# Patient Record
Sex: Female | Born: 1935 | Race: White | Hispanic: No | Smoking: Former smoker
Health system: Southern US, Community
[De-identification: ages and names within clinical notes are randomized; demographics above are authoritative.]

## PROBLEM LIST (undated history)

## (undated) DIAGNOSIS — G319 Degenerative disease of nervous system, unspecified: Secondary | ICD-10-CM

## (undated) DIAGNOSIS — I10 Essential (primary) hypertension: Secondary | ICD-10-CM

## (undated) DIAGNOSIS — J42 Unspecified chronic bronchitis: Secondary | ICD-10-CM

## (undated) DIAGNOSIS — I4891 Unspecified atrial fibrillation: Secondary | ICD-10-CM

## (undated) DIAGNOSIS — I679 Cerebrovascular disease, unspecified: Secondary | ICD-10-CM

## (undated) DIAGNOSIS — C8120 Mixed cellularity classical Hodgkin lymphoma, unspecified site: Principal | ICD-10-CM

## (undated) DIAGNOSIS — K900 Celiac disease: Secondary | ICD-10-CM

## (undated) DIAGNOSIS — D62 Acute posthemorrhagic anemia: Secondary | ICD-10-CM

## (undated) DIAGNOSIS — F419 Anxiety disorder, unspecified: Secondary | ICD-10-CM

## (undated) DIAGNOSIS — I719 Aortic aneurysm of unspecified site, without rupture: Secondary | ICD-10-CM

## (undated) DIAGNOSIS — J479 Bronchiectasis, uncomplicated: Secondary | ICD-10-CM

## (undated) DIAGNOSIS — F411 Generalized anxiety disorder: Secondary | ICD-10-CM

## (undated) DIAGNOSIS — D649 Anemia, unspecified: Secondary | ICD-10-CM

## (undated) DIAGNOSIS — J189 Pneumonia, unspecified organism: Secondary | ICD-10-CM

## (undated) DIAGNOSIS — J449 Chronic obstructive pulmonary disease, unspecified: Secondary | ICD-10-CM

## (undated) HISTORY — PX: CATARACT EXTRACTION: SUR2

## (undated) HISTORY — DX: Chronic obstructive pulmonary disease, unspecified: J44.9

## (undated) HISTORY — DX: Mixed cellularity Hodgkin lymphoma, unspecified site: C81.20

## (undated) HISTORY — DX: Degenerative disease of nervous system, unspecified: G31.9

## (undated) HISTORY — PX: EXCISION MORTON'S NEUROMA: SHX5013

## (undated) HISTORY — DX: Essential (primary) hypertension: I10

## (undated) HISTORY — PX: TOTAL KNEE ARTHROPLASTY: SHX125

## (undated) HISTORY — DX: Generalized anxiety disorder: F41.1

## (undated) HISTORY — DX: Bronchiectasis, uncomplicated: J47.9

## (undated) HISTORY — PX: NASAL SINUS SURGERY: SHX719

## (undated) HISTORY — DX: Acute posthemorrhagic anemia: D62

## (undated) HISTORY — DX: Cerebrovascular disease, unspecified: I67.9

---

## 1998-07-16 ENCOUNTER — Other Ambulatory Visit: Admission: RE | Admit: 1998-07-16 | Discharge: 1998-07-16 | Payer: Self-pay | Admitting: Obstetrics and Gynecology

## 1999-08-12 ENCOUNTER — Encounter: Payer: Self-pay | Admitting: *Deleted

## 1999-08-12 ENCOUNTER — Encounter: Admission: RE | Admit: 1999-08-12 | Discharge: 1999-08-12 | Payer: Self-pay | Admitting: *Deleted

## 1999-12-03 ENCOUNTER — Other Ambulatory Visit: Admission: RE | Admit: 1999-12-03 | Discharge: 1999-12-03 | Payer: Self-pay | Admitting: *Deleted

## 1999-12-05 ENCOUNTER — Encounter (INDEPENDENT_AMBULATORY_CARE_PROVIDER_SITE_OTHER): Payer: Self-pay | Admitting: Specialist

## 1999-12-05 ENCOUNTER — Ambulatory Visit (HOSPITAL_BASED_OUTPATIENT_CLINIC_OR_DEPARTMENT_OTHER): Admission: RE | Admit: 1999-12-05 | Discharge: 1999-12-06 | Payer: Self-pay | Admitting: *Deleted

## 1999-12-16 ENCOUNTER — Encounter: Payer: Self-pay | Admitting: *Deleted

## 1999-12-16 ENCOUNTER — Encounter: Admission: RE | Admit: 1999-12-16 | Discharge: 1999-12-16 | Payer: Self-pay | Admitting: *Deleted

## 2001-06-02 ENCOUNTER — Encounter: Payer: Self-pay | Admitting: *Deleted

## 2001-06-02 ENCOUNTER — Encounter: Admission: RE | Admit: 2001-06-02 | Discharge: 2001-06-02 | Payer: Self-pay | Admitting: *Deleted

## 2001-06-07 ENCOUNTER — Encounter: Admission: RE | Admit: 2001-06-07 | Discharge: 2001-06-07 | Payer: Self-pay | Admitting: *Deleted

## 2001-06-07 ENCOUNTER — Encounter: Payer: Self-pay | Admitting: *Deleted

## 2001-08-13 ENCOUNTER — Encounter: Payer: Self-pay | Admitting: *Deleted

## 2001-08-13 ENCOUNTER — Encounter: Admission: RE | Admit: 2001-08-13 | Discharge: 2001-08-13 | Payer: Self-pay | Admitting: *Deleted

## 2001-09-17 ENCOUNTER — Encounter: Payer: Self-pay | Admitting: *Deleted

## 2001-09-17 ENCOUNTER — Encounter: Admission: RE | Admit: 2001-09-17 | Discharge: 2001-09-17 | Payer: Self-pay | Admitting: *Deleted

## 2002-01-04 ENCOUNTER — Ambulatory Visit (HOSPITAL_COMMUNITY): Admission: RE | Admit: 2002-01-04 | Discharge: 2002-01-04 | Payer: Self-pay | Admitting: Gastroenterology

## 2002-02-14 ENCOUNTER — Other Ambulatory Visit: Admission: RE | Admit: 2002-02-14 | Discharge: 2002-02-14 | Payer: Self-pay | Admitting: *Deleted

## 2002-06-13 ENCOUNTER — Encounter: Admission: RE | Admit: 2002-06-13 | Discharge: 2002-06-13 | Payer: Self-pay

## 2003-05-15 ENCOUNTER — Encounter: Admission: RE | Admit: 2003-05-15 | Discharge: 2003-05-15 | Payer: Self-pay

## 2003-05-17 ENCOUNTER — Ambulatory Visit: Admission: RE | Admit: 2003-05-17 | Discharge: 2003-05-17 | Payer: Self-pay

## 2003-06-27 ENCOUNTER — Encounter: Admission: RE | Admit: 2003-06-27 | Discharge: 2003-06-27 | Payer: Self-pay

## 2003-12-28 ENCOUNTER — Encounter: Admission: RE | Admit: 2003-12-28 | Discharge: 2003-12-28 | Payer: Self-pay | Admitting: Family Medicine

## 2004-01-23 ENCOUNTER — Encounter: Admission: RE | Admit: 2004-01-23 | Discharge: 2004-01-23 | Payer: Self-pay | Admitting: Family Medicine

## 2004-01-25 ENCOUNTER — Encounter: Admission: RE | Admit: 2004-01-25 | Discharge: 2004-01-25 | Payer: Self-pay | Admitting: Family Medicine

## 2004-02-06 ENCOUNTER — Encounter: Admission: RE | Admit: 2004-02-06 | Discharge: 2004-02-06 | Payer: Self-pay | Admitting: Family Medicine

## 2004-06-11 ENCOUNTER — Encounter: Admission: RE | Admit: 2004-06-11 | Discharge: 2004-06-11 | Payer: Self-pay | Admitting: Family Medicine

## 2004-07-03 ENCOUNTER — Ambulatory Visit: Payer: Self-pay | Admitting: Internal Medicine

## 2004-09-09 ENCOUNTER — Encounter: Admission: RE | Admit: 2004-09-09 | Discharge: 2004-09-09 | Payer: Self-pay | Admitting: Family Medicine

## 2005-05-16 ENCOUNTER — Ambulatory Visit: Payer: Self-pay | Admitting: Internal Medicine

## 2005-05-28 ENCOUNTER — Ambulatory Visit: Payer: Self-pay | Admitting: Internal Medicine

## 2005-06-04 ENCOUNTER — Ambulatory Visit: Payer: Self-pay | Admitting: *Deleted

## 2005-08-01 ENCOUNTER — Ambulatory Visit: Payer: Self-pay | Admitting: Internal Medicine

## 2005-08-13 ENCOUNTER — Ambulatory Visit: Admission: RE | Admit: 2005-08-13 | Discharge: 2005-08-13 | Payer: Self-pay | Admitting: Internal Medicine

## 2005-08-13 ENCOUNTER — Encounter (INDEPENDENT_AMBULATORY_CARE_PROVIDER_SITE_OTHER): Payer: Self-pay | Admitting: Specialist

## 2005-08-13 ENCOUNTER — Ambulatory Visit: Payer: Self-pay | Admitting: Internal Medicine

## 2005-09-15 ENCOUNTER — Ambulatory Visit: Payer: Self-pay | Admitting: Internal Medicine

## 2005-10-14 ENCOUNTER — Ambulatory Visit: Payer: Self-pay | Admitting: Internal Medicine

## 2005-10-16 ENCOUNTER — Encounter: Admission: RE | Admit: 2005-10-16 | Discharge: 2005-10-16 | Payer: Self-pay | Admitting: Family Medicine

## 2005-11-26 ENCOUNTER — Other Ambulatory Visit: Admission: RE | Admit: 2005-11-26 | Discharge: 2005-11-26 | Payer: Self-pay | Admitting: Family Medicine

## 2006-02-20 ENCOUNTER — Ambulatory Visit: Payer: Self-pay | Admitting: Internal Medicine

## 2006-06-10 ENCOUNTER — Ambulatory Visit: Payer: Self-pay | Admitting: Internal Medicine

## 2006-06-17 ENCOUNTER — Ambulatory Visit: Payer: Self-pay | Admitting: Internal Medicine

## 2006-07-22 ENCOUNTER — Ambulatory Visit: Payer: Self-pay | Admitting: Internal Medicine

## 2006-09-29 ENCOUNTER — Ambulatory Visit: Payer: Self-pay | Admitting: Pulmonary Disease

## 2006-10-15 ENCOUNTER — Ambulatory Visit: Payer: Self-pay | Admitting: Pulmonary Disease

## 2006-10-19 ENCOUNTER — Encounter: Admission: RE | Admit: 2006-10-19 | Discharge: 2006-10-19 | Payer: Self-pay | Admitting: Family Medicine

## 2006-10-22 ENCOUNTER — Ambulatory Visit: Payer: Self-pay | Admitting: Internal Medicine

## 2007-01-02 ENCOUNTER — Encounter: Admission: RE | Admit: 2007-01-02 | Discharge: 2007-01-02 | Payer: Self-pay | Admitting: Orthopedic Surgery

## 2007-01-29 ENCOUNTER — Inpatient Hospital Stay (HOSPITAL_COMMUNITY): Admission: RE | Admit: 2007-01-29 | Discharge: 2007-02-02 | Payer: Self-pay | Admitting: Orthopedic Surgery

## 2007-07-09 ENCOUNTER — Encounter: Payer: Self-pay | Admitting: Internal Medicine

## 2007-07-09 DIAGNOSIS — J449 Chronic obstructive pulmonary disease, unspecified: Secondary | ICD-10-CM

## 2007-07-09 DIAGNOSIS — R05 Cough: Secondary | ICD-10-CM

## 2007-07-09 DIAGNOSIS — J45909 Unspecified asthma, uncomplicated: Secondary | ICD-10-CM | POA: Insufficient documentation

## 2007-07-09 DIAGNOSIS — I10 Essential (primary) hypertension: Secondary | ICD-10-CM

## 2007-08-05 HISTORY — PX: TOTAL HIP ARTHROPLASTY: SHX124

## 2007-10-25 ENCOUNTER — Encounter: Admission: RE | Admit: 2007-10-25 | Discharge: 2007-10-25 | Payer: Self-pay | Admitting: Family Medicine

## 2007-12-01 ENCOUNTER — Encounter: Admission: RE | Admit: 2007-12-01 | Discharge: 2007-12-01 | Payer: Self-pay | Admitting: Family Medicine

## 2008-03-27 ENCOUNTER — Encounter: Admission: RE | Admit: 2008-03-27 | Discharge: 2008-03-27 | Payer: Self-pay | Admitting: Orthopedic Surgery

## 2008-03-29 ENCOUNTER — Ambulatory Visit (HOSPITAL_BASED_OUTPATIENT_CLINIC_OR_DEPARTMENT_OTHER): Admission: RE | Admit: 2008-03-29 | Discharge: 2008-03-30 | Payer: Self-pay | Admitting: Orthopedic Surgery

## 2008-03-29 ENCOUNTER — Encounter (INDEPENDENT_AMBULATORY_CARE_PROVIDER_SITE_OTHER): Payer: Self-pay | Admitting: Orthopedic Surgery

## 2008-11-13 ENCOUNTER — Encounter: Admission: RE | Admit: 2008-11-13 | Discharge: 2008-11-13 | Payer: Self-pay | Admitting: Family Medicine

## 2008-12-26 ENCOUNTER — Ambulatory Visit (HOSPITAL_COMMUNITY): Admission: RE | Admit: 2008-12-26 | Discharge: 2008-12-26 | Payer: Self-pay | Admitting: Gastroenterology

## 2008-12-28 ENCOUNTER — Ambulatory Visit (HOSPITAL_COMMUNITY): Admission: RE | Admit: 2008-12-28 | Discharge: 2008-12-28 | Payer: Self-pay | Admitting: Gastroenterology

## 2009-01-03 ENCOUNTER — Inpatient Hospital Stay (HOSPITAL_COMMUNITY): Admission: EM | Admit: 2009-01-03 | Discharge: 2009-01-10 | Payer: Self-pay | Admitting: Internal Medicine

## 2009-11-19 ENCOUNTER — Encounter: Admission: RE | Admit: 2009-11-19 | Discharge: 2009-11-19 | Payer: Self-pay | Admitting: Family Medicine

## 2010-08-04 DIAGNOSIS — J479 Bronchiectasis, uncomplicated: Secondary | ICD-10-CM

## 2010-08-04 HISTORY — DX: Bronchiectasis, uncomplicated: J47.9

## 2010-09-26 ENCOUNTER — Ambulatory Visit (INDEPENDENT_AMBULATORY_CARE_PROVIDER_SITE_OTHER)
Admission: RE | Admit: 2010-09-26 | Discharge: 2010-09-26 | Disposition: A | Discharge status: Home / Self Care | Payer: Medicare Other | Source: Ambulatory Visit | Attending: Internal Medicine | Admitting: Internal Medicine

## 2010-09-26 ENCOUNTER — Other Ambulatory Visit: Payer: Self-pay | Admitting: Internal Medicine

## 2010-09-26 ENCOUNTER — Encounter: Payer: Self-pay | Admitting: Internal Medicine

## 2010-09-26 ENCOUNTER — Institutional Professional Consult (permissible substitution) (INDEPENDENT_AMBULATORY_CARE_PROVIDER_SITE_OTHER): Payer: Medicare Other | Admitting: Internal Medicine

## 2010-09-26 DIAGNOSIS — J4489 Other specified chronic obstructive pulmonary disease: Secondary | ICD-10-CM

## 2010-09-26 DIAGNOSIS — J449 Chronic obstructive pulmonary disease, unspecified: Secondary | ICD-10-CM

## 2010-10-01 NOTE — Assessment & Plan Note (Signed)
Summary: Pulmononary/ new pt eval for copd    Visit Type:  Initial Consult Copy to:  Self Primary Provider/Referring Provider:  Dr. [name]  CC:  DOE.  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 minimal am cough, worse in winter, mucoid, better since quit smoking.   Presently Pt denies any significant sore throat, dysphagia, itching, sneezing,  nasal congestion or excess secretions,  fever, chills, sweats, unintended wt loss, pleuritic or exertional cp, hempoptysis, change in activity tolerance  orthopnea pnd or leg swelling Pt also denies any obvious fluctuation in symptoms with weather or environmental change or other alleviating or aggravating factors x xome better on symbicort      Current Medications (verified): 1)  Premarin 0.625 Mg/gm Crea (Estrogens, Conjugated) .... As Directed As Needed 2)  B Complex  Tabs (B Complex Vitamins) .Marland Kitchen.. 1 Once Daily 3)  Vitamin D3 1000 Unit Tabs (Cholecalciferol) .Marland Kitchen.. 1 Once Daily 4)  Atenolol 50 Mg Tabs (Atenolol) .Marland Kitchen.. 1 Once Daily 5)  Symbicort 160-4.5 Mcg/act Aero (Budesonide-Formoterol Fumarate) .... 2 Puffs Two Times A Day 6)  Acetaminophen Pm 500-25 Mg Tabs (Diphenhydramine-Apap (Sleep)) .... 2 At Bedtime As Needed 7)  Centrum Silver  Tabs (Multiple Vitamins-Minerals) .Marland Kitchen.. 1 Once Daily 8)  Arthritis Pain Reliever 650 Mg Cr-Tabs (Acetaminophen) .... 2 Once Daily 9)  Cosamin Ds 500-400 Mg Caps (Glucosamine-Chondroitin) .Marland Kitchen.. 1 Two Times A Day 10)  Vitamin C 500 Mg Tabs (Ascorbic Acid) .Marland Kitchen.. 1 Once Daily 11)  Metamucil 30.9 % Powd (Psyllium) .... 2 Tsp Once Daily 12)  Hydrochlorothiazide 25 Mg Tabs (Hydrochlorothiazide) .Marland Kitchen.. 1 Once Daily 13)  Alprazolam 0.25 Mg Tabs (Alprazolam) .Marland Kitchen.. 1 Every 6 Hrs As Needed 14)  Calcium-Vitamin D  600-200 Mg-Unit Tabs (Calcium-Vitamin D) .Marland Kitchen.. 1 Two Times A Day 15)  Biotin 5000 5 Mg Caps (Biotin) .Marland Kitchen.. 1 Once Daily 16)  Pepto-Bismol 262 Mg Chew (Bismuth Subsalicylate) .... As Directed As Needed  Allergies (verified): 1)  ! 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  The patient denies shortness of breath at rest, productive cough, non-productive cough, coughing up blood, chest pain, irregular heartbeats, acid heartburn, indigestion, loss of appetite, weight change, abdominal pain, difficulty swallowing, sore throat, tooth/dental problems, headaches, nasal congestion/difficulty breathing through nose, sneezing, itching, ear ache, anxiety, depression, hand/feet swelling, joint stiffness or pain, rash, change in color of mucus, and fever.    Vital Signs:  Patient profile:   77 year old female Height:      67 inches Weight:      188.38 pounds BMI:     29.61 O2 Sat:  98 % on Room air Temp:     98.2 degrees F oral Pulse rate:   71 / minute BP sitting:   136 / 80  (left arm)  Vitals Entered By: Vernie Murders (September 26, 2010 2:40 PM)  O2 Flow:  Room air  Physical Exam  Additional Exam:  wt 188 September 26, 2010  thin pleasant amb wf nad HEENT mild turbinate edema.  Oropharynx no thrush or excess pnd or cobblestoning.  No JVD or  cervical adenopathy. Mild accessory muscle hypertrophy. Trachea midline, nl thryroid. Chest was hyperinflated by percussion with diminished breath sounds and moderate increased exp time without wheeze. Hoover sign positive at mid inspiration. Regular rate and rhythm without murmur gallop or rub or increase P2 or edema.  Abd: no hsm, nl excursion. Ext warm without cyanosis or clubbing.     CXR  Procedure date:  09/26/2010  Findings:        Comparison: 03/27/2008   Findings: Lungs are grossly clear.   No pleural effusion or pneumothorax.   Cardiomediastinal silhouette is within normal limits, noting atherosclerotic calcifications of the aortic arch.   Degenerate changes of the visualized thoracolumbar spine.   IMPRESSION: No evidence of acute cardiopulmonary disease.  Impression & Recommendations:  Problem # 1:  COPD (ICD-496) GOLD II copd previously and probably still not more severe  I reviewed the Flethcher curve with her that basically says if you quit smoking when your best day FEV1 is still well preserved it is highly unlikely you will progress to severe disease and informed the patient there was no medication on the market that has proven to change the curve or the likelihood of progression    I took this opportunity to emphasize  the consequences of smoking in airway disorders based on all the data we have from the multiple national lung health studies indicating that smoking cessation, not choice of inhalers or physicians, is the most important aspect of care.    I spent extra time with the patient today explaining optimal mdi  technique.  This improved from  50-75%   Problem # 2:  HYPERTENSION (ICD-401.9)  The following medications were removed from the medication list:    Benicar Hct 40-12.5 Mg Tabs (Olmesartan medoxomil-hctz) ..... Once daily Her updated medication list for this problem includes:    Atenolol 50 Mg Tabs (Atenolol) .Marland Kitchen... 1 once daily     Hydrochlorothiazide 25 Mg Tabs (Hydrochlorothiazide) .Marland Kitchen... 1 once daily  May need to consider Bystolic, the most beta -1  selective Beta blocker available in sample form, with bisoprolol the most selective generic choice  on the market if asthmatic component becomes any more problematic to manage but for now probably ok to stay on atenolol  Medications Added to Medication List This Visit: 1)  Premarin 0.625 Mg/gm Crea (Estrogens, conjugated) .... As directed as needed 2)  B Complex Tabs (B complex vitamins) .Marland Kitchen.. 1 once daily 3)  Vitamin D3 1000 Unit Tabs (Cholecalciferol) .Marland Kitchen.. 1 once daily 4)  Atenolol 50 Mg Tabs (Atenolol) .Marland Kitchen.. 1 once daily 5)  Symbicort 160-4.5 Mcg/act Aero (Budesonide-formoterol fumarate) .... 2 puffs two times a day 6)  Acetaminophen Pm 500-25 Mg Tabs (Diphenhydramine-apap (sleep)) .... 2 at bedtime as needed 7)  Centrum Silver Tabs (Multiple vitamins-minerals) .Marland Kitchen.. 1 once daily 8)  Arthritis Pain Reliever 650 Mg Cr-tabs (Acetaminophen) .... 2 once daily 9)  Cosamin Ds 500-400 Mg Caps (Glucosamine-chondroitin) .Marland Kitchen.. 1 two times a day 10)  Vitamin C 500 Mg Tabs (  Ascorbic acid) .Marland Kitchen.. 1 once daily 11)  Metamucil 30.9 % Powd (Psyllium) .... 2 tsp once daily 12)  Hydrochlorothiazide 25 Mg Tabs (Hydrochlorothiazide) .Marland Kitchen.. 1 once daily 13)  Alprazolam 0.25 Mg Tabs (Alprazolam) .Marland Kitchen.. 1 every 6 hrs as needed 14)  Calcium-vitamin D 600-200 Mg-unit Tabs (Calcium-vitamin d) .Marland Kitchen.. 1 two times a day 15)  Biotin 5000 5 Mg Caps (Biotin) .Marland Kitchen.. 1 once daily 16)  Pepto-bismol 262 Mg Chew (Bismuth subsalicylate) .... As directed as needed  Other Orders: T-2 View CXR (71020TC) New Patient Level V (11914)  Patient Instructions: 1)  Work on inhaler technique:  relax and blow all the way out then take a nice smooth deep breath back in, triggering the inhaler at same time you start breathing in 2)  We may need changing atenolol to a more specific beta blocker either bystolic or bysoprolol. 3)   Please schedule a follow-up appointment in 6 weeks, sooner if needed with pfts   CXR  Procedure date:  09/26/2010  Findings:        Comparison: 03/27/2008   Findings: Lungs are grossly clear.   No pleural effusion or pneumothorax.   Cardiomediastinal silhouette is within normal limits, noting atherosclerotic calcifications of the aortic arch.   Degenerate changes of the visualized thoracolumbar spine.   IMPRESSION: No evidence of acute cardiopulmonary disease.

## 2010-10-21 ENCOUNTER — Other Ambulatory Visit: Payer: Self-pay | Admitting: Family Medicine

## 2010-10-21 DIAGNOSIS — Z1231 Encounter for screening mammogram for malignant neoplasm of breast: Secondary | ICD-10-CM

## 2010-11-06 ENCOUNTER — Encounter: Payer: Self-pay | Admitting: Internal Medicine

## 2010-11-11 ENCOUNTER — Ambulatory Visit (INDEPENDENT_AMBULATORY_CARE_PROVIDER_SITE_OTHER): Payer: Medicare Other | Admitting: Internal Medicine

## 2010-11-11 ENCOUNTER — Encounter: Payer: Self-pay | Admitting: Internal Medicine

## 2010-11-11 DIAGNOSIS — R05 Cough: Secondary | ICD-10-CM

## 2010-11-11 DIAGNOSIS — J449 Chronic obstructive pulmonary disease, unspecified: Secondary | ICD-10-CM

## 2010-11-11 DIAGNOSIS — J4489 Other specified chronic obstructive pulmonary disease: Secondary | ICD-10-CM

## 2010-11-11 DIAGNOSIS — J45909 Unspecified asthma, uncomplicated: Secondary | ICD-10-CM

## 2010-11-11 DIAGNOSIS — I10 Essential (primary) hypertension: Secondary | ICD-10-CM

## 2010-11-11 DIAGNOSIS — R059 Cough, unspecified: Secondary | ICD-10-CM

## 2010-11-11 LAB — DIFFERENTIAL
Basophils Absolute: 0 10*3/uL (ref 0.0–0.1)
Basophils Relative: 0 % (ref 0–1)
Lymphocytes Relative: 7 % — ABNORMAL LOW (ref 12–46)
Monocytes Absolute: 0.4 10*3/uL (ref 0.1–1.0)
Neutro Abs: 11.4 10*3/uL — ABNORMAL HIGH (ref 1.7–7.7)
Neutrophils Relative %: 89 % — ABNORMAL HIGH (ref 43–77)

## 2010-11-11 LAB — CBC
HCT: 31.7 % — ABNORMAL LOW (ref 36.0–46.0)
HCT: 37 % (ref 36.0–46.0)
HCT: 38.3 % (ref 36.0–46.0)
HCT: 40.5 % (ref 36.0–46.0)
Hemoglobin: 12.2 g/dL (ref 12.0–15.0)
Hemoglobin: 12.6 g/dL (ref 12.0–15.0)
Hemoglobin: 13.6 g/dL (ref 12.0–15.0)
MCV: 99.1 fL (ref 78.0–100.0)
MCV: 99.2 fL (ref 78.0–100.0)
MCV: 99.6 fL (ref 78.0–100.0)
Platelets: 185 10*3/uL (ref 150–400)
Platelets: 226 10*3/uL (ref 150–400)
RBC: 3.55 MIL/uL — ABNORMAL LOW (ref 3.87–5.11)
RBC: 3.7 MIL/uL — ABNORMAL LOW (ref 3.87–5.11)
RBC: 4.07 MIL/uL (ref 3.87–5.11)
RDW: 12.7 % (ref 11.5–15.5)
RDW: 12.7 % (ref 11.5–15.5)
WBC: 10.2 10*3/uL (ref 4.0–10.5)
WBC: 10.2 10*3/uL (ref 4.0–10.5)
WBC: 12.8 10*3/uL — ABNORMAL HIGH (ref 4.0–10.5)
WBC: 4.8 10*3/uL (ref 4.0–10.5)
WBC: 5.8 10*3/uL (ref 4.0–10.5)

## 2010-11-11 LAB — OVA AND PARASITE EXAMINATION

## 2010-11-11 LAB — COMPREHENSIVE METABOLIC PANEL
ALT: 17 U/L (ref 0–35)
Alkaline Phosphatase: 44 U/L (ref 39–117)
Alkaline Phosphatase: 47 U/L (ref 39–117)
BUN: 25 mg/dL — ABNORMAL HIGH (ref 6–23)
BUN: 25 mg/dL — ABNORMAL HIGH (ref 6–23)
CO2: 25 mEq/L (ref 19–32)
CO2: 27 mEq/L (ref 19–32)
Chloride: 103 mEq/L (ref 96–112)
Chloride: 106 mEq/L (ref 96–112)
Creatinine, Ser: 1.38 mg/dL — ABNORMAL HIGH (ref 0.4–1.2)
GFR calc non Af Amer: 37 mL/min — ABNORMAL LOW (ref >60)
Glucose, Bld: 114 mg/dL — ABNORMAL HIGH (ref 70–99)
Glucose, Bld: 92 mg/dL (ref 70–99)
Potassium: 4 mEq/L (ref 3.5–5.1)
Potassium: 4.3 mEq/L (ref 3.5–5.1)
Sodium: 140 mEq/L (ref 135–145)
Total Bilirubin: 0.5 mg/dL (ref 0.3–1.2)
Total Bilirubin: 0.5 mg/dL (ref 0.3–1.2)
Total Protein: 5.8 g/dL — ABNORMAL LOW (ref 6.0–8.3)

## 2010-11-11 LAB — MAGNESIUM
Magnesium: 1.3 mg/dL — ABNORMAL LOW (ref 1.5–2.5)
Magnesium: 1.4 mg/dL — ABNORMAL LOW (ref 1.5–2.5)

## 2010-11-11 LAB — FECAL LACTOFERRIN, QUANT

## 2010-11-11 LAB — BASIC METABOLIC PANEL
BUN: 3 mg/dL — ABNORMAL LOW (ref 6–23)
BUN: 4 mg/dL — ABNORMAL LOW (ref 6–23)
BUN: 7 mg/dL (ref 6–23)
Calcium: 8 mg/dL — ABNORMAL LOW (ref 8.4–10.5)
Chloride: 104 mEq/L (ref 96–112)
Creatinine, Ser: 0.81 mg/dL (ref 0.4–1.2)
Creatinine, Ser: 0.82 mg/dL (ref 0.4–1.2)
Creatinine, Ser: 0.92 mg/dL (ref 0.4–1.2)
Creatinine, Ser: 0.92 mg/dL (ref 0.4–1.2)
GFR calc Af Amer: >60 mL/min (ref >60)
GFR calc Af Amer: >60 mL/min (ref >60)
GFR calc Af Amer: >60 mL/min (ref >60)
GFR calc non Af Amer: 60 mL/min — ABNORMAL LOW (ref >60)
GFR calc non Af Amer: >60 mL/min (ref >60)
GFR calc non Af Amer: >60 mL/min (ref >60)
Glucose, Bld: 96 mg/dL (ref 70–99)
Potassium: 2.8 mEq/L — ABNORMAL LOW (ref 3.5–5.1)
Potassium: 3.7 mEq/L (ref 3.5–5.1)
Sodium: 139 mEq/L (ref 135–145)

## 2010-11-11 LAB — HEMOCCULT GUIAC POC 1CARD (OFFICE): Fecal Occult Bld: NEGATIVE

## 2010-11-11 LAB — PULMONARY FUNCTION TEST

## 2010-11-11 LAB — TSH: TSH: 1.064 u[IU]/mL (ref 0.350–4.500)

## 2010-11-11 LAB — CLOSTRIDIUM DIFFICILE EIA: C difficile Toxins A+B, EIA: NEGATIVE

## 2010-11-11 LAB — STOOL CULTURE

## 2010-11-11 NOTE — Assessment & Plan Note (Signed)
GOLD II but significant deterioration while smoking x 5 years  I reviewed the Flethcher curve with patient that basically indicates  if you quit smoking when your best day FEV1 is still well preserved it is highly unlikely you will progress to severe disease and informed the patient there was no medication on the market that has proven to change the curve or the likelihood of progression.  Therefore stopping smoking and maintaining abstinence is the most important aspect of care, not choice of inhalers or for that matter, doctors.    rec maintain complete abstinence, consider spiriva next

## 2010-11-11 NOTE — Progress Notes (Signed)
  Subjective:    Patient ID: [name] [name], female    DOB: 1931/05/21, 77 y.o.   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 minimal am cough, worse in winter, mucoid, better since quit smoking.   11/11/2010 ov cc no change doe,  Still smoking no noct or early am exac or sign cough/ excess mucus.  Pt denies any significant sore throat, dysphagia, itching, sneezing,  nasal congestion or excess/ purulent secretions,  fever, chills, sweats, unintended wt loss, pleuritic or exertional cp, hempoptysis, orthopnea pnd or leg swelling.    Also denies any obvious fluctuation of symptoms with weather or environmental changes or other aggravating or alleviating factors.     Past Medical History:  COPD (ICD-496)  - PFT's 09/15/2005 FEV1 1.69 (76%) with ration67 and better x 14% p B2, DLC 95%  - PFT's  11/11/2010  FEV1 1.32 (63%) with ratio 62 and no better p B2,  DLC0 66% - HFA 90% 11/11/2010  COUGH, CHRONIC (ICD-786.2)  ASTHMA (ICD-493.90)  HYPERTENSION (ICD-401.9)    Past Surgical History:  Post Sinus surgery by DrRedman in 2002  2 remote knee replacements  Morton Neuroma repair on her left foot  Rt hip replaced 2009   Family History:  Heart dz- Father (passed with MI at age 79)  Prostate CA- PGF  PF brother former smoker   Social History:  Widowed  3 children  Works as a Architect  Current smoker- only smokes occ  ETOH- wine and gin daily     .                  Review of Systems     Objective:   Physical Exam       wt 188 September 26, 2010  > 185 11/11/2010  thin pleasant amb wf nad  HEENT mild turbinate edema. Oropharynx no thrush or excess pnd or cobblestoning. No JVD or cervical adenopathy. Mild accessory muscle hypertrophy. Trachea midline, nl thryroid. Chest was  hyperinflated by percussion with diminished breath sounds and moderate increased exp time without wheeze. Hoover sign positive at mid inspiration. Regular rate and rhythm without murmur gallop or rub or increase P2 or edema. Abd: no hsm, nl excursion. Ext warm without cyanosis or clubbing.    Assessment & Plan:  cxr reviewed from 10/25/10 and copd only

## 2010-11-11 NOTE — Assessment & Plan Note (Signed)
Agree with change to   Bystolic, the most beta -1  selective Beta blocker available in sample form, with bisoprolol the most selective generic choice  on the market.

## 2010-11-11 NOTE — Progress Notes (Signed)
PFT done today. 

## 2010-11-11 NOTE — Patient Instructions (Signed)
Work on maintaining perfect inhaler technique:  relax and gently blow all the way out then take a nice smooth deep breath back in, triggering the inhaler at same time you start breathing in.  Hold for up to 5 seconds if you can.  Rinse and gargle with water when done   If your mouth or throat starts to bother you,   I suggest you time the inhaler to your dental care and after using the inhaler(s) brush teeth and tongue with a baking soda containing toothpaste and when you rinse this out, gargle with it first to see if this helps your mouth and throat.      Go ahead and change your blood pressure medication to bisoprolol before we consider adding another medication (Spiriva)  The most important aspect of your care is eliminating all cigarette exposure

## 2010-11-11 NOTE — Progress Notes (Deleted)
  Subjective:    Patient ID: [name] [name], female    DOB: 09/28/1930, 77 y.o.   MRN: [id number]  HPI    Review of Systems     Objective:   Physical Exam        Assessment & Plan:

## 2010-11-25 ENCOUNTER — Ambulatory Visit: Payer: Medicare Other

## 2010-11-26 ENCOUNTER — Encounter: Payer: Self-pay | Admitting: Internal Medicine

## 2010-12-09 ENCOUNTER — Ambulatory Visit
Admission: RE | Admit: 2010-12-09 | Discharge: 2010-12-09 | Disposition: A | Discharge status: Home / Self Care | Payer: Medicare Other | Source: Ambulatory Visit | Attending: Family Medicine | Admitting: Family Medicine

## 2010-12-09 DIAGNOSIS — Z1231 Encounter for screening mammogram for malignant neoplasm of breast: Secondary | ICD-10-CM

## 2010-12-17 NOTE — Op Note (Signed)
NAME:  [name] [name], [name] [name]                  ACCOUNT NO.:  [id number]   MEDICAL RECORD NO.:  [id number]          PATIENT TYPE:  AMB   LOCATION:  DSC                          FACILITY:  [facility]   PHYSICIAN:  Leonides Grills, M.D.     DATE OF BIRTH:  June 12, 1931   DATE OF PROCEDURE:  03/29/2008  DATE OF DISCHARGE:                               OPERATIVE REPORT   PREOPERATIVE DIAGNOSES:  1. Right hallux valgus.  2. Right second hammertoe.  3. Right third web space Morton neuroma.   POSTOPERATIVE DIAGNOSES:  1. Right hallux valgus.  2. Right second hammertoe.  3. Right third web space Morton neuroma.   OPERATION:  1. Right modified McBride bunionectomy.  2. Right great toe digital nerve neurolysis.  3. Excision right third web space Morton neuroma.  4. Right second toe MTP joint dorsal capsulotomy with cord release.  5. Right second toe proximal phalanx head resection.  6. Right second toe FDL to proximal phalanx tendon transfer.  7. Right second toe EDB to EPL tendon transfer.   ANESTHESIA:  General with block.   SURGEON:  Leonides Grills, MD   ASSISTANT:  Richardean Canal, PA-C   ESTIMATED BLOOD LOSS:  Minimal.   TOURNIQUET TIME:  Approximately 1 hour and 10 minutes.   COMPLICATIONS:  None.   DISPOSITION:  Stable to the PR.   INDICATIONS:  This is a 77 year old female who has had longstanding  right forefoot pain that was interfering with her life which the  condition was secondary to the above pathology.  She was consented for  the above procedure.  All risks of infection, neurovascular injury,  persistent pain, worsening pain, prolonged recovery, stiffness,  arthritis, recurrence of the neuroma, and possibility of re-excision as  well as she was told the possible development of hallux varus and  recurrence of hallux valgus were all explained.  Questions were  encouraged and answered.   OPERATION:  The patient was brought to the operating room and placed in  supine position.  After  adequate general endotracheal tube anesthesia  was administered as well as Ancef 1 gram IV piggyback, the right lower  extremity was then prepped and draped in sterile manner.  Over a  proximally based thigh tourniquet, the limb was gravity exsanguinated  and tourniquet was elevated to 290 mmHg.  A longitudinal incision  midline over the medial aspect of the right great toe MTP joint was then  made.  Dissection was carried down through the skin.  Hemostasis was  obtained.  The dorsomedial digital nerve was then carefully identified  and a formal digital nerve neurolysis was then performed.  Once this was  mobilized and retracted out of harm's way, an L-shaped capsulotomy was  then made.  Simple bunionectomy was then performed with a sagittal saw.  Rocky Link Johnson's ridge was then rounded off with a rongeur.  Joint area was  copiously irrigated with normal saline.  Lateral capsule was then  released with curved Beaver blade.  This had excellent release of tight  capsule.  We then repaired the capsule with 2-0 Vicryl  stitch by  advancing both superiorly and proximally and this had an outstanding  repair.  We then obtained x-rays that showed that the sesamoids were  well located.  Range of motion of the great toe MTP joint was excellent.  We elected not to perform the Akin osteotomy due to the fact that by  doing the modified McBride bunionectomy, this allowed [place] to create  enough room for the second toe reconstruction.  We then made a  longitudinal incision over the dorsal aspect of the right third web  space.  Dissection was carried down the through skin.  Hemostasis was  obtained.  Metatarsal spreader was then placed.  Transverse metatarsal  ligament was then incised.  We then delivered the neuroma which was  quite large through the wound by plantar digital pressure.  We cut each  digital branch to the third and fourth toes respectively and carefully  dissected out the neuroma to about 2 cm  proximal to the metatarsal head.  This was then cut and the nerve was sent to pathology.  The area was  copiously irrigated with normal saline.  We then made a longitudinal  incision over the dorsal aspect of the right second toe.  Dissection was  carried down through skin.  Hemostasis was obtained.  The EDB and EDL  tendons were identified.  The EDL tendon was tenotomized proximal medial  and the brevis distal lateral and retracted out of harm's way for later  transfer.  An MTP joint dorsal capsulotomy with collateral release was  then performed with a 15 blade scalpel.  This had an excellent release  of the tight capsule.  We then skeletonized the distal aspect of the  proximal phalanx.  Then with a rongeur the head was then removed.  This  cut was made perpendicular to long axis of proximal phalanx.  A  longitudinal incision was then made into the plantar plate of the PIP  joint.  The FDL tendon was then identified and tenotomized as distal as  possible.  We then made a drill hole using 2.5 followed by 3.5 mm drill  into the base of the proximal phalanx and the FDL tendon was transferred  to the proximal phalanx through this drill hole.  Using 3-0 PDS stitch,  this had an outstanding repair.  We then placed a 0.045 K-wire antegrade  through middle distal phalanx, reduced the PIP joint, and then fired  this retrograde with the toe held in reduced position and tension on the  FDL tendon through the drill hole.  This had an Conservation officer, historic buildings.  We  then completed the transfer of the EDB to EDL tendon dorsally and  reconstructed with 3-0 PDS stitch.  This again had an outstanding repair  and this was sewn to the stump dorsally of the FDL tendon recreating  extension expansion unit.  The area was copiously irrigated with normal  saline.  Tourniquet was deflated.  Hemostasis was obtained.  K-wire was  bent, cut, and capped.  Skin relieving incision was made on either side  of the K-wire.   There was no pulsatile bleeding.  Subcu was closed with  3-0 Vicryl.  Skin was closed with 4-0 nylon.  Over all wounds sterile  dressing was applied and a [employer] dressing was applied.  Hard sole  shoe was applied.  The patient was stable to PR.      Leonides Grills, M.D.  Electronically Signed     PB/MEDQ  D:  03/29/2008  T:  03/30/2008  Job:  [id number]

## 2010-12-17 NOTE — Discharge Summary (Signed)
NAME:  [name] [name], [name] [name]                  ACCOUNT NO.:  [id number]   MEDICAL RECORD NO.:  [id number]          PATIENT TYPE:  INP   LOCATION:  1502                         FACILITY:  [facility]   PHYSICIAN:  Hollice Espy, M.D.DATE OF BIRTH:  May 06, 1931   DATE OF ADMISSION:  01/03/2009  DATE OF DISCHARGE:  01/10/2009                               DISCHARGE SUMMARY   PRIMARY CARE PHYSICIAN:  Gretta Arab. Valentina Lucks, M.D.   CONSULTATIONSAnselmo Rod, M.D., Gastroenterology.   ADDENDUM:  This addendum will cover events from January 09, 2009 to January 10, 2009.  On January 09, 2009, the patient was continuing to have some  episodes of severe nausea and vomiting.  Dr. Loreta Ave was concerned about  the possibility of delayed gastric outlet and she ordered a gastric  emptying study.  This did show some significant delay.  Dr. Loreta Ave started  the patient on Reglan 5 t.i.d.  The patient has responded well.  She was  feeling somewhat weak on the morning of January 10, 2009.  However after  having a meal, she was starting to feel better.  She has been ambulating  some.  The plan will be to monitor her and discharge her after lunch  today.   NEW MEDICATIONS:  Reglan 5 mg p.o. t.i.d.   DISCHARGE DIAGNOSIS:  Delayed gastric emptying.      Hollice Espy, M.D.  Electronically Signed     SKK/MEDQ  D:  01/10/2009  T:  01/10/2009  Job:  [id number]   cc:   Gretta Arab. Valentina Lucks, M.D.  Fax: [phone]   BMWUXL KGM WNUU, M.D.  Fax: [phone]

## 2010-12-17 NOTE — H&P (Signed)
NAME:  [name], [name]                  ACCOUNT NO.:  [id number]   MEDICAL RECORD NO.:  [id number]          PATIENT TYPE:  INP   LOCATION:  1502                         FACILITY:  [facility]   PHYSICIAN:  Michiel Cowboy, MDDATE OF BIRTH:  Aug 27, 1930   DATE OF ADMISSION:  01/03/2009  DATE OF DISCHARGE:                              HISTORY & PHYSICAL   PRIMARY CARE PHYSICIAN:  Gretta Arab. Valentina Lucks, M.D.   GI PHYSICIAN:  Anselmo Rod, M.D.   CHIEF COMPLAINT:  Diarrhea and nausea.   The patient is a 77 year old female with history of hypertension and  irritable bowel syndrome who started to develop diarrhea and nausea and  vomiting since May 8  which she attributes to eating some unwell cooked  meat.  She had this pretty much unrelenting ever since.  Today she had a  sigmoidoscopy done by Dr. Loreta Ave.  Results of this are not back yet but  she was noted to be fairly  dehydrated on orthostatics.  Her creatinine  today is 1.6 at which point the patient was admitted as a direct admit  per request of Dr. Loreta Ave to [facility].  Unfortunately, I do not have  any labs pertaining to her diarrhea available to me.  The patient states  that she has submitted stool cultures which she said she did but I do  not see results so I do not know if she was tested for C. diff.  Today  she was started on ciprofloxacin  500 mg  p.o. twice a day by Dr. Loreta Ave.  Otherwise the patient's review of systems - no fevers, no chills.  No  chest pain or shortness of breath.  She is still working and very active  but lately has been feeling very run down and very tired and dehydrated.   Otherwise review of systems unremarkable.   PAST MEDICAL HISTORY:  1. Significant for IBS, mostly constipation type.  2. Hypertension.  3. Seasonal allergies.  4. Menopause.  5. Arthritis.   SOCIAL HISTORY:  Patient used to smoke but quit a few years back.  Drinks about two alcoholic drinks per day.   ALLERGIES:  MORPHINE.   MEDICATIONS:  1. Flonase 1 spray each nostril daily.  2. Premarin vaginal cream,  use once in 5 weeks or so.  3. Multivitamin.  4. Tylenol PM, takes it every night.  5. Glucosamine with chondroitin.  6. Vitamin B.  7. Benicar/ hydrochlorothiazide  40/25 mg daily.  8. Zofran.  She may use it as needed for nausea with no good results.  9. Ciprofloxacin just started today, 500 mg p.o. twice a day; take for      10 days.  10.Lomotil twice as needed.  11.__________  over-the-counter p.r.n.   PHYSICAL EXAMINATION:  VITALS:  Pulse 105, respirations 18, blood  pressure 114/52, saturating 94% on room air.  The patient is  orthostatic.  The patient appears to be in no acute distress.  HEAD:  Nontraumatic.  Dry mucous membranes.  Decreased skin turgor.  LUNGS:  Clear to auscultation bilaterally.  HEART:  Regular rate  and rhythm.  Somewhat rapid but no murmurs  appreciated.  ABDOMEN:  Soft, nontender, nondistended.  LOWER EXTREMITIES: Without clubbing, cyanosis or edema.  NEUROLOGIC:  Grossly intact.  SKIN:  Grossly intact.   LABS FROM [facility] OFFICE TODAY:  Sodium 140, potassium 4.9, creatinine  1.6,  white blood cell count 6.9, hemoglobin 11.2.   EKG showed normal sinus rhythm, heart rate of 99; no ischemic changes.   ASSESSMENT/PLAN:  This is a 77 year old female with prolonged diarrhea  and nausea and currently without vomiting with dehydration.  1. Nausea/ diarrhea.  Etiology is not quite clear.  The patient has      dealt with it for almost a month now.  I appreciate GI consult.      Since I am unsure what studies have been done so far, will send for      C. diff.., stool culture, ova and parasite, stool white blood cell      count. Lomotil does not seem to be helpful.  Consider Questran.  IV      Zofran for now.  If not helpful,  try maybe low dose  Phenergan.  2. Dehydration.  Give IV fluids for orthostatics.  3. Acute renal failure, mild likely secondary to dehydration.   Will      give her fluids and follow creatinine.  4. History of hypertension.  Currently somewhat on the low side.  Will      hold Benicar and hydrochlorothiazide.  5. Prophylaxis with Protonix and SCDs.   CODE STATUS:  The patient wished to reduce her living will and wants to  be DNR/DNI.  This was explained to her and discussed with her in detail.  She stated she understood and wants to be DNR/DNI at this point.      Michiel Cowboy, MD  Electronically Signed     AVD/MEDQ  D:  01/03/2009  T:  01/03/2009  Job:  [id number]   cc:   Anselmo Rod, M.D.  Fax: [phone]   Gretta Arab Valentina Lucks, M.D.  Fax: [phone]

## 2010-12-17 NOTE — Discharge Summary (Signed)
NAME:  [name] [name], [name] [name]                  ACCOUNT NO.:  [id number]   MEDICAL RECORD NO.:  [id number]          PATIENT TYPE:  INP   LOCATION:  1502                         [facility]ACILITY:  [facility]   PHYSICIAN:  Hollice Espy, M.D.DATE OF BIRTH:  May 31, 1931   DATE OF ADMISSION:  01/03/2009  DATE OF DISCHARGE:  01/09/2009                               DISCHARGE SUMMARY   PRIMARY CARE PHYSICIAN:  Dr. [name].   CONSULTANTS:  Dr. Charna Elizabeth, Gastroenterology.   DISCHARGE DIAGNOSES:  1. Infectious colitis, now resolving.  2. Dehydration.  3. Nausea, vomiting secondary to #1.  4. History of hypertension.  5. Diarrhea secondary to #1.  6. History of arthritis.   DISCHARGE MEDICATIONS:  Discharge medications for this patient are as  follows:  Questran 2 grams one p.o. b.i.d. x2 weeks.  This is a new medicine.  The  patient will also continue on the rest of her previous medicines.  These  are as follows:  1. Flonase one spray each nostril daily.  2. Premarin vaginal topically once every 5 weeks.  3. Multivitamin p.o. daily.  4. Tylenol PM p.o. q.h.s.  5. Glucosamine and chondroitin multivitamin one pill b.i.d.  6. Vitamin B complex one pill daily.  7. Benicar/HCTZ 40/25 p.o. daily.  8. Zofran 4 mg one p.o. q.8 hours p.r.n.  9. Patient improved on Cipro.  This medication is completed.  10.Lomotil 1-2 tablets p.o. b.i.d. p.r.n. diarrhea.  11.Flora-Q one p.o. daily.   HOSPITAL COURSE:  The patient is a 77 year old white female with past  medical history of irritable bowel syndrome and hypertension who prior  to admission has had problems with nausea, vomiting and diarrhea for the  past 3-4 weeks.  She had had a sigmoidoscopy done by Dr. Loreta Ave and on the  day of admission, however, when she came in, she was noted to be quite  dehydrated.  Dr. Loreta Ave had contacted [facility].  I thought the  patient had been quite dehydrated.  She had been placed on Cipro that  same day by Dr. Loreta Ave,  and had come in.  The patient over the next few  days had cultures sent for C diff which were negative.  She was placed  on IV fluids.  Placed on a soft bland diet that eventually was upgraded  to a [employer]  Over the next few days, her symptoms started to improve  with symptomatic control.  Stool cultures and studies came back which  were negative.  Her bowel movements became more and more solid, and by  June 7, she was starting to do well, still feeling very weak.  Her diet  was advanced to solid food diet, and pending improvement, and as long as  she is able to tolerate solid food diet times 24 hours, she will be able  to be discharged home on January 09, 2009.   DISPOSITION:  Her overall disposition is improved.   DISCHARGE INSTRUCTIONS:  1. Activity will be slowly increased.  2. Discharge diet with a low-sodium diet.   FOLLOWUP:  She will follow up with  Dr. Loreta Ave in 2 weeks time and her PCP  Dr. [name] in 3-4 weeks time.      Hollice Espy, M.D.  Electronically Signed     SKK/MEDQ  D:  01/08/2009  T:  01/08/2009  Job:  [id number]   cc:   Anselmo Rod, M.D.  Fax: [phone]   Gretta Arab Valentina Lucks, M.D.  Fax: [phone]

## 2010-12-17 NOTE — Discharge Summary (Signed)
NAME:  Herrin, Rakhi                  ACCOUNT NO.:  [id number]   MEDICAL RECORD NO.:  [id number]          PATIENT TYPE:  INP   LOCATION:  5038                         FACILITY:  [facility]   PHYSICIAN:  John L. Rendall, M.D.  DATE OF BIRTH:  11-07-1930   DATE OF ADMISSION:  01/29/2007  DATE OF DISCHARGE:  02/02/2007                               DISCHARGE SUMMARY   ADMISSION DIAGNOSES:  1. End-stage osteoarthritis, right hip.  2. Hypertension.  3. History of coronary artery disease.  4. Asthmatic bronchitis.   DISCHARGE DIAGNOSES:  1. End-stage osteoarthritis, right hip, status post right total hip      arthroplasty.  2. Acute blood loss anemia secondary to surgery.  3. Hypertension.  4. Anxiety.  5. History of coronary artery disease.  6. Asthmatic bronchitis.   SURGICAL PROCEDURE:  On January 29, 2007, Ms. Wallen underwent a right  total hip arthroplasty by Dr. Jonny Ruiz L. Rendall assisted by Arnoldo Morale,  P.A.-C. She had a DePuy Pinnacle 100 series acetabular cup size 52 mm,  with an apex hole eliminator and then a Pinnacle Marathon acetabular  liner lipped 52-mm outer diameter/32-mm inner diameter. An AML large  stature high offset size 13.5 femoral stem with an Articul/eze femoral  head 32-mm +1 neck length 12.4 cone.   COMPLICATIONS:  None.   CONSULTS:  1. Physical therapy consult on January 30, 2007.  2. Occupational therapy consult January 31, 2007.  3. Case management consult February 01, 2007.   HISTORY OF PRESENT ILLNESS:  This 77 year old white female patient  presented to Dr. [name] with history of bilateral total knees by Dr.  [name] in the past. She has had a three-month history of sudden onset  progressive right hip pain. Pain is now a constant ache to stab  sensation in the right buttock, trochanter and groin. Does radiate into  the knee. Pain increases if she turns her leg or gets up and down and  then also decreases with Vicodin. She cannot sleep on that right side.  She is  having difficulty putting on her socks and shoes. She has failed  conservative treatment, and x-rays show end-stage arthritic changes of  the hip. Because of this, she is presenting for a right hip replacement.   HOSPITAL COURSE:  Ms. Lubinski tolerated her surgical procedure well  without immediate postoperative complications. She was transferred to  5000. Postoperative day #1, she was afebrile. Vitals stable. Hemoglobin  10, hematocrit 29.2. Leg was neurovascularly intact. She had some mild  hyponatremia and mild hyperkalemia, and that was treated. She was  switched to p.o. pain medications and started on therapy per protocol.   On postoperative day #2, she was feeling better. T-max 98.2. Hemoglobin  and hematocrit stable 10 and 29.3. Right hip wound was well approximated  with small amount of drainage. Leg was neurovascularly intact. She was  continued on therapy.   On postoperative day #3, she was complaining of some dizziness when out  of bed. Her hemoglobin and hematocrit were 8.4 and 24.8. She was  subsequently transfused with 1 unit of packed  red blood cells. She  tolerated that well. She did develop some anxiety about her discharge  plan, and her blood pressure was elevated.   On postoperative day #4, she continues to have elevated blood pressure  at 165/101. Her blood pressure medications were switched due to protocol  while in the hospital. She will be resumed back on her normal Benicar.  That will be monitored. She is having some difficulty with anxiety, and  that is treated with relaxation techniques. She is to continue with  physical therapy and is doing well with that. It is felt she is ready  for transfer to the skilled facility when a bed is available and  hopefully will be transferred later today.   DISCHARGE INSTRUCTIONS:  DIET:  She can resume a regular diet.   MEDICATIONS:  1. Colace 100 mg p.o. b.i.d.  2. Senokot 1 tablet p.o. b.i.d. a.c. and can switch that to  p.r.n.      constipation.  3. Arixtra 2.5 mg subcutaneous at 8 a.m. with the last dose to be on      February 08, 2007.  4. Benicar HCT 40/12.5 mg 1 tablet p.o. q.a.m.  5. Nasonex nasal spray 1 squirt in each nostril q.a.m.  6. Multivitamin 1 tablet p.o. q.a.m.  7. Celebrex 200 mg p.o. b.i.d. - continue this for 1 week and then can      drop down to 1 a day.  8. Percocet 1 to 2 tablets p.o. q.4h. p.r.n. for pain.  9. Robaxin 500 mg 1 to 2 tablets p.o. q.6h. p.r.n. for spasms.  10.Tylenol 325 to 650 mg q.4h. p.r.n. temperature greater than 101.5.  11.Ambien 5 mg p.o. every night p.r.n. insomnia.  12.She can continue her calcium and magnesium and zinc complex 1 in      the morning and also her Super B 1 tablet p.o. q.a.m.   ACTIVITY:  She can be out of bed weight bearing as tolerated on the  right leg with use of a walker. She is to have physical therapy per  rehab protocol for the total hip. She is to follow posterior hip  precautions.   WOUND CARE:  Please clean the right hip incision with Betadine. The  Mepilex can stay on probably for about two days before requiring change.  If there is no drainage from the wound, it can be left open to air. It  can be cleaned with Betadine once a day. Please notify Dr. [name] of  temperature greater than 101.5, chills, pain unrelieved by pain  medications or foul-smelling drainage from the wound.   FOLLOWUP:  Ms. Payano needs to follow up with Dr. [name] in our office  on Tuesday, February 16, 2007. Please call [phone] for that appointment. If  her incision is well healed, her staples can be removed with Steri-  Strips with Benzoin applied on Friday, February 12, 2007. If there are any  questions about the wound appearance at that time, please leave the  staples in and set up a return visit with one of the physicians in the  office at that time because Dr. [name] will be out of town.  LABORATORY DATA:  X-ray taken of the right hip taken on June 27  showed  the prosthesis in good position. Urinalysis on June 24 showed 60,000  colonies per mL of multiple species. UA showed 0 to 2 white and red  cells, few bacteria, small leukocyte esterase. All other indices within  normal limits. Hemoglobin and  hematocrit have ranged from 12.9 and 30 on  June 24 to a low of 8.4 and 24.8 on the 30th to 8.8 and 25.7 later on  the 30th. White count has ranged from 7.2 on the 24th to 6.1 on the  30th. Sodium dropped to a low of 133 on the 24th. Potassium went to a  high of 5.4 on the 28th and then dropped back to within normal limits.  BUN and creatinine were 5 and 0.49 on the 29th. Glucose ranged from 101  on the 24th to 126 on the 28th.   Calcium dropped to a low of 8.1 on the 28th. All other laboratory  studies were within normal limits.      Legrand Pitts Duffy, P.A.      John L. Rendall, M.D.  Electronically Signed    KED/MEDQ  D:  02/02/2007  T:  02/02/2007  Job:  [id number]

## 2010-12-17 NOTE — Op Note (Signed)
NAME:  [name] [name], [name] [name]                  ACCOUNT NO.:  [id number]   MEDICAL RECORD NO.:  [id number]          PATIENT TYPE:  INP   LOCATION:  2899                         FACILITY:  [facility]   PHYSICIAN:  John L. Rendall, M.D.  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   SURGICAL PROCEDURES:  Right AML total hip replacement.   POSTOPERATIVE DIAGNOSIS:  Right AML total hip replacement.   SURGEON:  John L. Rendall, M.D.   ASSISTANT:  Legrand Pitts. Duffy, P.A.-C   ANESTHESIA:  General.   PATHOLOGY:  The patient had an MRI showing a small area of aseptic  necrosis with collapse plus arthritic changes in the right hip.  She has  excruciating pain resistant to conservative measures including cortisone  injection of the hip.   PROCEDURE:  Under general anesthesia the patient was placed in the left  lateral decubitus position and the hip was prepared with DuraPrep and  draped as a sterile field.  Kefzol was given prophylactically as well as  postoperatively for 3 doses.  After routine prep and drape, a posterior  approach was made using approximately a 6-7 inch incision.  Dissection  was carried down through the IT band; and the deep Charnley retractor  was inserted as there was a 2-1/2 inch subcutaneous layer.   The hip was then internally rotated and the short external rotators and  hip capsule were taken down from bone with electrocautery.  Careful  dissection of the external rotators off the hip capsule was then done  with a Cobb elevator; and the hip capsule was then opened in a T-shaped  manner.  The hip was dislocated.  The superior femoral neck is exposed  and debrided the IM initiator and canal finder are used.  Progressive  reaming was then done to a size 13 reamer.  The femoral neck is then  osteotomized about 1.5 cm  above the lesser trochanter.  According to  templating, a large size rasp would fit nicely; and indeed the 10.5 rasp  filled the triangle reasonably well.  The 12 gave a good fit, and seated  well and the 13.5, again seated nicely.  Calcar reaming was not required  as the cut was dead on.   At this point, the rasp was removed from the canal and the acetabulum  was exposed with two cobra retractors inferiorly.  The hip capsule was  peeled off the labrum and two wing retractors were inserted between the  capsule, and the labrum over the top of the acetabulum.  The labrum was  then excised; and the ligamentum teres, the acetabulum, was then  progressively reamed 45, 47, 49, 50, 51, and a 52; 100-series cup was  inserted using the Sputnik external guide to assist in appropriate  angle.  This was an excellent fit of the acetabulum in the natural  acetabulum.   A trial seating of a poly for a 32-head was  then inserted; and high  offset femur with a +1-1/2 neck length was then used.  The hip was  stable through full normal range of motion including 90 degrees flexion,  35 degrees abduction and full extension and internal and external  rotation.  Leg length was judged equal comparing knees in the flexed  position after trial, and before surgery.  Once this was all determined,  permanent components were obtained.  The apex hole eliminator was  inserted.  The cross-linked polyethylene was then used.  The marathon  liner outside diameter 52, inside 32, and then the femoral component  13.5, high offset, large, was then inserted and the 32 mm, +1 hip ball.  With all of the parts put together, the hip was stable through a full  range of motion; and the capsule was then closed with #1 Tycron  piriformis and short external rotators reattached similarly.  The hip  was then abducted slightly and the IT band closed with #1 Tycron, #1  Vicryl, 2-0 Vicryl, and skin clips.   Operative time an hour and 10  minutes.   The patient tolerated the procedure well.  Blood loss less than 200 mL.  The patient returned to recovery in good condition.  It should be noted  an excellent bleeding bone bed was encountered in the acetabulum; and  bone quality in the femur was likewise good.      John L. Rendall, M.D.  Electronically Signed     JLR/MEDQ  D:  01/29/2007  T:  01/29/2007  Job:  [id number]

## 2010-12-20 NOTE — Procedures (Signed)
[place]  Patient:    [facility] [place] C Visit Number: [id number] MRN: [id number]          Service Type: END Location: ENDO Attending Physician:  Charna Elizabeth Dictated by:   Anselmo Rod, M.D. Proc. Date: 01/04/02 Admit Date:  01/04/2002 Discharge Date: 01/04/2002   CC:         Heather Roberts, M.D.   Procedure Report  DATE OF BIRTH:  [birth year].  PROCEDURE:  Colonoscopy.  ENDOSCOPIST:  Anselmo Rod, M.D.  INSTRUMENT USED:  Olympus video colonoscope.  INDICATION FOR PROCEDURE:  A 77 year old white female with a history of ulcerative colitis, recent change in bowel habits with mucoid stools.  Rule out UC flare.  PREPROCEDURE PREPARATION:  Informed consent was procured from the patient. The patient was fasted for eight hours prior to the procedure and prepped with a bottle of magnesium citrate and a gallon of NuLytely the night prior to the procedure.  PREPROCEDURE PHYSICAL:  VITAL SIGNS:  The patient had stable vital signs.  NECK:  Supple.  CHEST:  Clear to auscultation.  S1, S2 regular.  ABDOMEN:  Soft with normal bowel sounds.  DESCRIPTION OF PROCEDURE:  The patient was placed in the left lateral decubitus position and sedated with 80 mg of Demerol and 8 mg of Versed intravenously.  Once the patient was adequately sedate and maintained on low-flow oxygen and continuous cardiac monitoring, the Olympus video colonoscope was advanced from the rectum to the cecum with slight difficulty. The patient had evidence of pandiverticular disease with more prominent changes in the left colon.  The procedure was complete up to the cecum.  The mucosa otherwise appeared healthy and without lesions.  IMPRESSION:  Pandiverticulosis, left more than right.  RECOMMENDATIONS: 1. Continue present medications. 2. Outpatient follow-up in the next seven to 10 days. 3. Repeat colorectal cancer screening in the next five years unless the    patient were to develop any abnormal symptoms in the interim. Dictated by:   Anselmo Rod, M.D. Attending Physician:  Charna Elizabeth DD:  01/05/02 TD:  01/07/02 Job: [id number]

## 2010-12-20 NOTE — Op Note (Signed)
[place]  Patient:    [name] [name], [name] [name]                         MRN: [id number] Proc. Date: 12/05/99 Adm. Date:  [id number] Disc. Date: [id number] Attending:  Carlena Sax CC:         Veverly Fells. Arletha Grippe, M.D.             Heather Roberts, M.D.                           Operative Report  PREOPERATIVE DIAGNOSES: 1. Bilateral chronic ethmoid sinusitis. 2. Bilateral chronic maxillary sinusitis. 3. Bilateral chronic frontal sinusitis. 4. Nasal polyposis.  POSTOPERATIVE DIAGNOSES: 1. Bilateral chronic ethmoid sinusitis. 2. Bilateral chronic maxillary sinusitis. 3. Bilateral chronic frontal sinusitis. 4. Nasal polyposis.  PROCEDURES PERFORMED: 1. Bilateral endoscopic total ______ . 2. Bilateral endoscopic maxillary antrostomies. 3. Bilateral endoscopic frontal auriasis explorations.  INSTRUMENT USED:  Insta-Trak system for sterotatic appearances and navigation.  SURGEON:  Veverly Fells. Arletha Grippe, M.D.  ANESTHESIA:  General endotracheal.  INDICATIONS FOR SURGERY:  This is a 77 year old white female who has a long history of recurrent sinusitis.  She mainly complains of nasal congestion, colored anterior nasal drainage and periorbital pressure of thick posterior nasal drainage.  She has been on numerous courses of antibiotics by mouth.  I did treat her aggressively with a prolonged course of antibiotics and medical therapy.  Post-treatment CT scan of her sinuses did reveal findings consistent with bilateral ethmoid, frontal and maxillary sinusitis, with possible nasal polyposis.  Based on her history, physical examination and failure of aggressive medical therapy, I have recommended proceeding with the above-noted surgical procedures.  I have discussed extensively with her the risks and benefits of the surgery, including: risks of general anesthesia, infection, bleeding, overall CNS injury and the normal recovery period after this type of surgery. I  have entertained any questions and answered them appropriately.  An informed consent has been obtained.  The patient will be sent to undergo the above-noted procedure.  OPERATIVE FINDINGS:  Polypoid changes involving the ethmoid area and frontal auriasis cavity.  DETAILS OF THE PROCEDURE PERFORMED:  The patient was brought into the operating room and placed in the supine position.  General endotracheal anesthesia was administered via the anesthesiologist without complications. The patient was administered 1 g Ancef IV x 1, and 10 mg Decadron IV x 1.  The head of the table was elevated 30 degrees.  A throat pack was placed in her posterior pharynx using a small baby lap sponge. Bilateral sphenopalatine blocks were administered via the greater palatine foramen, each with 1.5 cc of a 1% lidocaine solution with 1:100,000 epinephrine.  The patients face was draped in standard fashion.  Using rigid endoscopic guidance, the anterior portion of both middle turbinates and uncinate processes were injected with a 1% lidocaine solution with 1:100,000 epinephrine.  Both middle meatus were packed with some cotton I had soaked in a 4% cocaine solution.  These were left in place for approximately 5-10 min and then removed.  Next, the Insta-Trak headpiece was placed on the patients head.  This was calibrated to the straight suction aspirator.  This was used throughout the entire case to identify the landmarks, such as lamina papyracea, and skull base, which were not violated at any time.  Next, the attention was turned to the left nasal chamber.  Using 0 degree  rigid endoscopic guidance, the uncinate process was backed elevated using a ______  elevator.  An uncinectomy was performed using a combination of the microdebrider and through-cutting forceps.  The antral sinus ostium of the maxillary sinus was identified.  It was enlarged using a microdebrider without difficulty.  A 30- and then a 70-degree  rigid endoscope was then used to identify the contents of the maxillary sinus; there was some thickened mucosa but no other lesions or masses were noted, and no further biopsies were taken.  Next, using the microdebrider, 0-degree telescope and the Insta-Trak system for guidance, an anterior-posterior foraminectomy was performed from the anterior-posterior fashion, not to violate the skull base from the laminae papyracea.  Gross polypoid disease was removed from this area using the microdebrider.  Frontal recess area was identified and was confirmed in the Insta-Trak system using a 30-degree telescope.  Polypoid changes were removed with the microdebrider.  Curved suction catheter was then used to suck out a significant amount of mucopurulent material from the frontal recess area under direct visualization without difficulty.  Next, the attention was turned to the right nasal chamber.  Identical procedure was carried out on this side, compared to left side with identical results.  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  There was minimal active drainage at the end of the procedure.  Kennedy packs soaked in a Bactroban ointment solution were placed in both middle meatus. Throat pack was removed.  Nasogastric tube was placed; this was used to decompress the stomach contents (they were removed without incident).  FLUIDS GIVEN DURING PROCEDURE:  Approximately 1200 cc crystalloid.  ESTIMATED BLOOD LOSS:  Less than 30 cc.  URINE OUTPUT:  Not measured.  DRAINS:  None.  FINAL CLOSURE:  The above-noted Kennedy packs were placed.  Specimen was sent from sinus contents for a culture and sensitivity and pathology.  The patient tolerated the procedure well without complications.   The patient was extubated  in the operating room and transferred to recovery room in stable condition. Sponge, needle and instrument counts were correct at end of the procedure.  TOTAL DURATION:  Approximately 2 hours.  DISPOSITION:  The patient will be admitted and noted for overnight recovery. Once she has recovered well she will be sent home [phone]).  She will sent home on Keflex 500 mg p.o. t.i.d. for two weeks; and Vicodin #30 with two refills, one to two tabs p.o. q.4h. p.r.n. pain.  She is to have light activity, no heavy lifting or nose blowing for two weeks following surgery. Both she and her family were given oral and written instructions.  They were instructed to call if any problems with bleeding, fever or vomiting, pain, reaction to medications or any other questions.  She will follow up in the office for pack removal and bilateral endoscopic debridement of sinus cavities on Thursday, Dec 12, 1999 at 3:50 p.m. DD:  12/05/99 TD:  12/09/99 Job: [id number]

## 2010-12-20 NOTE — Assessment & Plan Note (Signed)
[facility]                               PULMONARY OFFICE NOTE   NAME:[name], [name]                         MRN:          [id number]  DATE:06/17/2006                            DOB:          08/09/1930    HISTORY OF PRESENT ILLNESS:  The patient is a 77 year old white female  patient of Dr. Thurston Hole who has a history of mild COPD with an asthmatic  component who presents to the office for a 1-week followup.  Last visit, the  patient was having a COPD exacerbation and was treated with a 5-day course  of Levaquin and a predinsone taper.  She returns today reporting that she is  significantly improved with decreased cough, congestion and wheezing.  The  patient has brought all of her medications in today with her for review and  are correct with our patient medication list.  The patient denies any  hemoptysis, chest pain, orthopnea, PND or leg swelling.   PAST MEDICAL HISTORY:  Reviewed.   CURRENT MEDICATIONS:  Reviewed.   PHYSICAL EXAMINATION:  GENERAL:  The patient is a pleasant female in no  acute distress.  VITAL SIGNS:  She is afebrile with stable vital signs. O2 saturation is 96%  on room air.  HEENT:  Unremarkable.  NECK:  Supple without adenopathy.  LUNGS:  Lung sounds are clear bilaterally.  CARDIAC:  Regular rate and rhythm.  ABDOMEN:  Soft and benign.  EXTREMITIES:  Warm without edema.   IMPRESSION/PLAN:  1. Recent exacerbation of chronic obstructive pulmonary disease, now      resolved with antibiotics and steroids. The patient will continue on      current regimen. Follow back up with Dr. Sherene [name] as scheduled or sooner if      needed.  2. Complex medication regimen. The patient's medications reviewed. Patient      education is provided. A computerized medication calendar was completed      for this patient and reviewed in detail. The patient is aware to bring      this back to each and every visit.      Rubye Oaks, NP  Electronically Signed      [name] [name]. [name] Sires, MD, [facility]  Electronically Signed   TP/MedQ  DD: 06/18/2006  DT: 06/18/2006  Job #: [id number]

## 2010-12-20 NOTE — Op Note (Signed)
NAME:  [name] [name], [name] [name]                  ACCOUNT NO.:  [id number]   MEDICAL RECORD NO.:  [id number]          PATIENT TYPE:  AMB   LOCATION:  CARD                         FACILITY:  [facility]   PHYSICIAN:  Casimiro Needle B. [name], M.D. [facility] OF BIRTH:  1931-02-18   DATE OF PROCEDURE:  08/13/2005  DATE OF DISCHARGE:                                 OPERATIVE REPORT   REFERRING PHYSICIAN:  This patient is self-referred.   HISTORY/INDICATIONS:  Please see attached pulmonary office notes.  This is a  77 year old white female with active smoking, chronic cough, and suggestion  of right middle lobe atelectatic changes on chest x-ray.  She agreed to the  procedure after a full discussion of risks, benefits and alternatives.   The procedure was performed in the bronchoscopy suite with continuous  monitoring by surface ECG and oximetry.  She received a total of 5 mg of IV  Versed with adequate sedation and cough suppression and was treated also  with a 1% lidocaine by updraft nebulizer before the procedure.   The right naris was additionally prepared with 2% lidocaine.  __________.  Using a standard video bronchoscope, the right naris was easily cannulated  with good visualization to the oropharynx and larynx.  The cords moved  normally, and there were no apparent upper airway lesions.  By infusing an  additional 1% lidocaine as needed, the entire tracheobronchial tree was  explored bilaterally with the following findings:  1)  Trachea, carina, and  all of the major airways opened widely to the subsegmental level.  2)  There  was mild excessive expiratory collapse of the airways, but no focal  endobronchial lesions were seen on inspiration.   PROCEDURE:  Using a wedge position in the right middle lobe, I did a  standard lavage with moderate return of slightly cloudy lavage fluid with no  obvious purulence.   IMPRESSION:  Atelectasis to the right middle lobe, consistent with a right  middle lobe syndrome,  rule out atypical Mycobacterial infection.   RECOMMENDATIONS:  Follow up in the office in two weeks.  Continue to work  hard on stopping smoking to improve mucociliary function.   Patient tolerated the procedure well and will be observed until she is alert  and saturations are adequate on room air.           ______________________________  Charlaine Dalton. [name], M.D. [facility]     MBW/MEDQ  D:  08/13/2005  T:  08/13/2005  Job:  [id number]

## 2010-12-20 NOTE — Assessment & Plan Note (Signed)
[facility]                               PULMONARY OFFICE NOTE   NAME:[name] [name], [name] [name]                         MRN:          [id number]  DATE:02/20/2006                            DOB:          22-Sep-1930    HISTORY:  A 77 year old white female still actively smoking with minimum  COPD with an asthmatic component documented on previous PFT's on September 24, 2005, who on her own found by trial and error that she did much worse  off of Advair than on it.  She has minimum dyspnea when she goes up flights  of steps, but no significant cough, chest pain, fevers, chills, sweats,  orthopnea or leg swelling.   MEDICATIONS:  Her medications were reviewed with her in detail today and  listed on the face sheet on February 20, 2006.  Corrected the list.   PHYSICAL EXAMINATION:  GENERAL:  She is a pleasant elderly white female in  no acute distress.  VITAL SIGNS:  Normal.  HEENT:  Unremarkable.  Oropharynx is clear.  LUNGS:  Lung fields revealed diminished breath sounds with no wheezing,  generalized, localized or otherwise.  HEART:  Regular rate and rhythm without murmur, gallop, or rub.  ABDOMEN:  Soft, benign.  EXTREMITIES:  Without without calf tenderness, cyanosis, clubbing or edema.   M.D.I. technique was reviewed.  Previously, where she was 50% baseline, she  is now 75% at baseline (she needs to remember to exhale all the way to  residual volume before she breathes in.  However, overall, her technique is  much improved.   IMPRESSION:  1.  Mild chronic obstructive pulmonary disease with a symptomatic asthmatic      component to it.  This problem may resolve with complete smoking      abstinence, but for now she appears to be Advair dependent, and I have      encouraged her to continue to use the lowest dose, Advair 45/21 two      puffs b.i.d. and work hard on smoking cessation.  2.  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  Unless there is definite worsening of infiltrates, followup      can be yearly hereafter.                                   Charlaine Dalton. [name], MD, [facility]   MBW/MedQ  DD:  02/20/2006  DT:  02/20/2006  Job #:  [id number]

## 2010-12-20 NOTE — H&P (Signed)
NAME:  [name] [name], [name] [name]                  ACCOUNT NO.:  [id number]   MEDICAL RECORD NO.:  [id number]          PATIENT TYPE:  INP   LOCATION:  5038                         FACILITY:  [facility]   PHYSICIAN:  John L. Rendall, M.D.  DATE OF BIRTH:  Nov 22, 1930   DATE OF ADMISSION:  01/29/2007  DATE OF DISCHARGE:  02/02/2007                              HISTORY & PHYSICAL   CHIEF COMPLAINT:  Right hip pain for the last 3 months.   HISTORY OF PRESENT ILLNESS:  This 77 year old, white female patient  presented to Dr. [name] with a history of bilateral total knees, one in  1996 and one in 1998 by Dr. Dr. [name] and now a 49-month history of  sudden onset progressive right hip pain.  She has had no known injury or  prior surgery to her hip.  The pain at this point is an almost constant  ache to stabbing sensation in the right buttock, trochanter and groin  region with radiation down into the knee.  The pain increases with  turning her leg or getting up and down and then decreases with Vicodin.  She cannot sleep on the right side and she has difficulty putting on her  socks and shoes.  She has received a cortisone shot in the past without  relief and she does ambulate with a cane p.r.n. Due to failure of  conservative treatment, she is presenting for a right hip replacement.   ALLERGIES:  MORPHINE causes hives and nausea.   CURRENT MEDICATIONS:  1. Benicar/HCT 40/12.5 mg 1 tablet p.o. q.a.m.  2. Flonase one squirt to nares q.a.m.  3. Celebrex 200 mg 1 tablet p.o. q.a.m.  4. Spectravite SR 1 tablet p.o. q.a.m.  5. Calcium, magnesium and zinc combo 1 tablet p.o. q.a.m.  6. Vicodin 1-2 p.o. b.i.d. p.r.n. pain.  7. Extended release Tylenol 650 mg 1 tablet p.o. q.a.m.  8. Super B complex 1 tablet p.o. q.a.m.  9. Cosamin DS 1 tablet p.o. b.i.d.   PAST MEDICAL HISTORY:  1. Hypertension.  2. Coronary artery disease with history of cardiac cath.  3. Asthmatic bronchitis.   PAST SURGICAL HISTORY:  1.  Cataract right eye November 2007.  2. Sinus surgery in 2001.  3. Right total knee arthroplasty by Dr. Richardson Landry February 1998.  4. Left total knee arthroplasty by Dr. Richardson Landry September 1996.   She denies any complications from the above-mentioned procedures.   SOCIAL HISTORY:  She has a 50 pack-year history of cigarette smoking  which she quit in 2007.  She drinks alcohol about two drinks a day about  5 days a week.  She does not use any drugs.  She lives by herself in a  second floor apartment and she has to climb 16 steps to get up to the  apartment.  She has two children. She is a widow.  Her medical doctor is  Dr. [name] with [name] and her cardiologist is Dr. [name] with  [facility]. Her pulmonologist is Dr. [name].   FAMILY HISTORY:  Mother died at the age  of 61 with kidney disease.  Father died at the age of 66 with heart disease, heart attack and high  blood pressure. Brother is alive at age 63. Her grandparents had a  history of diabetes and prostate cancer. She does have a daughter with  rheumatoid arthritis. She is 50 years old and a son who is 51 years old  and alive and well.   REVIEW OF SYSTEMS:  She does have a cataract still in her left eye and  does wear glasses.  She does have shortness of breath with stairs.  She  has a problem with pneumonia and bronchitis in the past, the last in  2006.  She has not had bronchitis in about 15 years, same thing with  asthma.  She did have more asthmatic bronchitis for the last 15 years  but that has been fairly well-controlled.  She has had chest pain about  6 times a year and that has had a negative workup.  She does have high  blood pressure.  She has a history of a duodenal ulcer and has had  problems with colitis the last 6-7 years.  She has a history of whooping  cough.  She does get skin rashes on occasion but nothing recently.  All  other systems are negative and noncontributory.    PHYSICAL EXAM:  GENERAL:  Well-developed, well-nourished, white female  in no acute distress.  Talks easily with examiner.  Mood and affect are  appropriate.  Height 5 feet 7-1/2 inches, weight 185 pounds, BMI is  27.5.  VITAL SIGNS:  Temperature is 98.5 degrees Fahrenheit, pulse 84,  respirations 12, BP 134/82.  HEENT:  Normocephalic, atraumatic without frontal or maxillary sinus  tenderness to palpation.  Conjunctiva pink.  Sclerae anicteric.  PERRLA.  EOMs intact.  No visible external ear deformities.  Hearing grossly  intact.  Tympanic membranes pearly gray bilaterally with good light  reflex with the exception of a large amount of cerumen in the right ear  canal.  Hearing grossly intact.  Nose and nasal septum midline.  Nasal  mucosa pink and moist without exudates or polyps noted.  Buccal mucosa  pink and moist.  Dentition in good repair.  Pharynx without erythema or  exudates.  Tongue and uvula midline.  Tongue without fasciculations and  uvula rises equally with phonation.  NECK:  No visible masses or lesions noted.  Trachea midline.  No  palpable lymphadenopathy nor thyromegaly.  Carotids +2 bilaterally  without bruits.  Full range of motion, nontender to palpation along the  cervical spine.  CARDIOVASCULAR:  Heart rate and rhythm regular.  S1, S2 present without  rubs, clicks or murmurs noted.  RESPIRATORY:  Respirations even and unlabored.  Breath sounds clear to  auscultation bilaterally without rales or wheezes noted.  ABDOMEN:  Rounded abdominal contour.  Bowel sounds present x4 quadrants.  Soft, nontender to palpation without hepatosplenomegaly nor CVA  tenderness.  BREASTS/GU/RECTAL/PELVIC:  These exams deferred at this time.  MUSCULOSKELETAL:  No obvious deformities bilateral upper extremities  with full range of motion of these extremities without pain.  Radial  pulses +2 bilaterally.  Full range of motion of her knees, ankles and  toes bilaterally.  DP and PT pulses  +2.  The left hip has 0 to 120  degrees of range of motion with full internal-external rotation without  pain.  She has a well-healed midline knee incision with 0 to 110 degrees  range of motion but mild pain with  palpation but she is stable to varus  and valgus stress.  Right hip has full extension, flexion to 100 degrees  with no internal rotation and only about 20 degrees of external  rotation.  She also has a well-healed midline knee incision, 0 to 100  degrees range of motion with mild pain along the lateral joint line.  No  calf pain with palpation.  Negative Homans' sign bilaterally.  NEUROLOGIC:  Alert and oriented x3.  Cranial nerves II-XII are grossly  intact.  Strength 5/5 bilateral upper and lower extremities.  Rapid  alternating movements intact.  Deep tendon reflexes 2+ bilateral upper  and lower extremities.  Sensation intact to light touch.   RADIOLOGIC FINDINGS:  X-rays taken of the right hip in May 2008 show a 2-  mm joint space on the right hip when comparison on the left there is  about 3 to 3-1/2 mm of joint space.   IMPRESSION:  1. Osteoarthritis right hip.  2. Hypertension.  3. Coronary artery disease.  4. Asthmatic bronchitis.   PLAN:  Ms. Olveda will be admitted to [facility] on January 29, 2007 where she will undergo a right total hip arthroplasty by Dr. Jonny Ruiz  L.  Rendall.  She will undergo all the routine preoperative laboratory  tests and studies prior to this procedure.      Legrand Pitts Duffy, P.A.      John L. Rendall, M.D.  Electronically Signed    KED/MEDQ  D:  02/03/2007  T:  02/03/2007  Job:  [id number]

## 2010-12-20 NOTE — Assessment & Plan Note (Signed)
[name] [name]                             PULMONARY OFFICE NOTE   NAME:[name] [name]                         MRN:          [id number]  DATE:09/30/2006                            DOB:          1930-08-08    HISTORY OF PRESENT ILLNESS:  The patient is a 77 year old white female  patient of Dr. Thurston Hole with a known history of mild COPD with an  asthmatic component who presents for an acute office visit.  The patient  complains of a 1-week history of nasal congestion, productive cough with  thick yellow sputum, sore throat and shortness of breath.  The patient  reports that she has taken a 7-day course of Avelox, which she finished  the last dose yesterday, and a few days of prednisone.  The patient had  this prescription left over from her December visit in which she was  given antibiotics in case her symptoms worsen.  Her symptoms improved  and did not require antibiotics at that time.  The patient denies any  hemoptysis, orthopnea, PND or leg swelling.   PAST MEDICAL HISTORY:  Reviewed.   CURRENT MEDICATIONS:  Reviewed.   PHYSICAL EXAMINATION:  GENERAL:  The patient is a pleasant female in no  acute distress.  VITAL SIGNS:  She is afebrile with stable vital signs.  O2 saturation is  94% on room air.  HEENT:  Nasal mucosa with some mild erythema.  Nontender sinuses.  Posterior pharynx is clear.  NECK:  Supple without adenopathy.  No JVD.  LUNGS:  Lung sounds revealed course breath sounds bilaterally without  any wheezing.  CARDIAC:  Regular rate and rhythm.  ABDOMEN:  Soft and nontender.  EXTREMITIES:  Warm without any edema.   IMPRESSION AND PLAN:  Slow to resolve asthmatic bronchitic exacerbation.  Chest x-ray is pending at the time of dictation.  We will extend Avelox  out for an additional 3 days.  Add in Mucinex DM twice daily and  Tussionex as needed for cough.  The patient is to return back with Dr.  [name] in 2 weeks, or sooner if  needed.      Rubye Oaks, NP  Electronically Signed      Charlaine Dalton. [name], MD, [facility]  Electronically Signed   TP/MedQ  DD: 09/30/2006  DT: 09/30/2006  Job #: [id number]

## 2010-12-20 NOTE — Assessment & Plan Note (Signed)
[facility]                               PULMONARY OFFICE NOTE   NAME:[name] [name], [name] [name]                         MRN:          [id number]  DATE:06/10/2006                            DOB:          06-07-1931    HISTORY:  This is a very nice 77 year old white female who is still trying  to quit smoking with acute cough and congestion for the last week with  yellow sputum and watery rhinitis.  No sore throat or definite fever or  chest pain or dyspnea.   For full list of medication, please see face sheet dated June 10, 2006.  Note that the patient is maintained on Advair 45/21 two puffs b.i.d. which  she says has been helping her baseline dyspnea and cough.   On physical examination, she is a slightly anxious but pleasant white female  in no acute distress.  She had stable vital signs except for a blood  pressure of 140/98, pulse rate is only 68.  HEENT is unremarkable.  Pharynx  is clear.  There is no evidence of excessive post nasal drainage or  cobblestoning.  Neck is supple without cervical adenopathy or tenderness.  Trachea is midline.  Lung fields reveal a few rhonchi bilaterally however  air movement is adequate with no localized or generalized wheezing.  There  is regular rate and rhythm without murmur, rub, or gallop present.  Abdomen  soft and benign.  Extremities are warm without calf tenderness, cyanosis,  clubbing, edema.   IMPRESSION:  1. Acute tracheobronchitis with no evidence of pneumonia.  I recommend      Levaquin 750 for five days since she does have a history of      bronchiectasis and Mucinex two b.i.d. (this is already prescribed but      the patient did realize she could take it).  I was concerned about the      patient's inability to process complicated instructions and distinguish      between maintenance and p.r.n. medicines.  After a medication review      today, I am going to recommend that she recheck with [place] in a week  for      her blood pressure and also for full medication reconciliation and      generalization of medication calendar.  2. Hypertension:  I have advised her regarding salt restriction and we      will recheck her blood pressure in a week when she returns for      medication reconciliation.    ______________________________  Charlaine Dalton [name], MD, [facility]    MBW/MedQ  DD: 06/10/2006  DT: 06/11/2006  Job #: [id number]   cc:   Gretta Arab. Valentina Lucks, M.D.

## 2010-12-20 NOTE — Assessment & Plan Note (Signed)
[facility]                             PULMONARY OFFICE NOTE   NAME:[name] [name], [name] [name]                         MRN:          [id number]  DATE:07/22/2006                            DOB:          09-Jul-1931    HISTORY OF PRESENT ILLNESS:  Patient is a 77 year old white female  patient of Dr. Thurston Hole who has a known history of mild COPD with an  asthmatic component, presents for an acute office visit.  Patient  complained of a 1 week history of nasal congestion, sore throat, and  mild cough.  Patient denies any hemoptysis, orthopnea, PND, or leg  swelling.   PAST MEDICAL HISTORY:  Reviewed.   CURRENT MEDICATIONS:  Review physical exam.   Patient is a pleasant, elderly female in no acute distress.  She is  afebrile today.  VITAL SIGNS:  HEENT:  Reveals nasal mucosa slightly pale with some mild turbinate  edema.  Nontender sinus.  Posterior pharynx is clear.  NECK:  Supple without adenopathy or bruit.  LUNGS:  Clear without any wheezing or crackles.  CARDIAC:  Regular rate and rhythm.  ABDOMEN:  Soft and nontender.  EXTREMITIES:  Warm without any edema.   PLAN:  Acute upper respiratory infection with mild rhinitis flare.  Suspect that this is viral in nature.  Patient is advised to increase  Flonase up to 2 tablets twice daily.  Continue on Mucinex twice daily.  May add saline nasal spray as needed.  Patient is leaving to go out of  town for the holidays.  Have given her a prescription for an antibiotic  with Avelox x7 days, and a prednisone taper in case symptoms worsen, she  is prone to chronic obstructive pulmonary disease flares in the past.  I  have advised her to please hold on to these prescriptions and not fill  unless symptoms worsen with purulent sputum.  Patient is also  recommended to follow back up in the office as scheduled or sooner if  needed.      Rubye Oaks, NP  Electronically Signed      Charlaine Dalton. [name], MD, [facility]  Electronically Signed   TP/MedQ  DD: 07/24/2006  DT: 07/25/2006  Job #: [id number]

## 2010-12-20 NOTE — Assessment & Plan Note (Signed)
[facility]                             PULMONARY OFFICE NOTE   NAME:[name], [name]                         MRN:          [id number]  DATE:10/22/2006                            DOB:          Dec 13, 1930    HISTORY:  A 77 year old white female with history of smoking with  asthmatic bronchitis that has been well controlled on a combination of  Advair 45/21 two puffs b.i.d. chronically, but acutely deteriorated in  the setting of rhinitis with chills and lots of chest congestion,  consisting of thick, yellow mucous.  This all happened starting in  March, and she has now been treated with a course of Avelox, Mucinex,  p.r.n. tramadol (which she says she did not need), and a short course of  prednisone, comes back today all smiles having finished the prednisone  four days ago, and having no decline in her functional status or  recurrence of any of her symptoms since that time.  Specifically, she  denies any nocturnal wheeze, orthopnea, PND, significant, ongoing cough,  fevers, chills, sweats, leg swelling, or chest pain.   For full info on medications, please see patient sheet column dated  October 22, 2006.   PHYSICAL EXAMINATION:  She is a pleasant, relatively thin, ambulatory  white female in no acute distress.  Afebrile with normal vital signs.  HEENT:  Remarkable for minimum turbinate edema, oropharynx clear.  NECK:  Supple without cervical adenopathy or tenderness.  Trachea is  midline, no thyromegaly.  Lung fields reveal clear breath sounds bilaterally.  HEART:  Regular rhythm without murmurs, gallops, or rubs.  ABDOMEN:  Soft, benign.  EXTREMITIES: Warm without calf tenderness, cyanosis, clubbing, or edema.   Hemo saturation 94% on room air.   PFTs were reviewed from September 15, 2005, and show minimal airflow  obstruction after bronchodilators with normal diffusion capacity.  Chest  x-ray from September 29, 2006 is normal with a minimum  increase in right  middle lobe density that is less prominent than in previous studies.   IMPRESSION:  This patient predominantly has asthmatic bronchitis related  to smoking, in fact, may be able to taper off of Advair and stop it if  she maintains off cigarettes for 6 months.  I have applauded her ability  to stop smoking, but warned her that many patients resume smoking once  they feel better, which is the setting she now finds herself in.   I did review with her each and every one of her maintenance versus  p.r.n. medicines.  I switched Mucinex to Mucinex DM 1 every 6 hours  p.r.n., and suggested that when she has increased nasal symptoms she  should take a second dose of fluticasone at bedtime.  Otherwise, she can  maintain on 1 dose daily in the morning.   Pulmonary followup can be p.r.n.     Charlaine Dalton. [name], MD, [facility]  Electronically Signed    MBW/MedQ  DD: 10/22/2006  DT: 10/22/2006  Job #: [id number]   cc:   Gretta Arab. Valentina Lucks, M.D.

## 2010-12-20 NOTE — Assessment & Plan Note (Signed)
[facility]                             PULMONARY OFFICE NOTE   NAME:[name] [name], [name] [name]                         MRN:          [id number]  DATE:10/15/2006                            DOB:          21-Jan-1931    HISTORY OF PRESENT ILLNESS:  Patient is a 77 year old white female  patient of Dr. Thurston Hole who has a known history of mild COPD with and  asthmatic component that returns today related to persistent cough.  Patient was seen in the office 2 weeks ago for a persistent cough and  congestion. She had been treated with Avalox. Antibiotics were extended  for a total of 10 day course. Patient reports her symptoms did improve,  however, her cough never totally resolved. She returns today complaining  that her cough is worse over the last 2 days and she continues to have  significant coughing paroxysms. Patient denies any hemoptysis,  orthopnea, PND, or leg swelling. A chest x-ray on September 29, 2006 was  essentially unremarkable.   PAST MEDICAL HISTORY:  Reviewed.   CURRENT MEDICATIONS:  Reviewed.   PHYSICAL EXAMINATION:  Patient is a pleasant elderly female in no acute  distress. She is afebrile, stable vital signs. O2 saturation is 97% on  room air.  HEENT: Unremarkable.  NECK: Supple without cervical adenopathy. No JVD.  LUNG SOUNDS: Reveal coarse breath sounds with a few scattered rhonchi.  CARDIAC: Regular rate.  ABDOMEN: Soft and nontender.  EXTREMITIES: Warm without any edema.   IMPRESSION AND PLAN:  Slow to resolve asthmatic bronchitic exacerbation.  Patient is to continue on Mucinex twice daily. Add in Delsym 2 teaspoons  twice daily and for breakthrough coughing may use tramadol 50 mg every 4  hours. Patient will also complete a prednisone taper over the next week.  And follow back up with Dr. [name] in 1 week or sooner if needed. Patient  is advised on cough suppression with non mint lozenges as well.      Rubye Oaks, NP  Electronically Signed      Charlaine Dalton. [name], MD, [facility]  Electronically Signed   TP/MedQ  DD: 10/15/2006  DT: 10/16/2006  Job #: [id number]

## 2011-02-26 ENCOUNTER — Ambulatory Visit (INDEPENDENT_AMBULATORY_CARE_PROVIDER_SITE_OTHER): Payer: Medicare Other | Admitting: Adult Health

## 2011-02-26 ENCOUNTER — Encounter: Payer: Self-pay | Admitting: Adult Health

## 2011-02-26 VITALS — BP 100/80 | HR 86 | Temp 98.0°F | Ht 67.5 in | Wt 188.8 lb

## 2011-02-26 DIAGNOSIS — J449 Chronic obstructive pulmonary disease, unspecified: Secondary | ICD-10-CM

## 2011-02-26 MED ORDER — CEFDINIR 300 MG PO CAPS: 300.0000 mg | ORAL_CAPSULE | Freq: Two times a day (BID) | ORAL | Status: AC

## 2011-02-26 NOTE — Patient Instructions (Signed)
Omnicef 300mg Twice daily  For 7 days .  Mucinex DM Twice daily  As needed  Cough/congestion  Fluids and rest  Please contact office for sooner follow up if symptoms do not improve or worsen or seek emergency care   

## 2011-02-26 NOTE — Assessment & Plan Note (Signed)
Exacerbation  Congratulated on smoking cesstation  xopenex neb in office   Plan:  Omnicef 300mg  Twice daily  For 7 days  Mucinex DM Twice daily  As needed  Cough/congestion Fluids and rest  Please contact office for sooner follow up if symptoms do not improve or worsen or seek emergency care

## 2011-02-26 NOTE — Progress Notes (Signed)
Subjective:    Patient ID: [name] [name], female    DOB: 11-09-1930, 77 y.o.   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 minimal am cough, worse in winter, mucoid, better since quit smoking.   11/11/2010 ov cc no change doe,  Still smoking no noct or early am exac or sign cough/ excess mucus.   02/26/2011 Acute OV Pt complains of c/o productive cough with green mucus x 1 week. No otc used.  Cough is throughout the day.  Some wheezing.   Remains on Symbicort 2 puffs Twice daily  .  No smoking for 2 months.  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                  Review of Systems Constitutional:   No  weight loss, night sweats,  Fevers, chills, fatigue, or  lassitude.  HEENT:   No headaches,  Difficulty swallowing,  Tooth/dental problems, or  Sore throat,                No sneezing, itching, ear ache, nasal congestion, post nasal drip,   CV:  No chest pain,  Orthopnea, PND, swelling in lower extremities, anasarca, dizziness, palpitations, syncope.   GI  No heartburn, indigestion,  abdominal pain, nausea, vomiting, diarrhea, change in bowel habits, loss of appetite, bloody stools.   Resp: No shortness of breath with exertion or at rest.  No excess mucus, no productive cough,  No non-productive cough,  No coughing up of blood.  No change in color of mucus.  No wheezing.  No chest wall deformity  Skin: no rash or lesions.  GU: no dysuria, change in color of urine, no urgency or frequency.  No flank pain, no hematuria   MS:  No joint pain or swelling.  No decreased range of motion.  No back pain.  Psych:  No change in mood or affect. No depression or anxiety.  No memory loss.          Objective:   Physical Exam       wt 188 September 26, 2010  > 185 11/11/2010 >188 02/26/2011  thin pleasant amb wf nad  HEENT mild turbinate edema. Oropharynx no thrush or excess pnd or cobblestoning. No JVD or cervical adenopathy. Mild accessory muscle hypertrophy. Trachea midline, nl thryroid. Chest : coarse BS w/ no wheezing  Regular rate and rhythm without murmur gallop or rub  or increase P2 or edema. Abd: no hsm, nl excursion. Ext warm without cyanosis or clubbing.    Assessment & Plan:  cxr reviewed from 10/25/10 and copd only

## 2011-03-27 ENCOUNTER — Other Ambulatory Visit: Payer: Self-pay | Admitting: Family Medicine

## 2011-03-27 ENCOUNTER — Ambulatory Visit
Admission: RE | Admit: 2011-03-27 | Discharge: 2011-03-27 | Disposition: A | Discharge status: Home / Self Care | Payer: Medicare Other | Source: Ambulatory Visit | Attending: Family Medicine | Admitting: Family Medicine

## 2011-03-27 DIAGNOSIS — R51 Headache: Secondary | ICD-10-CM

## 2011-05-20 LAB — CBC
HCT: 30.8 — ABNORMAL LOW
Platelets: 306
RDW: 14.3 — ABNORMAL HIGH

## 2011-05-21 LAB — CBC
HCT: 24.8 — ABNORMAL LOW
HCT: 29.2 — ABNORMAL LOW
HCT: 38
MCV: 96.4
MCV: 96.7
Platelets: 225
Platelets: 231
Platelets: 238
Platelets: 318
RDW: 12.6
RDW: 12.8
RDW: 13.2
WBC: 6.1
WBC: 6.3

## 2011-05-21 LAB — BASIC METABOLIC PANEL
BUN: 10
BUN: 15
BUN: 5 — ABNORMAL LOW
CO2: 26
Calcium: 9.7
Chloride: 96
Creatinine, Ser: 0.49
Creatinine, Ser: 0.6
GFR calc non Af Amer: >60
GFR calc non Af Amer: >60
Glucose, Bld: 101 — ABNORMAL HIGH
Glucose, Bld: 112 — ABNORMAL HIGH
Glucose, Bld: 126 — ABNORMAL HIGH
Potassium: 5.4 — ABNORMAL HIGH

## 2011-05-21 LAB — URINALYSIS, ROUTINE W REFLEX MICROSCOPIC
Bilirubin Urine: NEGATIVE
Hgb urine dipstick: NEGATIVE
Specific Gravity, Urine: 1.012 (ref 1.005–1.035)
Urobilinogen, UA: 0.2
pH: 7

## 2011-05-21 LAB — DIFFERENTIAL
Basophils Absolute: 0
Eosinophils Relative: 5
Lymphocytes Relative: 32
Neutro Abs: 4

## 2011-05-21 LAB — URINE MICROSCOPIC-ADD ON

## 2011-05-21 LAB — CROSSMATCH: ABO/RH(D): B NEG

## 2011-05-21 LAB — URINE CULTURE

## 2011-05-21 LAB — PROTIME-INR: Prothrombin Time: 13

## 2011-05-21 LAB — HEMOGLOBIN AND HEMATOCRIT, BLOOD: HCT: 25.7 — ABNORMAL LOW

## 2011-09-02 ENCOUNTER — Other Ambulatory Visit: Payer: Self-pay | Admitting: Family Medicine

## 2011-09-02 ENCOUNTER — Ambulatory Visit
Admission: RE | Admit: 2011-09-02 | Discharge: 2011-09-02 | Disposition: A | Discharge status: Home / Self Care | Payer: Medicare Other | Source: Ambulatory Visit | Attending: Family Medicine | Admitting: Family Medicine

## 2011-09-02 DIAGNOSIS — T1490XA Injury, unspecified, initial encounter: Secondary | ICD-10-CM

## 2011-11-04 ENCOUNTER — Other Ambulatory Visit: Payer: Self-pay | Admitting: Family Medicine

## 2011-11-04 DIAGNOSIS — Z1231 Encounter for screening mammogram for malignant neoplasm of breast: Secondary | ICD-10-CM

## 2011-12-15 ENCOUNTER — Ambulatory Visit
Admission: RE | Admit: 2011-12-15 | Discharge: 2011-12-15 | Disposition: A | Discharge status: Home / Self Care | Payer: Medicare Other | Source: Ambulatory Visit | Attending: Family Medicine | Admitting: Family Medicine

## 2011-12-15 DIAGNOSIS — Z1231 Encounter for screening mammogram for malignant neoplasm of breast: Secondary | ICD-10-CM

## 2012-06-25 ENCOUNTER — Encounter: Payer: Self-pay | Admitting: Internal Medicine

## 2012-06-25 ENCOUNTER — Ambulatory Visit (INDEPENDENT_AMBULATORY_CARE_PROVIDER_SITE_OTHER)
Admission: RE | Admit: 2012-06-25 | Discharge: 2012-06-25 | Disposition: A | Discharge status: Home / Self Care | Payer: Medicare Other | Source: Ambulatory Visit | Attending: Internal Medicine | Admitting: Internal Medicine

## 2012-06-25 ENCOUNTER — Ambulatory Visit (INDEPENDENT_AMBULATORY_CARE_PROVIDER_SITE_OTHER): Payer: Medicare Other | Admitting: Internal Medicine

## 2012-06-25 VITALS — BP 114/68 | HR 85 | Temp 98.5°F | Ht 67.0 in | Wt 189.0 lb

## 2012-06-25 DIAGNOSIS — J4489 Other specified chronic obstructive pulmonary disease: Secondary | ICD-10-CM

## 2012-06-25 DIAGNOSIS — J449 Chronic obstructive pulmonary disease, unspecified: Secondary | ICD-10-CM

## 2012-06-25 MED ORDER — ACLIDINIUM BROMIDE 400 MCG/ACT IN AEPB: 1.0000 | INHALATION_SPRAY | Freq: Two times a day (BID) | RESPIRATORY_TRACT | Status: DC

## 2012-06-25 NOTE — Progress Notes (Signed)
  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  No obvious daytime variabilty or assoc chronic cough or cp or chest tightness, subjective wheeze overt sinus or hb symptoms. No unusual exp hx or h/o childhood pna/ asthma or premature birth to his knowledge.   Sleeping ok without nocturnal  or early am exacerbation  of respiratory  c/o's or need for noct saba. Also denies any obvious fluctuation of symptoms with weather or environmental changes or other aggravating or alleviating factors except as outlined above   ROS  The following are not active complaints unless bolded sore throat, dysphagia, dental problems, itching, sneezing,  nasal congestion or excess/ purulent secretions, ear ache,   fever, chills, sweats, unintended wt loss, pleuritic or exertional cp, hemoptysis,  orthopnea pnd or leg swelling, presyncope, palpitations, heartburn, abdominal pain, anorexia, nausea, vomiting, diarrhea  or change in bowel or urinary habits, change in stools or urine, dysuria,hematuria,  rash, arthralgias, visual complaints, headache, numbness weakness or ataxia or problems with walking or coordination,  change in mood/affect or memory.          Past Medical History:  COPD (ICD-496)  - PFT's 09/15/2005 FEV1 1.69 (76%) with ratio 67 and better x 14% p B2, DLC 95%  - PFT's  11/11/2010  FEV1 1.32 (63%) with ratio 62 and no better p B2,  DLC0 66% - HFA 90% 11/11/2010  COUGH, CHRONIC (ICD-786.2)  HYPERTENSION (ICD-401.9)    Past Surgical History:  Post Sinus surgery by DrRedman in 2002  2 remote knee replacements  Morton Neuroma  repair on her left foot  Rt hip replaced 2009   Family History:  Heart dz- Father (passed with MI at age 50)  Prostate CA- PGF  PF brother former smoker   Social History:  Widowed  3 children  Works as a Architect  Current smoker- only smokes occ  ETOH- wine and gin daily         Objective:   Physical Exam     Wt 188 September 26, 2010  > 185 11/11/2010 >188 02/26/2011 > 06/25/2012  189 thin pleasant amb wf nad  HEENT mild turbinate edema. Oropharynx no thrush or excess pnd or cobblestoning. No JVD or cervical adenopathy. Mild accessory muscle hypertrophy. Trachea midline, nl thryroid. Chest : coarse BS w/ no wheezing  Regular rate and rhythm without murmur gallop or rub or increase P2 or edema. Abd: no hsm, nl excursion. Ext warm without cyanosis or clubbing.   CXR  06/25/2012 :  No acute disease.      Assessment & Plan:

## 2012-06-25 NOTE — Patient Instructions (Addendum)
tudorza is one puff twice daily  Only use your albuterol as a rescue medication to be used if you can't catch your breath by resting or doing a relaxed purse lip breathing pattern. The less you use it, the better it will work when you need it.   Please remember to go to the  x-ray department downstairs for your tests - we will call you with the results when they are available.     Please schedule a follow up visit in 2 months but call sooner if needed with pfts on return

## 2012-06-25 NOTE — Progress Notes (Signed)
Quick Note:  Spoke with pt and notified of results per Dr. Wert. Pt verbalized understanding and denied any questions.  ______ 

## 2012-06-25 NOTE — Assessment & Plan Note (Addendum)
-   PFT's 09/15/2005 FEV1 1.69 (76%) with ration67 and better x 14% p B2, DLC 95%  - PFT's 11/11/2010 FEV1 1.32 (63%) with ratio 62 and no better p B2, DLC0 66%  - HFA 100% 06/25/2012  - Add tudorza 06/25/2012   The proper method of use, as well as anticipated side effects, of a metered-dose inhaler are discussed and demonstrated to the patient. Improved effectiveness after extensive coaching during this visit to a level of approximately  100%  DDX of  difficult airways managment all start with A and  include Adherence, Ace Inhibitors, Acid Reflux, Active Sinus Disease, Alpha 1 Antitripsin deficiency, Anxiety masquerading as Airways dz,  ABPA,  allergy(esp in young), Aspiration (esp in elderly), Adverse effects of DPI,  Active smokers, plus two Bs  = Bronchiectasis and Beta blocker use..and one C= CHF  Adherence is always the initial "prime suspect" and is a multilayered concern that requires a "trust but verify" approach in every patient - starting with knowing how to use medications, especially inhalers, correctly, keeping up with refills and understanding the fundamental difference between maintenance and prns vs those medications only taken for a very short course and then stopped and not refilled.   ? Anxiety > dx of exclusion  F/u with pfts and walking sats

## 2012-08-25 ENCOUNTER — Encounter: Payer: Self-pay | Admitting: Internal Medicine

## 2012-08-25 ENCOUNTER — Ambulatory Visit (INDEPENDENT_AMBULATORY_CARE_PROVIDER_SITE_OTHER): Payer: Medicare Other | Admitting: Internal Medicine

## 2012-08-25 VITALS — BP 112/64 | HR 80 | Temp 97.5°F | Ht 67.0 in | Wt 184.0 lb

## 2012-08-25 DIAGNOSIS — J449 Chronic obstructive pulmonary disease, unspecified: Secondary | ICD-10-CM

## 2012-08-25 LAB — PULMONARY FUNCTION TEST

## 2012-08-25 NOTE — Progress Notes (Signed)
PFT done today. Katie Welchel,CMA  

## 2012-08-25 NOTE — Patient Instructions (Addendum)
Return in one year for follow up sooner if needed

## 2012-08-25 NOTE — Assessment & Plan Note (Signed)
-   PFT's 09/15/2005 FEV1 1.69 (76%) with ratio 67 and better x 14% p B2, DLC 95%  - PFT's 11/11/2010 FEV1 1.32 (63%) with ratio 62 and no better p B2, DLC0 66%  - PFT's 08/25/2012 FEV1 1.56 (76%) with ratio 66 and no beter p B2, DLCO 88% - HFA 100% 06/25/2012  - Add tudorza 06/25/2012 > no better   Adequate control on present rx, reviewed no need for tudorza probably because really not all that active.  As I explained to this patient in detail:  although there may be significant copd present, it does not appear to be limiting activity tolerance any more than a set of worn tires limits someone from driving a car  around a parking lot.  A new set of Michelins might look good but would have no perceived impact on the performance of the car and would not be worth the cost.  That is to say:   this pt is so sedentary I don't recommend aggressive pulmonary rx at this point unless limiting symptoms arise or acute exacerbations become as issue, neither of which are the case now.  I asked the patient to contact this office at any time in the future should either of these problems arise.

## 2012-08-25 NOTE — Progress Notes (Signed)
  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   rec tudorza is one puff twice daily Only use your albuterol as a rescue medication      08/25/2012 f/u ov/Wert cc no better on tudorza in terms of activity tol, rare need for saba daytime   No obvious daytime variabilty or assoc chronic cough or cp or chest tightness, subjective wheeze overt sinus or hb symptoms. No unusual exp hx or h/o childhood pna/ asthma or premature birth to her  knowledge.   Sleeping ok without nocturnal  or early am exacerbation  of respiratory  c/o's or need for noct saba. Also denies any obvious fluctuation of symptoms with weather or environmental changes or other aggravating or alleviating factors except as outlined above   ROS  The following are not active complaints unless bolded sore throat, dysphagia, dental problems, itching, sneezing,  nasal congestion or excess/ purulent secretions, ear ache,   fever, chills, sweats, unintended wt loss, pleuritic or exertional cp, hemoptysis,  orthopnea pnd or leg swelling, presyncope, palpitations, heartburn, abdominal pain, anorexia, nausea, vomiting, diarrhea  or change in bowel or urinary habits, change in stools or urine, dysuria,hematuria,  rash, arthralgias, visual complaints, headache, numbness weakness or ataxia or problems with walking or coordination,  change in mood/affect or memory.          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 Oropharynx no thrush or excess pnd or cobblestoning. No JVD or cervical adenopathy. Mild accessory muscle hypertrophy. Trachea midline, nl thryroid. Chest : coarse BS w/ no wheezing  Regular rate and rhythm without murmur gallop or rub or increase P2 or edema. Abd: no hsm, nl excursion. Ext warm without cyanosis or clubbing.   CXR  06/25/2012 :  No acute disease.      Assessment & Plan:

## 2012-11-12 ENCOUNTER — Other Ambulatory Visit: Payer: Self-pay

## 2012-11-12 DIAGNOSIS — Z1231 Encounter for screening mammogram for malignant neoplasm of breast: Secondary | ICD-10-CM

## 2012-12-15 ENCOUNTER — Ambulatory Visit
Admission: RE | Admit: 2012-12-15 | Discharge: 2012-12-15 | Disposition: A | Discharge status: Home / Self Care | Payer: Medicare Other | Source: Ambulatory Visit

## 2012-12-15 DIAGNOSIS — Z1231 Encounter for screening mammogram for malignant neoplasm of breast: Secondary | ICD-10-CM

## 2013-05-24 ENCOUNTER — Ambulatory Visit
Admission: RE | Admit: 2013-05-24 | Discharge: 2013-05-24 | Disposition: A | Discharge status: Home / Self Care | Payer: Medicare Other | Source: Ambulatory Visit | Attending: Family Medicine | Admitting: Family Medicine

## 2013-05-24 ENCOUNTER — Other Ambulatory Visit: Payer: Self-pay | Admitting: Family Medicine

## 2013-05-24 DIAGNOSIS — R05 Cough: Secondary | ICD-10-CM

## 2013-05-26 ENCOUNTER — Encounter: Payer: Self-pay | Admitting: Podiatrist

## 2013-05-26 ENCOUNTER — Ambulatory Visit (INDEPENDENT_AMBULATORY_CARE_PROVIDER_SITE_OTHER): Payer: Medicare Other | Admitting: Podiatrist

## 2013-05-26 ENCOUNTER — Other Ambulatory Visit: Payer: Self-pay | Admitting: Family Medicine

## 2013-05-26 VITALS — BP 113/78 | HR 88 | Resp 16

## 2013-05-26 DIAGNOSIS — G319 Degenerative disease of nervous system, unspecified: Secondary | ICD-10-CM

## 2013-05-26 DIAGNOSIS — I679 Cerebrovascular disease, unspecified: Secondary | ICD-10-CM

## 2013-05-26 DIAGNOSIS — R9389 Abnormal findings on diagnostic imaging of other specified body structures: Secondary | ICD-10-CM

## 2013-05-26 DIAGNOSIS — M79609 Pain in unspecified limb: Secondary | ICD-10-CM

## 2013-05-26 DIAGNOSIS — Q828 Other specified congenital malformations of skin: Secondary | ICD-10-CM

## 2013-05-26 HISTORY — DX: Cerebrovascular disease, unspecified: I67.9

## 2013-05-26 HISTORY — DX: Degenerative disease of nervous system, unspecified: G31.9

## 2013-05-30 ENCOUNTER — Ambulatory Visit
Admission: RE | Admit: 2013-05-30 | Discharge: 2013-05-30 | Disposition: A | Discharge status: Home / Self Care | Payer: Medicare Other | Source: Ambulatory Visit | Attending: Family Medicine | Admitting: Family Medicine

## 2013-05-30 DIAGNOSIS — R9389 Abnormal findings on diagnostic imaging of other specified body structures: Secondary | ICD-10-CM

## 2013-05-30 MED ORDER — IOHEXOL 300 MG/ML  SOLN
75.0000 mL | Freq: Once | INTRAMUSCULAR | Status: AC | PRN
  Administered 2013-05-30: 75 mL via INTRAVENOUS

## 2013-05-31 ENCOUNTER — Inpatient Hospital Stay
Admission: RE | Admit: 2013-05-31 | Discharge: 2013-05-31 | Disposition: A | Discharge status: Home / Self Care | Payer: Medicare Other | Source: Ambulatory Visit | Attending: Family Medicine | Admitting: Family Medicine

## 2013-05-31 DIAGNOSIS — R9389 Abnormal findings on diagnostic imaging of other specified body structures: Secondary | ICD-10-CM

## 2013-05-31 NOTE — Progress Notes (Signed)
HPI: Patient presents today for follow up of foot and callus care. Denies any new complaints today. Relates the calluses are painful with shoes and with ambulation    Objective: Patients chart is reviewed. Neurovascular status unchanged. Patient complains of calluses submetatarsal one and 5 of the right foot. Intact integument is noted beneath the lesions. Pain with direct pressure is also noted.    Assessment: Symptomatic calluses x 2    Plan: Discussed treatment options and alternatives. The symptomatic calluses were debrided without complication. Return prn   

## 2013-06-03 ENCOUNTER — Encounter: Payer: Self-pay | Admitting: Internal Medicine

## 2013-06-03 ENCOUNTER — Ambulatory Visit (INDEPENDENT_AMBULATORY_CARE_PROVIDER_SITE_OTHER): Payer: Medicare Other | Admitting: Internal Medicine

## 2013-06-03 ENCOUNTER — Ambulatory Visit: Payer: Medicare Other | Admitting: Internal Medicine

## 2013-06-03 VITALS — BP 136/94 | HR 80 | Temp 97.6°F | Ht 67.0 in | Wt 188.6 lb

## 2013-06-03 DIAGNOSIS — R9389 Abnormal findings on diagnostic imaging of other specified body structures: Secondary | ICD-10-CM

## 2013-06-03 DIAGNOSIS — J449 Chronic obstructive pulmonary disease, unspecified: Secondary | ICD-10-CM

## 2013-06-03 MED ORDER — LEVOFLOXACIN 750 MG PO TABS: 750.0000 mg | ORAL_TABLET | Freq: Every day | ORAL | Status: DC

## 2013-06-03 MED ORDER — PREDNISONE (PAK) 10 MG PO TABS: ORAL_TABLET | ORAL | Status: DC

## 2013-06-03 NOTE — Progress Notes (Signed)
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   rec tudorza is one puff twice daily> no better so d/c'd Only use your albuterol as a rescue medication     06/03/2013 acute  ov/Jany Buckwalter re: prod cough/ acute onset Chief Complaint  Patient presents with  . Follow-up    Pt c/o prod cough with yellow mucous X3wks.  Nasal and sinus congestion.        Already rx with amox no better> CT ? MAI    No obvious daytime variabilty or assoc sob over baseline or cp or chest tightness, subjective wheeze overt   hb symptoms. No unusual exp hx or h/o childhood pna/ asthma or premature birth to her  knowledge.   Sleeping ok without nocturnal  or early am exacerbation  of respiratory  c/o's or need for noct saba. Also denies any obvious fluctuation of symptoms with weather or environmental changes or other aggravating or alleviating factors except as outlined above   ROS  The following are not active complaints unless bolded sore throat, dysphagia, dental problems, itching, sneezing,  nasal congestion or excess/ purulent secretions, ear ache,   fever, chills, sweats, unintended wt loss, pleuritic or exertional cp, hemoptysis,  orthopnea pnd or leg swelling, presyncope, palpitations, heartburn, abdominal pain, anorexia, nausea, vomiting, diarrhea  or change in bowel or urinary habits, change in stools or urine, dysuria,hematuria,  rash, arthralgias, visual complaints, headache, numbness weakness or ataxia or problems with walking or coordination,  change in mood/affect or memory.           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 Oropharynx no thrush or excess pnd or cobblestoning. No JVD or cervical adenopathy. Mild accessory muscle hypertrophy. Trachea midline, nl thryroid. Chest : coarse BS w/ no wheezing  Regular rate and rhythm without murmur gallop or rub or increase P2 or edema. Abd: no hsm, nl excursion. Ext warm without cyanosis or clubbing.     05/30/13  CT w/contrast 1. 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 The  extent of infection is overall similar to 06/04/2005, with some  areas progressive and some regressive. An area within the posterior  left upper lobe for which acute or subacute disease cannot be  excluded, given clinical symptoms.  2. No evidence of pulmonary nodule or mass. The plain film  abnormality was secondary to lingular volume loss and  bronchiectasis.  3. Development of mild ascending aortic aneurysm.  4. Coronary artery atherosclerosis.       Assessment & Plan:

## 2013-06-03 NOTE — Patient Instructions (Signed)
levaquin 750 mg x  5 days  Prednisone 10 mg take  4 each am x 2 days,   2 each am x 2 days,  1 each am x 2 days and stop   Please schedule a follow up office visit in 4 weeks, call sooner if needed with cxr on return

## 2013-06-05 DIAGNOSIS — J479 Bronchiectasis, uncomplicated: Secondary | ICD-10-CM | POA: Insufficient documentation

## 2013-06-05 NOTE — Assessment & Plan Note (Signed)
Although there are clearly abnormalities on CT scan, they should probably be considered "microscopic" since not obvious on plain cxr .     In the setting of obvious "macroscopic" health issues,  I am very reluctatnt to embark on an invasive w/u at this point but will arrange consevative  follow up (with serial cxr's) and in the meantime see what we can do to address the patient's subjective concerns.    See instructions for specific recommendations which were reviewed directly with the patient who was given a copy with highlighter outlining the key components.

## 2013-06-05 NOTE — Assessment & Plan Note (Signed)
-   PFT's 09/15/2005 FEV1 1.69 (76%) with ratio 67 and better x 14% p B2, DLC 95%  - PFT's 11/11/2010 FEV1 1.32 (63%) with ratio 62 and no better p B2, DLC0 66%  - PFT's 08/25/2012 FEV1 1.56 (76%) with ratio 66 and no beter p B2, DLCO 88% - HFA 100% 06/25/2012  - Add tudorza 06/25/2012 > no better so d/cd  Mild flare in setting of purulent tracheobronchitis ? Related to ct findings suggesting MAI > rec levaquin 750 mg one daily x 5 days and if not better then sinus ct then consider fob     Each maintenance medication was reviewed in detail including most importantly the difference between maintenance and as needed and under what circumstances the prns are to be used.  Please see instructions for details which were reviewed in writing and the patient given a copy.

## 2013-06-09 ENCOUNTER — Ambulatory Visit: Payer: Medicare Other | Admitting: Internal Medicine

## 2013-07-01 ENCOUNTER — Encounter: Payer: Self-pay | Admitting: Internal Medicine

## 2013-07-01 ENCOUNTER — Ambulatory Visit (INDEPENDENT_AMBULATORY_CARE_PROVIDER_SITE_OTHER): Payer: Medicare Other | Admitting: Internal Medicine

## 2013-07-01 VITALS — BP 134/78 | HR 77 | Temp 97.5°F | Ht 67.0 in | Wt 187.0 lb

## 2013-07-01 DIAGNOSIS — J449 Chronic obstructive pulmonary disease, unspecified: Secondary | ICD-10-CM

## 2013-07-01 DIAGNOSIS — J479 Bronchiectasis, uncomplicated: Secondary | ICD-10-CM

## 2013-07-01 MED ORDER — ALBUTEROL SULFATE HFA 108 (90 BASE) MCG/ACT IN AERS: 2.0000 | INHALATION_SPRAY | Freq: Four times a day (QID) | RESPIRATORY_TRACT | Status: DC | PRN

## 2013-07-01 NOTE — Progress Notes (Signed)
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   rec tudorza is one puff twice daily> no better so d/c'd Only use your albuterol as a rescue medication     06/03/2013 acute  ov/Oland Arquette re: prod cough/ acute onset Chief Complaint  Patient presents with  . Follow-up    Pt c/o prod cough with yellow mucous X3wks.  Nasal and sinus congestion.       Already rx with amox no better> CT ? MAI  rec levaquin 750 mg x  5 days Prednisone 10 mg take  4 each am x 2 days,   2 each am x 2 days,  1 each am x 2 days and stop    07/01/2013 f/u ov/Jachelle Fluty re: copd/ lingular bronchiectasis/ ? MAI  Chief Complaint  Patient presents with  . Follow-up    Pt c/o congestion, some SOB with exertion.  s/s have improved since lv    Not using symbicort first thing Only using rescue now 2-3 x since previous ov  May need joint replacement  Has sense of chest congestion but no excess or purulent secretions   No obvious daytime variabilty or assoc sob over baseline or cp or chest tightness, subjective wheeze overt   hb symptoms. No unusual exp hx or h/o childhood pna/ asthma or premature birth to her  knowledge.   Sleeping ok without nocturnal  or early am exacerbation  of respiratory  c/o's or need for noct saba. Also denies any obvious fluctuation of symptoms with weather or environmental changes or other aggravating or alleviating factors except as outlined above   ROS  The following are not active complaints unless bolded sore throat, dysphagia, dental problems, itching, sneezing,   nasal congestion or excess/ purulent secretions, ear ache,   fever, chills, sweats, unintended wt loss, pleuritic or exertional cp, hemoptysis,  orthopnea pnd or leg swelling, presyncope, palpitations, heartburn, abdominal pain, anorexia, nausea, vomiting, diarrhea  or change in bowel or urinary habits, change in stools or urine, dysuria,hematuria,  rash, arthralgias, visual complaints, headache, numbness weakness or ataxia or problems with walking or coordination,  change in mood/affect or memory.          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 Oropharynx no thrush or excess pnd or cobblestoning. No JVD or cervical adenopathy. Mild accessory muscle hypertrophy. Trachea midline, nl thryroid. Chest : coarse BS w/ no wheezing  Regular rate and rhythm without murmur gallop or rub or increase P2 or edema. Abd: no hsm, nl excursion. Ext warm without cyanosis or clubbing.      05/30/13 CT w/contrast 1. 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 The  extent of  infection is overall similar to 06/04/2005, with some  areas progressive and some regressive. An area within the posterior  left upper lobe for which acute or subacute disease cannot be  excluded, given clinical symptoms.  2. No evidence of pulmonary nodule or mass. The plain film  abnormality was secondary to lingular volume loss and  bronchiectasis.  3. Development of mild ascending aortic aneurysm.  4. Coronary artery atherosclerosis.       Assessment & Plan:

## 2013-07-01 NOTE — Patient Instructions (Addendum)
Plan A = automatic = Symbicort 160 Take 2 puffs first thing in am and then another 2 puffs about 12 hours later.    Plan B= Backup = Ventolin = albuterol Only use your albuterol as a rescue medication to be used if you can't catch your breath by resting or doing a relaxed purse lip breathing pattern.  - The less you use it, the better it will work when you need it. - Ok to use up to every 2 puffs every  4 hours if you must but call for immediate appointment if use goes up over your usual need - Don't leave home without it !!  (think of it like your spare tire for your car)    Please schedule a follow up office visit in 6 weeks, call sooner if needed with pfts and cxr

## 2013-07-02 NOTE — Assessment & Plan Note (Signed)
See CT chest 05/30/13 > she probably does have low grade MAI but with limited bronchiectasis and no overall change since 2006 it is most likely saprophytic, not pathogenic > rec short course levaquin prn exac as proved very effective at last ov

## 2013-07-02 NOTE — Assessment & Plan Note (Addendum)
PFT's 09/15/2005 FEV1 1.69 (76%) with ratio 67 and better x 14% p B2, DLC 95%  - PFT's 11/11/2010 FEV1 1.32 (63%) with ratio 62 and no better p B2, DLC0 66%  - PFT's 08/25/2012 FEV1 1.56 (76%) with ratio 66 and no beter p B2, DLCO 88% - HFA 100% 06/25/2012  - Add tudorza 06/25/2012 > no better so d/c'd  Adequate control on present rx, reviewed > no change in rx needed    If considering joint replacement next year will need fresh cxr/ pft's to clear for surgery.    Each maintenance medication was reviewed in detail including most importantly the difference between maintenance and as needed and under what circumstances the prns are to be used.  Please see instructions for details which were reviewed in writing and the patient given a copy.

## 2013-08-08 ENCOUNTER — Other Ambulatory Visit (HOSPITAL_COMMUNITY): Payer: Self-pay | Admitting: Orthopedic Surgery

## 2013-08-08 DIAGNOSIS — M25561 Pain in right knee: Secondary | ICD-10-CM

## 2013-08-08 DIAGNOSIS — M25562 Pain in left knee: Principal | ICD-10-CM

## 2013-08-11 ENCOUNTER — Other Ambulatory Visit: Payer: Self-pay | Admitting: Internal Medicine

## 2013-08-11 DIAGNOSIS — J449 Chronic obstructive pulmonary disease, unspecified: Secondary | ICD-10-CM

## 2013-08-12 ENCOUNTER — Ambulatory Visit (INDEPENDENT_AMBULATORY_CARE_PROVIDER_SITE_OTHER): Payer: PRIVATE HEALTH INSURANCE | Admitting: Internal Medicine

## 2013-08-12 ENCOUNTER — Encounter: Payer: Self-pay | Admitting: Internal Medicine

## 2013-08-12 VITALS — BP 120/88 | HR 74 | Temp 97.3°F | Ht 66.0 in | Wt 183.0 lb

## 2013-08-12 DIAGNOSIS — J449 Chronic obstructive pulmonary disease, unspecified: Secondary | ICD-10-CM

## 2013-08-12 DIAGNOSIS — J4489 Other specified chronic obstructive pulmonary disease: Secondary | ICD-10-CM

## 2013-08-12 DIAGNOSIS — J479 Bronchiectasis, uncomplicated: Secondary | ICD-10-CM

## 2013-08-12 NOTE — Patient Instructions (Signed)
Your copd is mild and moderate and well controlled on symbicort 160 Take 2 puffs first thing in am and then another 2 puffs about 12 hours later.  So you are cleared for orthopedic surgery   If you are satisfied with your treatment plan let your doctor know and he/she can either refill your medications or you can return here when your prescription runs out.     If in any way you are not 100% satisfied,  please tell us.  If 100% better, tell your friends!

## 2013-08-12 NOTE — Progress Notes (Signed)
PFT done today. 

## 2013-08-13 ENCOUNTER — Encounter: Payer: Self-pay | Admitting: Internal Medicine

## 2013-08-13 NOTE — Progress Notes (Signed)
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   rec tudorza is one puff twice daily> no better so d/c'd Only use your albuterol as a rescue medication     06/03/2013 acute  ov/Analiyah Lechuga re: prod cough/ acute onset Chief Complaint  Patient presents with  . Follow-up    Pt c/o prod cough with yellow mucous X3wks.  Nasal and sinus congestion.       Already rx with amox no better> CT ? MAI  rec levaquin 750 mg x  5 days Prednisone 10 mg take  4 each am x 2 days,   2 each am x 2 days,  1 each am x 2 days and stop    07/01/2013 f/u ov/Greig Altergott re: copd/ lingular bronchiectasis/ ? MAI  Chief Complaint  Patient presents with  . Follow-up    Pt c/o congestion, some SOB with exertion.  s/s have improved since lv    Not using symbicort first thing Only using rescue now 2-3 x since previous ov  May need joint replacement  Has sense of chest congestion but no excess or purulent secretions  rec Plan A = automatic = Symbicort 160 Take 2 puffs first thing in am and then another 2 puffs about 12 hours later.  Plan B= Backup = Ventolin = albuterol   08/13/2013 f/u ov/Mariabella Nilsen re: copd GOLD II Chief Complaint  Patient presents with  . Followup with PFT    Pt states her breathing is overall doing well.     On symbicort 160 2bid and no need at all for saba   No obvious daytime variabilty or assoc sob over baseline or cough/ cp or chest tightness, subjective wheeze overt   hb symptoms. No unusual exp hx or h/o childhood pna/ asthma or premature birth to her  knowledge.    Sleeping ok without nocturnal  or early am exacerbation  of respiratory  c/o's or need for noct saba. Also denies any obvious fluctuation of symptoms with weather or environmental changes or other aggravating or alleviating factors except as outlined above   ROS  The following are not active complaints unless bolded sore throat, dysphagia, dental problems, itching, sneezing,  nasal congestion or excess/ purulent secretions, ear ache,   fever, chills, sweats, unintended wt loss, pleuritic or exertional cp, hemoptysis,  orthopnea pnd or leg swelling, presyncope, palpitations, heartburn, abdominal pain, anorexia, nausea, vomiting, diarrhea  or change in bowel or urinary habits, change in stools or urine, dysuria,hematuria,  rash, arthralgias, visual complaints, headache, numbness weakness or ataxia or problems with walking or coordination,  change in mood/affect or memory.          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 Oropharynx no thrush or excess pnd or cobblestoning. No JVD or cervical adenopathy. Mild accessory muscle  hypertrophy. Trachea midline, nl thryroid. Chest : coarse BS w/ no wheezing  Regular rate and rhythm without murmur gallop or rub or increase P2 or edema. Abd: no hsm, nl excursion. Ext warm without cyanosis or clubbing.      05/30/13 CT w/contrast 1. 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 The  extent of infection is overall similar to 06/04/2005, with some  areas progressive and some regressive. An area within the posterior  left upper lobe for which acute or subacute disease cannot be  excluded, given clinical symptoms.  2. No evidence of pulmonary nodule or mass. The plain film  abnormality was secondary to lingular volume loss and  bronchiectasis.  3. Development of mild ascending aortic aneurysm.  4. Coronary artery atherosclerosis.       Assessment & Plan:

## 2013-08-13 NOTE — Assessment & Plan Note (Signed)
See CT chest 05/30/13  No active infection apparent, no contraindication to orthopedic surgery

## 2013-08-13 NOTE — Assessment & Plan Note (Signed)
Quit smoking  02/2011  - PFT's 09/15/2005 FEV1 1.69 (76%) with ratio 67 and better x 14% p B2, DLC 95%  - PFT's 11/11/2010 FEV1 1.32 (63%) with ratio 62 and no better p B2, DLC0 66%  - PFT's 08/25/2012 FEV1 1.56 (76%) with ratio 66 and no better p B2, DLCO 88% - PFTs 08/12/2013 FEV1   1.37 (64%) ratio 64 with no better B2, DLCO 59 corrects 77% p am symbicort - HFA 100% 06/25/2012   Mild / moderate airflow obst s limiting sob or tendency to aecopd >  Adequate control on present rx, reviewed > no change in rx needed    Discussed in detail all the  indications, usual  risks and alternatives  relative to the benefits with patient who agrees to proceed with orhtopedic surgery as planned with inpt pulmonary f/u prn

## 2013-08-15 ENCOUNTER — Encounter (HOSPITAL_COMMUNITY)
Admission: RE | Admit: 2013-08-15 | Discharge: 2013-08-15 | Disposition: A | Discharge status: Home / Self Care | Payer: PRIVATE HEALTH INSURANCE | Source: Ambulatory Visit | Attending: Orthopedic Surgery | Admitting: Orthopedic Surgery

## 2013-08-15 DIAGNOSIS — M25562 Pain in left knee: Secondary | ICD-10-CM

## 2013-08-15 DIAGNOSIS — M25569 Pain in unspecified knee: Secondary | ICD-10-CM | POA: Insufficient documentation

## 2013-08-15 DIAGNOSIS — M25561 Pain in right knee: Secondary | ICD-10-CM

## 2013-08-15 MED ORDER — TECHNETIUM TC 99M MEDRONATE IV KIT
25.0000 | PACK | Freq: Once | INTRAVENOUS | Status: AC | PRN
  Administered 2013-08-15: 25 via INTRAVENOUS

## 2013-08-28 NOTE — H&P (Signed)
TOTAL HIP ADMISSION H&P  Patient is admitted for left total hip arthroplasty, anterior approach.  Subjective:  Chief Complaint:      Left hip OA / pain  HPI: [name] [name], 80 y.o. female, has a history of pain and functional disability in the left hip(s) due to arthritis and patient has failed non-surgical conservative treatments for greater than 12 weeks to include NSAID's and/or analgesics and activity modification.  Onset of symptoms was gradual starting years ago with gradually worsening course since that time.The patient noted no past surgery on the left hip(s).  Patient currently rates pain in the left hip at 8 out of 10 with activity. Patient has night pain, worsening of pain with activity and weight bearing, trendelenberg gait, pain that interfers with activities of daily living and pain with passive range of motion. Patient has evidence of periarticular osteophytes and joint space narrowing by imaging studies. This condition presents safety issues increasing the risk of falls.  There is no current active infection.  Risks, benefits and expectations were discussed with the patient.  Risks including but not limited to the risk of anesthesia, blood clots, nerve damage, blood vessel damage, failure of the prosthesis, infection and up to and including death.  Patient understand the risks, benefits and expectations and wishes to proceed with surgery.   D/C Plans:   [facility]  Post-op Meds:    No Rx given  Tranexamic Acid:   To be given  Decadron:    To be given  FYI:    Xarelto post-op  Norco post-op  Dilaudid good to use  Patient Active Problem List   Diagnosis Date Noted  . Bronchiectasis 06/05/2013  . HYPERTENSION 07/09/2007  . COPD GOLD II 07/09/2007   Past Medical History  Diagnosis Date  . COPD (chronic obstructive pulmonary disease)     pft's 09/15/2005, FEV1 1.69 (76%) with ration67 and better x 14% p B2, DLC 95%  . Chronic cough   . Asthma   . Hypertension      Past Surgical History  Procedure Laterality Date  . Nasal sinus surgery    . Total knee arthroplasty    . Excision morton's neuroma      left foot  . Total hip arthroplasty  2009    right    No prescriptions prior to admission   Allergies  Allergen Reactions  . Morphine     REACTION: hives    History  Substance Use Topics  . Smoking status: Former Smoker -- 0.50 packs/day for 60 years    Types: Cigarettes    Quit date: 02/05/2011  . Smokeless tobacco: Never Used  . Alcohol Use: Yes     Comment: Wine and Gin daily    Family History  Problem Relation Age of Onset  . Heart disease Father 82    heart attack  . Prostate cancer Paternal Grandfather   . Pulmonary fibrosis Brother     former smoker     Review of Systems  Constitutional: Positive for malaise/fatigue.  HENT: Negative.   Eyes: Negative.   Respiratory: Positive for shortness of breath (on exertion).   Cardiovascular: Negative.   Gastrointestinal: Negative.   Genitourinary: Positive for frequency.  Musculoskeletal: Positive for joint pain and myalgias.  Skin: Positive for rash.  Neurological: Negative.   Endo/Heme/Allergies: Negative.   Psychiatric/Behavioral: Negative.     Objective:  Physical Exam  Constitutional: She is oriented to person, place, and time. She appears well-developed and well-nourished.  HENT:  Head: Normocephalic and atraumatic.  Mouth/Throat: Oropharynx is clear and moist.  Eyes: Pupils are equal, round, and reactive to light.  Neck: Neck supple. No JVD present. No tracheal deviation present. No thyromegaly present.  Cardiovascular: Normal rate, regular rhythm, normal heart sounds and intact distal pulses.   Respiratory: Effort normal and breath sounds normal. No stridor. No respiratory distress. She has no wheezes.  GI: Soft. There is no tenderness. There is no guarding.  Musculoskeletal:       Left hip: She exhibits decreased range of motion, decreased strength, tenderness and  bony tenderness. She exhibits no swelling, no deformity and no laceration.  Lymphadenopathy:    She has no cervical adenopathy.  Neurological: She is alert and oriented to person, place, and time.  Skin: Skin is warm and dry.  Psychiatric: She has a normal mood and affect.    Labs:  Estimated body mass index is 29.28 kg/(m^2) as calculated from the following:   Height as of 07/01/13: 5\' 7"  (1.702 m).   Weight as of 07/01/13: 84.823 kg (187 lb).   Imaging Review Plain radiographs demonstrate severe degenerative joint disease of the left hip(s). The bone quality appears to be good for age and reported activity level.  Assessment/Plan:  End stage arthritis, left hip(s)  The patient history, physical examination, clinical judgement of the provider and imaging studies are consistent with end stage degenerative joint disease of the left hip(s) and total hip arthroplasty is deemed medically necessary. The treatment options including medical management, injection therapy, arthroscopy and arthroplasty were discussed at length. The risks and benefits of total hip arthroplasty were presented and reviewed. The risks due to aseptic loosening, infection, stiffness, dislocation/subluxation,  thromboembolic complications and other imponderables were discussed.  The patient acknowledged the explanation, agreed to proceed with the plan and consent was signed. Patient is being admitted for inpatient treatment for surgery, pain control, PT, OT, prophylactic antibiotics, VTE prophylaxis, progressive ambulation and ADL's and discharge planning.The patient is planning to be discharged [facility].    [place] Pugh [name]   PAC  08/28/2013, 9:32 PM

## 2013-08-30 ENCOUNTER — Encounter (HOSPITAL_COMMUNITY): Payer: Self-pay | Admitting: Pharmacy Technician

## 2013-09-01 ENCOUNTER — Encounter (HOSPITAL_COMMUNITY): Payer: Self-pay

## 2013-09-01 ENCOUNTER — Encounter (HOSPITAL_COMMUNITY)
Admission: RE | Admit: 2013-09-01 | Discharge: 2013-09-01 | Disposition: A | Discharge status: Home / Self Care | Payer: PRIVATE HEALTH INSURANCE | Source: Ambulatory Visit | Attending: Orthopedic Surgery | Admitting: Orthopedic Surgery

## 2013-09-01 DIAGNOSIS — Z01812 Encounter for preprocedural laboratory examination: Secondary | ICD-10-CM | POA: Insufficient documentation

## 2013-09-01 DIAGNOSIS — Z01818 Encounter for other preprocedural examination: Secondary | ICD-10-CM | POA: Insufficient documentation

## 2013-09-01 HISTORY — DX: Unspecified chronic bronchitis: J42

## 2013-09-01 HISTORY — DX: Celiac disease: K90.0

## 2013-09-01 HISTORY — DX: Anemia, unspecified: D64.9

## 2013-09-01 HISTORY — DX: Pneumonia, unspecified organism: J18.9

## 2013-09-01 HISTORY — DX: Anxiety disorder, unspecified: F41.9

## 2013-09-01 LAB — URINE MICROSCOPIC-ADD ON

## 2013-09-01 LAB — BASIC METABOLIC PANEL
BUN: 23 mg/dL (ref 6–23)
CO2: 28 mEq/L (ref 19–32)
CREATININE: 0.55 mg/dL (ref 0.50–1.10)
Calcium: 9.3 mg/dL (ref 8.4–10.5)
Chloride: 95 mEq/L — ABNORMAL LOW (ref 96–112)
GFR, EST NON AFRICAN AMERICAN: 86 mL/min — AB (ref >90)
Glucose, Bld: 112 mg/dL — ABNORMAL HIGH (ref 70–99)
Potassium: 4.3 mEq/L (ref 3.7–5.3)
Sodium: 135 mEq/L — ABNORMAL LOW (ref 137–147)

## 2013-09-01 LAB — CBC
HCT: 41.1 % (ref 36.0–46.0)
Hemoglobin: 13 g/dL (ref 12.0–15.0)
MCH: 31.9 pg (ref 26.0–34.0)
MCHC: 31.6 g/dL (ref 30.0–36.0)
MCV: 101 fL — ABNORMAL HIGH (ref 78.0–100.0)
Platelets: 237 10*3/uL (ref 150–400)
RBC: 4.07 MIL/uL (ref 3.87–5.11)
RDW: 13 % (ref 11.5–15.5)
WBC: 6.5 10*3/uL (ref 4.0–10.5)

## 2013-09-01 LAB — PROTIME-INR
INR: 0.91 (ref 0.00–1.49)
Prothrombin Time: 12.1 seconds (ref 11.6–15.2)

## 2013-09-01 LAB — URINALYSIS, ROUTINE W REFLEX MICROSCOPIC
BILIRUBIN URINE: NEGATIVE
GLUCOSE, UA: NEGATIVE mg/dL
Hgb urine dipstick: NEGATIVE
KETONES UR: NEGATIVE mg/dL
Nitrite: NEGATIVE
PH: 5.5 (ref 5.0–8.0)
Protein, ur: NEGATIVE mg/dL
Specific Gravity, Urine: 1.025 (ref 1.005–1.030)
Urobilinogen, UA: 0.2 mg/dL (ref 0.0–1.0)

## 2013-09-01 LAB — SURGICAL PCR SCREEN
MRSA, PCR: NEGATIVE
STAPHYLOCOCCUS AUREUS: NEGATIVE

## 2013-09-01 LAB — APTT: APTT: 26 s (ref 24–37)

## 2013-09-01 NOTE — Progress Notes (Signed)
Abnormal CT results from 05/30/13 faxed to Dr. Alvan Dame via [facility] and shown to Dr.Denenny with anesthesia. Dr. Delma Post requested Chest x-ray be done. Order placed for Chest x-ray morning of surgery

## 2013-09-01 NOTE — Progress Notes (Addendum)
EKG 08/31/13 on chart, abnormal chest x-ray and CT chest 05/2013 on EPIC, surgery clearance note 08/29/13 Dr. Laurann Montana on chart, [place] note Dr. Laurann Montana 08/29/13 07/12/13 on chart

## 2013-09-01 NOTE — Patient Instructions (Signed)
22 [name] [name]  09/01/2013   Your procedure is scheduled on: 09/06/13  Report to [place] at 12:00 PM.  Call this number if you have problems the morning of surgery [phone]   Remember: No visitors under age 78 due to [facility] policy, please bring inhaler on day of surgery    Do not eat food After Midnight, clear liquids from midnight until 0900 on 09/06/13 then nothing.      Take these medicines the morning of surgery with A SIP OF WATER: xanax if needed, inhaler if needed   Do not wear jewelry, make-up or nail polish.  Do not wear lotions, powders, or perfumes. You may wear deodorant.  Do not shave 48 hours prior to surgery. Men may shave face and neck.  Do not bring valuables to the hospital.  Contacts, dentures or bridgework may not be worn into surgery.  Leave suitcase in the car. After surgery it may be brought to your room.  For patients admitted to the hospital, checkout time is 11:00 AM the day of discharge.   Please read over the following fact sheets that you were given: MRSA Information, incentive spirometry fact sheet, blood fact sheet Paulette Blanch, RN  pre op nurse call if needed [phone]    FAILURE TO [place]   Patient Signature: ___________________________________________

## 2013-09-02 LAB — ABO/RH: ABO/RH(D): B NEG

## 2013-09-06 ENCOUNTER — Inpatient Hospital Stay (HOSPITAL_COMMUNITY)
Admission: RE | Admit: 2013-09-06 | Discharge: 2013-09-08 | DRG: 470 | Disposition: A | Discharge status: Skilled Nursing Facility | Payer: Medicare Other | Source: Ambulatory Visit | Attending: Orthopedic Surgery | Admitting: Orthopedic Surgery

## 2013-09-06 ENCOUNTER — Encounter (HOSPITAL_COMMUNITY)
Admission: RE | Disposition: A | Discharge status: Skilled Nursing Facility | Payer: Self-pay | Source: Ambulatory Visit | Attending: Orthopedic Surgery

## 2013-09-06 ENCOUNTER — Inpatient Hospital Stay (HOSPITAL_COMMUNITY): Payer: Medicare Other

## 2013-09-06 ENCOUNTER — Inpatient Hospital Stay (HOSPITAL_COMMUNITY): Payer: Medicare Other | Admitting: Anesthesiology

## 2013-09-06 ENCOUNTER — Encounter (HOSPITAL_COMMUNITY): Payer: Self-pay | Admitting: *Deleted

## 2013-09-06 ENCOUNTER — Encounter (HOSPITAL_COMMUNITY): Payer: Medicare Other | Admitting: Anesthesiology

## 2013-09-06 DIAGNOSIS — I10 Essential (primary) hypertension: Secondary | ICD-10-CM | POA: Diagnosis present

## 2013-09-06 DIAGNOSIS — J449 Chronic obstructive pulmonary disease, unspecified: Secondary | ICD-10-CM | POA: Diagnosis present

## 2013-09-06 DIAGNOSIS — M169 Osteoarthritis of hip, unspecified: Principal | ICD-10-CM | POA: Diagnosis present

## 2013-09-06 DIAGNOSIS — Z87891 Personal history of nicotine dependence: Secondary | ICD-10-CM

## 2013-09-06 DIAGNOSIS — K900 Celiac disease: Secondary | ICD-10-CM | POA: Diagnosis present

## 2013-09-06 DIAGNOSIS — D62 Acute posthemorrhagic anemia: Secondary | ICD-10-CM | POA: Diagnosis not present

## 2013-09-06 DIAGNOSIS — Z01812 Encounter for preprocedural laboratory examination: Secondary | ICD-10-CM

## 2013-09-06 DIAGNOSIS — E663 Overweight: Secondary | ICD-10-CM | POA: Diagnosis present

## 2013-09-06 DIAGNOSIS — Z96649 Presence of unspecified artificial hip joint: Secondary | ICD-10-CM

## 2013-09-06 DIAGNOSIS — Z96659 Presence of unspecified artificial knee joint: Secondary | ICD-10-CM

## 2013-09-06 DIAGNOSIS — M1610 Unilateral primary osteoarthritis, unspecified hip: Principal | ICD-10-CM | POA: Diagnosis present

## 2013-09-06 DIAGNOSIS — Z6828 Body mass index (BMI) 28.0-28.9, adult: Secondary | ICD-10-CM

## 2013-09-06 DIAGNOSIS — J479 Bronchiectasis, uncomplicated: Secondary | ICD-10-CM | POA: Diagnosis present

## 2013-09-06 DIAGNOSIS — Z8249 Family history of ischemic heart disease and other diseases of the circulatory system: Secondary | ICD-10-CM

## 2013-09-06 DIAGNOSIS — J4489 Other specified chronic obstructive pulmonary disease: Secondary | ICD-10-CM | POA: Diagnosis present

## 2013-09-06 HISTORY — PX: TOTAL HIP ARTHROPLASTY: SHX124

## 2013-09-06 LAB — TYPE AND SCREEN
ABO/RH(D): B NEG
Antibody Screen: NEGATIVE

## 2013-09-06 SURGERY — ARTHROPLASTY, HIP, TOTAL, ANTERIOR APPROACH
Anesthesia: Spinal | Site: Hip | Laterality: Left

## 2013-09-06 MED ORDER — CEFAZOLIN SODIUM-DEXTROSE 2-3 GM-% IV SOLR
2.0000 g | INTRAVENOUS | Status: AC
  Administered 2013-09-06: 2 g via INTRAVENOUS

## 2013-09-06 MED ORDER — ZOLPIDEM TARTRATE 5 MG PO TABS
5.0000 mg | ORAL_TABLET | Freq: Every evening | ORAL | Status: DC | PRN
  Administered 2013-09-07: 5 mg via ORAL
  Filled 2013-09-06: qty 1

## 2013-09-06 MED ORDER — FERROUS SULFATE 325 (65 FE) MG PO TABS
325.0000 mg | ORAL_TABLET | Freq: Three times a day (TID) | ORAL | Status: DC
  Administered 2013-09-07 – 2013-09-08 (×5): 325 mg via ORAL
  Filled 2013-09-06 (×9): qty 1

## 2013-09-06 MED ORDER — DEXAMETHASONE SODIUM PHOSPHATE 10 MG/ML IJ SOLN
10.0000 mg | Freq: Once | INTRAMUSCULAR | Status: AC
  Administered 2013-09-06: 10 mg via INTRAVENOUS

## 2013-09-06 MED ORDER — METOCLOPRAMIDE HCL 10 MG PO TABS: 5.0000 mg | ORAL_TABLET | Freq: Three times a day (TID) | ORAL | Status: DC | PRN

## 2013-09-06 MED ORDER — POLYETHYLENE GLYCOL 3350 17 G PO PACK
17.0000 g | PACK | Freq: Two times a day (BID) | ORAL | Status: DC
  Administered 2013-09-06 – 2013-09-08 (×4): 17 g via ORAL

## 2013-09-06 MED ORDER — PHENOL 1.4 % MT LIQD
1.0000 | OROMUCOSAL | Status: DC | PRN
  Filled 2013-09-06: qty 177

## 2013-09-06 MED ORDER — ALPRAZOLAM 0.25 MG PO TABS
0.2500 mg | ORAL_TABLET | Freq: Four times a day (QID) | ORAL | Status: DC | PRN
  Administered 2013-09-07: 0.25 mg via ORAL
  Filled 2013-09-06: qty 1

## 2013-09-06 MED ORDER — BUPIVACAINE IN DEXTROSE 0.75-8.25 % IT SOLN
INTRATHECAL | Status: DC | PRN
  Administered 2013-09-06: 2 mL via INTRATHECAL

## 2013-09-06 MED ORDER — METHOCARBAMOL 100 MG/ML IJ SOLN
500.0000 mg | Freq: Four times a day (QID) | INTRAVENOUS | Status: DC | PRN
  Filled 2013-09-06: qty 5

## 2013-09-06 MED ORDER — LOSARTAN POTASSIUM 50 MG PO TABS
100.0000 mg | ORAL_TABLET | Freq: Every evening | ORAL | Status: DC
  Administered 2013-09-06 – 2013-09-07 (×2): 100 mg via ORAL
  Filled 2013-09-06 (×3): qty 2

## 2013-09-06 MED ORDER — PROPOFOL 10 MG/ML IV BOLUS
INTRAVENOUS | Status: DC | PRN
  Administered 2013-09-06: 40 mg via INTRAVENOUS

## 2013-09-06 MED ORDER — DOCUSATE SODIUM 100 MG PO CAPS
100.0000 mg | ORAL_CAPSULE | Freq: Two times a day (BID) | ORAL | Status: DC
  Administered 2013-09-06 – 2013-09-08 (×4): 100 mg via ORAL

## 2013-09-06 MED ORDER — LACTATED RINGERS IV SOLN
INTRAVENOUS | Status: DC
Start: 2013-09-06 — End: 2013-09-06
  Administered 2013-09-06: 1000 mL via INTRAVENOUS

## 2013-09-06 MED ORDER — MENTHOL 3 MG MT LOZG
1.0000 | LOZENGE | OROMUCOSAL | Status: DC | PRN
  Filled 2013-09-06: qty 9

## 2013-09-06 MED ORDER — LACTATED RINGERS IV SOLN
INTRAVENOUS | Status: DC | PRN
  Administered 2013-09-06 (×2): via INTRAVENOUS

## 2013-09-06 MED ORDER — STERILE WATER FOR IRRIGATION IR SOLN
Status: DC | PRN
Start: 2013-09-06 — End: 2013-09-06
  Administered 2013-09-06: 1500 mL

## 2013-09-06 MED ORDER — ALUM & MAG HYDROXIDE-SIMETH 200-200-20 MG/5ML PO SUSP: 30.0000 mL | ORAL | Status: DC | PRN

## 2013-09-06 MED ORDER — BISACODYL 10 MG RE SUPP: 10.0000 mg | Freq: Every day | RECTAL | Status: DC | PRN

## 2013-09-06 MED ORDER — CEFAZOLIN SODIUM-DEXTROSE 2-3 GM-% IV SOLR
2.0000 g | Freq: Four times a day (QID) | INTRAVENOUS | Status: AC
  Administered 2013-09-06 – 2013-09-07 (×2): 2 g via INTRAVENOUS
  Filled 2013-09-06 (×2): qty 50

## 2013-09-06 MED ORDER — PHENYLEPHRINE HCL 10 MG/ML IJ SOLN
INTRAMUSCULAR | Status: DC | PRN
  Administered 2013-09-06: 80 ug via INTRAVENOUS

## 2013-09-06 MED ORDER — PROMETHAZINE HCL 25 MG/ML IJ SOLN: 6.2500 mg | INTRAMUSCULAR | Status: DC | PRN

## 2013-09-06 MED ORDER — DEXTROSE 5 % IV SOLN
10.0000 mg | INTRAVENOUS | Status: DC | PRN
  Administered 2013-09-06: 25 ug/min via INTRAVENOUS

## 2013-09-06 MED ORDER — FENTANYL CITRATE 0.05 MG/ML IJ SOLN
25.0000 ug | INTRAMUSCULAR | Status: DC | PRN
  Administered 2013-09-06 (×2): 50 ug via INTRAVENOUS

## 2013-09-06 MED ORDER — METHOCARBAMOL 500 MG PO TABS
500.0000 mg | ORAL_TABLET | Freq: Four times a day (QID) | ORAL | Status: DC | PRN
  Administered 2013-09-08: 500 mg via ORAL
  Filled 2013-09-06: qty 1

## 2013-09-06 MED ORDER — ONDANSETRON HCL 4 MG PO TABS: 4.0000 mg | ORAL_TABLET | Freq: Four times a day (QID) | ORAL | Status: DC | PRN

## 2013-09-06 MED ORDER — METOCLOPRAMIDE HCL 5 MG/ML IJ SOLN: 5.0000 mg | Freq: Three times a day (TID) | INTRAMUSCULAR | Status: DC | PRN

## 2013-09-06 MED ORDER — ONDANSETRON HCL 4 MG/2ML IJ SOLN: 4.0000 mg | Freq: Four times a day (QID) | INTRAMUSCULAR | Status: DC | PRN

## 2013-09-06 MED ORDER — FLEET ENEMA 7-19 GM/118ML RE ENEM: 1.0000 | ENEMA | Freq: Once | RECTAL | Status: AC | PRN

## 2013-09-06 MED ORDER — HYDROMORPHONE HCL PF 1 MG/ML IJ SOLN
0.5000 mg | INTRAMUSCULAR | Status: DC | PRN
  Administered 2013-09-06 (×2): 1 mg via INTRAVENOUS
  Administered 2013-09-06: 0.5 mg via INTRAVENOUS
  Filled 2013-09-06 (×3): qty 1

## 2013-09-06 MED ORDER — DEXAMETHASONE SODIUM PHOSPHATE 10 MG/ML IJ SOLN
10.0000 mg | Freq: Once | INTRAMUSCULAR | Status: AC
  Administered 2013-09-07: 10 mg via INTRAVENOUS
  Filled 2013-09-06: qty 1

## 2013-09-06 MED ORDER — DEXAMETHASONE SODIUM PHOSPHATE 10 MG/ML IJ SOLN
INTRAMUSCULAR | Status: AC
  Filled 2013-09-06: qty 1

## 2013-09-06 MED ORDER — FENTANYL CITRATE 0.05 MG/ML IJ SOLN
INTRAMUSCULAR | Status: AC
  Filled 2013-09-06: qty 2

## 2013-09-06 MED ORDER — HYDROCODONE-ACETAMINOPHEN 7.5-325 MG PO TABS
1.0000 | ORAL_TABLET | ORAL | Status: DC
  Administered 2013-09-06: 1 via ORAL
  Administered 2013-09-07 (×3): 2 via ORAL
  Administered 2013-09-07 (×2): 1 via ORAL
  Administered 2013-09-07 – 2013-09-08 (×2): 2 via ORAL
  Administered 2013-09-08: 1 via ORAL
  Filled 2013-09-06: qty 2
  Filled 2013-09-06 (×3): qty 1
  Filled 2013-09-06: qty 2
  Filled 2013-09-06: qty 1
  Filled 2013-09-06 (×3): qty 2

## 2013-09-06 MED ORDER — TRANEXAMIC ACID 100 MG/ML IV SOLN
1000.0000 mg | Freq: Once | INTRAVENOUS | Status: AC
  Administered 2013-09-06: 1000 mg via INTRAVENOUS
  Filled 2013-09-06: qty 10

## 2013-09-06 MED ORDER — POTASSIUM CHLORIDE 2 MEQ/ML IV SOLN
100.0000 mL/h | INTRAVENOUS | Status: DC
  Administered 2013-09-06 – 2013-09-07 (×2): 100 mL/h via INTRAVENOUS
  Filled 2013-09-06 (×11): qty 1000

## 2013-09-06 MED ORDER — CELECOXIB 200 MG PO CAPS
200.0000 mg | ORAL_CAPSULE | Freq: Two times a day (BID) | ORAL | Status: DC
  Administered 2013-09-06 – 2013-09-08 (×4): 200 mg via ORAL
  Filled 2013-09-06 (×5): qty 1

## 2013-09-06 MED ORDER — AMLODIPINE BESYLATE 5 MG PO TABS
5.0000 mg | ORAL_TABLET | Freq: Every day | ORAL | Status: DC
  Administered 2013-09-06 – 2013-09-07 (×2): 5 mg via ORAL
  Filled 2013-09-06 (×3): qty 1

## 2013-09-06 MED ORDER — PROPOFOL 10 MG/ML IV BOLUS
INTRAVENOUS | Status: AC
  Filled 2013-09-06: qty 20

## 2013-09-06 MED ORDER — LACTATED RINGERS IV SOLN: INTRAVENOUS | Status: DC

## 2013-09-06 MED ORDER — CEFAZOLIN SODIUM-DEXTROSE 2-3 GM-% IV SOLR
INTRAVENOUS | Status: AC
  Filled 2013-09-06: qty 50

## 2013-09-06 MED ORDER — HYDROCHLOROTHIAZIDE 25 MG PO TABS
12.5000 mg | ORAL_TABLET | Freq: Every morning | ORAL | Status: DC
  Filled 2013-09-06: qty 0.5

## 2013-09-06 MED ORDER — BUDESONIDE-FORMOTEROL FUMARATE 160-4.5 MCG/ACT IN AERO
2.0000 | INHALATION_SPRAY | Freq: Two times a day (BID) | RESPIRATORY_TRACT | Status: DC
  Administered 2013-09-06 – 2013-09-08 (×4): 2 via RESPIRATORY_TRACT
  Filled 2013-09-06: qty 6

## 2013-09-06 MED ORDER — RIVAROXABAN 10 MG PO TABS
10.0000 mg | ORAL_TABLET | ORAL | Status: DC
  Administered 2013-09-07 – 2013-09-08 (×2): 10 mg via ORAL
  Filled 2013-09-06 (×2): qty 1

## 2013-09-06 MED ORDER — DIPHENHYDRAMINE HCL 25 MG PO CAPS: 25.0000 mg | ORAL_CAPSULE | Freq: Four times a day (QID) | ORAL | Status: DC | PRN

## 2013-09-06 MED ORDER — SODIUM CHLORIDE 0.9 % IR SOLN
Status: DC | PRN
  Administered 2013-09-06: 1000 mL

## 2013-09-06 MED ORDER — PROPOFOL INFUSION 10 MG/ML OPTIME
INTRAVENOUS | Status: DC | PRN
  Administered 2013-09-06: 75 ug/kg/min via INTRAVENOUS

## 2013-09-06 SURGICAL SUPPLY — 40 items
ADH SKN CLS APL DERMABOND .7 (GAUZE/BANDAGES/DRESSINGS) ×1
BAG SPEC THK2 15X12 ZIP CLS (MISCELLANEOUS)
BAG ZIPLOCK 12X15 (MISCELLANEOUS) IMPLANT
BLADE SAW SGTL 18X1.27X75 (BLADE) ×2 IMPLANT
BLADE SAW SGTL 18X1.27X75MM (BLADE) ×1
CAPT HIP PF MOP ×3 IMPLANT
DERMABOND ADVANCED (GAUZE/BANDAGES/DRESSINGS) ×2
DERMABOND ADVANCED .7 DNX12 (GAUZE/BANDAGES/DRESSINGS) ×1 IMPLANT
DRAPE C-ARM 42X120 X-RAY (DRAPES) ×3 IMPLANT
DRAPE STERI IOBAN 125X83 (DRAPES) ×3 IMPLANT
DRAPE U-SHAPE 47X51 STRL (DRAPES) ×9 IMPLANT
DRSG AQUACEL AG ADV 3.5X10 (GAUZE/BANDAGES/DRESSINGS) ×3 IMPLANT
DRSG TEGADERM 4X4.75 (GAUZE/BANDAGES/DRESSINGS) IMPLANT
DURAPREP 26ML APPLICATOR (WOUND CARE) ×3 IMPLANT
ELECT BLADE TIP CTD 4 INCH (ELECTRODE) ×3 IMPLANT
ELECT REM PT RETURN 9FT ADLT (ELECTROSURGICAL) ×3
ELECTRODE REM PT RTRN 9FT ADLT (ELECTROSURGICAL) ×1 IMPLANT
EVACUATOR 1/8 PVC DRAIN (DRAIN) IMPLANT
FACESHIELD LNG OPTICON STERILE (SAFETY) ×12 IMPLANT
GAUZE SPONGE 2X2 8PLY STRL LF (GAUZE/BANDAGES/DRESSINGS) IMPLANT
GLOVE BIOGEL PI IND STRL 7.5 (GLOVE) ×1 IMPLANT
GLOVE BIOGEL PI IND STRL 8 (GLOVE) ×1 IMPLANT
GLOVE BIOGEL PI INDICATOR 7.5 (GLOVE) ×2
GLOVE BIOGEL PI INDICATOR 8 (GLOVE) ×2
GLOVE ECLIPSE 8.0 STRL XLNG CF (GLOVE) ×3 IMPLANT
GLOVE ORTHO TXT STRL SZ7.5 (GLOVE) ×6 IMPLANT
GOWN SPEC L3 XXLG W/TWL (GOWN DISPOSABLE) ×3 IMPLANT
GOWN STRL REUS W/TWL LRG LVL3 (GOWN DISPOSABLE) ×3 IMPLANT
KIT BASIN OR (CUSTOM PROCEDURE TRAY) ×3 IMPLANT
PACK TOTAL JOINT (CUSTOM PROCEDURE TRAY) ×3 IMPLANT
PADDING CAST COTTON 6X4 STRL (CAST SUPPLIES) ×3 IMPLANT
SPONGE GAUZE 2X2 STER 10/PKG (GAUZE/BANDAGES/DRESSINGS)
SUT MNCRL AB 4-0 PS2 18 (SUTURE) ×3 IMPLANT
SUT VIC AB 1 CT1 36 (SUTURE) ×9 IMPLANT
SUT VIC AB 2-0 CT1 27 (SUTURE) ×6
SUT VIC AB 2-0 CT1 TAPERPNT 27 (SUTURE) ×2 IMPLANT
SUT VLOC 180 0 24IN GS25 (SUTURE) ×3 IMPLANT
TOWEL OR 17X26 10 PK STRL BLUE (TOWEL DISPOSABLE) ×3 IMPLANT
TOWEL OR NON WOVEN STRL DISP B (DISPOSABLE) IMPLANT
TRAY FOLEY CATH 14FRSI W/METER (CATHETERS) IMPLANT

## 2013-09-06 NOTE — Anesthesia Postprocedure Evaluation (Signed)
  Anesthesia Post-op Note  Patient: [name] [name]  Procedure(s) Performed: Procedure(s) (LRB): LEFT TOTAL HIP ARTHROPLASTY ANTERIOR APPROACH (Left)  Patient Location: PACU  Anesthesia Type: Spinal  Level of Consciousness: awake and alert   Airway and Oxygen Therapy: Patient Spontanous Breathing  Post-op Pain: mild  Post-op Assessment: Post-op Vital signs reviewed, Patient's Cardiovascular Status Stable, Respiratory Function Stable, Patent Airway and No signs of Nausea or vomiting  Last Vitals:  Filed Vitals:   09/06/13 1730  BP: 126/93  Pulse: 63  Temp:   Resp: 9    Post-op Vital Signs: stable   Complications: No apparent anesthesia complications

## 2013-09-06 NOTE — Interval H&P Note (Signed)
History and Physical Interval Note:  09/06/2013 1:50 PM  [name] [name]  has presented today for surgery, with the diagnosis of osteoarthritis of the left hip  The various methods of treatment have been discussed with the patient and family. After consideration of risks, benefits and other options for treatment, the patient has consented to  Procedure(s): LEFT TOTAL HIP ARTHROPLASTY ANTERIOR APPROACH (Left) as a surgical intervention .  The patient's history has been reviewed, patient examined, no change in status, stable for surgery.  I have reviewed the patient's chart and labs.  Questions were answered to the patient's satisfaction.     Mauri Pole

## 2013-09-06 NOTE — Transfer of Care (Signed)
Immediate Anesthesia Transfer of Care Note  Patient: [name] [name]  Procedure(s) Performed: Procedure(s): LEFT TOTAL HIP ARTHROPLASTY ANTERIOR APPROACH (Left)  Patient Location: PACU  Anesthesia Type:Spinal  Level of Consciousness: awake and alert   Airway & Oxygen Therapy: Patient Spontanous Breathing and Patient connected to face mask oxygen  Post-op Assessment: Report given to PACU RN and Post -op Vital signs reviewed and stable  Post vital signs: Reviewed and stable  Complications: No apparent anesthesia complications

## 2013-09-06 NOTE — Op Note (Signed)
NAME:  [name] [name]                ACCOUNT NO.: [id number]      MEDICAL RECORD NO.: [id number]      FACILITY:  [facility]      PHYSICIAN:  Paralee Cancel D  DATE OF BIRTH:  [birth year]     DATE OF PROCEDURE:  09/06/2013                                 OPERATIVE REPORT         PREOPERATIVE DIAGNOSIS: Left  hip osteoarthritis.      POSTOPERATIVE DIAGNOSIS:  Left hip osteoarthritis.  History of right total hip replacement     PROCEDURE:  Left total hip replacement through an anterior approach   utilizing DePuy THR system, component size 57mm pinnacle cup, a size 32+4 neutral   Altrex liner, a size 4 Hi Tri Lock stem with a 32+5 Articuleze metal head ball.      SURGEON:  Pietro Cassis. Alvan Dame, M.D.      ASSISTANT:  Danae Orleans, PA-C     ANESTHESIA:  Spinal.      SPECIMENS:  None.      COMPLICATIONS:  None.      BLOOD LOSS:  150cc.     DRAINS:  None.      INDICATION OF THE PROCEDURE:  [name] [name] is a 80 y.o. female who had   presented to office for evaluation of left hip pain.  Radiographs revealed   progressive degenerative changes with bone-on-bone   articulation to the  hip joint.  The patient had painful limited range of   motion significantly affecting their overall quality of life.  The patient was failing to    respond to conservative measures, and at this point was ready   to proceed with more definitive measures.  The patient has noted progressive   degenerative changes in his hip, progressive problems and dysfunction   with regarding the hip prior to surgery.  Consent was obtained for   benefit of pain relief.  Specific risk of infection, DVT, component   failure, dislocation, need for revision surgery, as well discussion of   the anterior versus posterior approach were reviewed.  Consent was   obtained for benefit of anterior pain relief through an anterior   approach.      PROCEDURE IN DETAIL:  The patient was brought to operative theater.   Once adequate anesthesia, preoperative antibiotics, 2gm Ancef administered.   The patient was positioned supine on the OSI Hanna table.  Once adequate   padding of boney process was carried out, we had predraped out the hip, and  used fluoroscopy to confirm orientation of the pelvis and position.      The left hip was then prepped and draped from proximal iliac crest to   mid thigh with shower curtain technique.      Time-out was performed identifying the patient, planned procedure, and   extremity.     An incision was then made 2 cm distal and lateral to the   anterior superior iliac spine extending over the orientation of the   tensor fascia lata muscle and sharp dissection was carried down to the   fascia of the muscle and protractor placed in the soft tissues.      The fascia was then incised.  The muscle belly was identified  and swept   laterally and retractor placed along the superior neck.  Following   cauterization of the circumflex vessels and removing some pericapsular   fat, a second cobra retractor was placed on the inferior neck.  A third   retractor was placed on the anterior acetabulum after elevating the   anterior rectus.  A L-capsulotomy was along the line of the   superior neck to the trochanteric fossa, then extended proximally and   distally.  Tag sutures were placed and the retractors were then placed   intracapsular.  We then identified the trochanteric fossa and   orientation of my neck cut, confirmed this radiographically   and then made a neck osteotomy with the femur on traction.  The femoral   head was removed without difficulty or complication.  Traction was let   off and retractors were placed posterior and anterior around the   acetabulum.      The labrum and foveal tissue were debrided.  I began reaming with a 56mm   reamer and reamed up to 62mm reamer with good bony bed preparation and a 50   cup was chosen.  The final 66mm Pinnacle cup was then  impacted under fluoroscopy  to confirm the depth of penetration and orientation with respect to   abduction.  A screw was placed followed by the hole eliminator.  The final   32+4 neutral Altrex liner was impacted with good visualized rim fit.  The cup was positioned anatomically within the acetabular portion of the pelvis.      At this point, the femur was rolled at 80 degrees.  Further capsule was   released off the inferior aspect of the femoral neck.  I then   released the superior capsule proximally.  The hook was placed laterally   along the femur and elevated manually and held in position with the bed   hook.  The leg was then extended and adducted with the leg rolled to 100   degrees of external rotation.  Once the proximal femur was fully   exposed, I used a box osteotome to set orientation.  I then began   broaching with the starting chili pepper broach and passed this by hand and then broached up to 4.  With the 4 broach in place I chose a high offset neck and did a trial reduction first with the +1.5 then the +5 head ball.  The offset was appropriate, leg lengths   appeared to be equal, confirmed radiographically.   Given these findings, I went ahead and dislocated the hip, repositioned all   retractors and positioned the right hip in the extended and abducted position.  The final 4 Hi Tri Lock stem was   chosen and it was impacted down to the level of neck cut.  Based on this   and the trial reduction, a 32+5 Articuleze metal head ball was chosen and   impacted onto a clean and dry trunnion, and the hip was reduced.  The   hip had been irrigated throughout the case again at this point.  I did   reapproximate the superior capsular leaflet to the anterior leaflet   using #1 Vicryl.  The fascia of the   tensor fascia lata muscle was then reapproximated using #1 Vicryl and #0 V-lock.  The   remaining wound was closed with 2-0 Vicryl and running 4-0 Monocryl.   The hip was cleaned,  dried, and dressed sterilely using Dermabond and  Aquacel dressing.  She was then brought   to recovery room in stable condition tolerating the procedure well.    Danae Orleans, PA-C was present for the entirety of the case involved from   preoperative positioning, perioperative retractor management, general   facilitation of the case, as well as primary wound closure as assistant.            Pietro Cassis Alvan Dame, M.D.        09/06/2013 4:23 PM

## 2013-09-06 NOTE — Anesthesia Preprocedure Evaluation (Addendum)
Anesthesia Evaluation  Patient identified by MRN, date of birth, ID band Patient awake    Reviewed: Allergy & Precautions, H&P , NPO status , Patient's Chart, lab work & pertinent test results  Airway Mallampati: II TM Distance: >3 FB Neck ROM: Full    Dental  (+) Teeth Intact and Dental Advisory Given   Pulmonary pneumonia -, COPD COPD inhaler, former smoker,  breath sounds clear to auscultation  Pulmonary exam normal       Cardiovascular hypertension, Pt. on medications negative cardio ROS  Rhythm:Regular Rate:Normal     Neuro/Psych Anxiety negative neurological ROS  negative psych ROS   GI/Hepatic negative GI ROS, Neg liver ROS,   Endo/Other  negative endocrine ROS  Renal/GU negative Renal ROS  negative genitourinary   Musculoskeletal negative musculoskeletal ROS (+)   Abdominal   Peds  Hematology negative hematology ROS (+) anemia ,   Anesthesia Other Findings   Reproductive/Obstetrics                        Anesthesia Physical Anesthesia Plan  ASA: II  Anesthesia Plan: Spinal   Post-op Pain Management:    Induction: Intravenous  Airway Management Planned: Simple Face Mask  Additional Equipment:   Intra-op Plan:   Post-operative Plan: Extubation in OR  Informed Consent: I have reviewed the patients History and Physical, chart, labs and discussed the procedure including the risks, benefits and alternatives for the proposed anesthesia with the patient or authorized representative who has indicated his/her understanding and acceptance.   Dental advisory given  Plan Discussed with: CRNA  Anesthesia Plan Comments: (Patient request SAB for hip replacement. )       Anesthesia Quick Evaluation

## 2013-09-06 NOTE — Anesthesia Procedure Notes (Signed)
Spinal  Patient location during procedure: OR Start time: 09/06/2013 3:10 PM End time: 09/06/2013 3:12 PM Staffing Anesthesiologist: Freddie Apley F Performed by: anesthesiologist  Preanesthetic Checklist Completed: patient identified, site marked, surgical consent, pre-op evaluation, timeout performed, IV checked, risks and benefits discussed and monitors and equipment checked Spinal Block Patient position: sitting Prep: Betadine Patient monitoring: heart rate, continuous pulse ox and blood pressure Approach: midline Location: L2-3 Injection technique: single-shot Needle Needle type: Spinocan  Needle gauge: 22 G Needle length: 9 cm Additional Notes Expiration date of kit checked and confirmed. Patient tolerated procedure well, without complications. Negative heme/paresthesia

## 2013-09-07 ENCOUNTER — Encounter (HOSPITAL_COMMUNITY): Payer: Self-pay | Admitting: Orthopedic Surgery

## 2013-09-07 DIAGNOSIS — E663 Overweight: Secondary | ICD-10-CM | POA: Diagnosis present

## 2013-09-07 DIAGNOSIS — D62 Acute posthemorrhagic anemia: Secondary | ICD-10-CM

## 2013-09-07 HISTORY — DX: Acute posthemorrhagic anemia: D62

## 2013-09-07 LAB — BASIC METABOLIC PANEL
BUN: 15 mg/dL (ref 6–23)
CHLORIDE: 94 meq/L — AB (ref 96–112)
CO2: 26 meq/L (ref 19–32)
CREATININE: 0.49 mg/dL — AB (ref 0.50–1.10)
Calcium: 8.2 mg/dL — ABNORMAL LOW (ref 8.4–10.5)
GFR calc Af Amer: >90 mL/min (ref >90)
GFR calc non Af Amer: 90 mL/min — ABNORMAL LOW (ref >90)
GLUCOSE: 158 mg/dL — AB (ref 70–99)
POTASSIUM: 5 meq/L (ref 3.7–5.3)
Sodium: 133 mEq/L — ABNORMAL LOW (ref 137–147)

## 2013-09-07 LAB — CBC
HEMATOCRIT: 33.8 % — AB (ref 36.0–46.0)
HEMOGLOBIN: 11.2 g/dL — AB (ref 12.0–15.0)
MCH: 32.9 pg (ref 26.0–34.0)
MCHC: 33.1 g/dL (ref 30.0–36.0)
MCV: 99.4 fL (ref 78.0–100.0)
Platelets: 198 10*3/uL (ref 150–400)
RBC: 3.4 MIL/uL — ABNORMAL LOW (ref 3.87–5.11)
RDW: 12.7 % (ref 11.5–15.5)
WBC: 6.4 10*3/uL (ref 4.0–10.5)

## 2013-09-07 MED ORDER — HYDROCHLOROTHIAZIDE 12.5 MG PO CAPS
12.5000 mg | ORAL_CAPSULE | Freq: Every day | ORAL | Status: DC
  Administered 2013-09-07 – 2013-09-08 (×2): 12.5 mg via ORAL
  Filled 2013-09-07 (×2): qty 1

## 2013-09-07 NOTE — Progress Notes (Signed)
Physical Therapy Treatment Patient Details Name: [name] [name] MRN: [id number] DOB: 1930/12/06 Today's Date: 09/07/2013 Time: 4650-3546 PT Time Calculation (min): 30 min  PT Assessment / Plan / Recommendation  History of Present Illness     PT Comments   Pt to/from bathroom and ambulating in hall.  Min c/o nausea and fatigue  Follow Up Recommendations  SNF     Does the patient have the potential to tolerate intense rehabilitation     Barriers to Discharge        Equipment Recommendations  None recommended by PT    Recommendations for Other Services OT consult  Frequency 7X/week   Progress towards PT Goals Progress towards PT goals: Progressing toward goals  Plan Current plan remains appropriate    Precautions / Restrictions Precautions Precautions: Fall Restrictions Weight Bearing Restrictions: No Other Position/Activity Restrictions: WBAT   Pertinent Vitals/Pain 3/10; premed, ice pack provided    Mobility  Bed Mobility Overal bed mobility: Needs Assistance Bed Mobility: Sit to Supine Sit to supine: Mod assist General bed mobility comments: cues for sequence and use of R LE to self assist.  Physical assist to manage L LE and to bring fwd to EOB Transfers Overall transfer level: Needs assistance Equipment used: Rolling walker (2 wheeled) Transfers: Sit to/from Stand Sit to Stand: Mod assist General transfer comment: cues for LE management and use of UEs to self assist Ambulation/Gait Ambulation/Gait assistance: Mod assist;Min assist Ambulation Distance (Feet): 54 Feet (and 20) Assistive device: Rolling walker (2 wheeled) Gait Pattern/deviations: Step-to pattern;Decreased step length - right;Decreased step length - left;Shuffle;Antalgic;Trunk flexed Gait velocity: decr General Gait Details: Cues for posture, sequence and position from RW; ltd by onset nausea    Exercises     PT Diagnosis:    PT Problem List:   PT Treatment Interventions:     PT Goals  (current goals can now be found in the care plan section) Acute Rehab PT Goals Patient Stated Goal: Rehab at [facility] and resume previous lifestyle with decreased pain PT Goal Formulation: With patient Time For Goal Achievement: 09/14/13 Potential to Achieve Goals: Good  Visit Information  Last PT Received On: 09/07/13 Assistance Needed: +1    Subjective Data  Subjective: I'm doing much better than this morning Patient Stated Goal: Rehab at [facility] and resume previous lifestyle with decreased pain   Cognition  Cognition Arousal/Alertness: Awake/alert Behavior During Therapy: WFL for tasks assessed/performed Overall Cognitive Status: Within Functional Limits for tasks assessed    Balance     End of Session PT - End of Session Equipment Utilized During Treatment: Gait belt Activity Tolerance: Patient tolerated treatment well Patient left: in bed;with call bell/phone within reach;with family/visitor present Nurse Communication: Mobility status   GP     [name] [name] 09/07/2013, 3:10 PM

## 2013-09-07 NOTE — Progress Notes (Signed)
Clinical Social Work Department BRIEF PSYCHOSOCIAL ASSESSMENT 09/07/2013  Patient:  [name] [name], [name] [name]     Account Number:  [id number]     Admit date:  09/06/2013  Clinical Social Worker:  Lacie Scotts  Date/Time:  09/07/2013 08:10 AM  Referred by:  Physician  Date Referred:  09/07/2013 Referred for  SNF Placement   Other Referral:   Interview type:  Patient Other interview type:    PSYCHOSOCIAL DATA Living Status:  FACILITY Admitted from facility:  [facility] Level of care:  Independent Living Primary support name:  Gerri Spore Primary support relationship to patient:  CHILD, ADULT Degree of support available:   supportive    CURRENT CONCERNS Current Concerns  Post-Acute Placement   Other Concerns:    SOCIAL WORK ASSESSMENT / PLAN Pt is an 80 yr old female admitted from [facility]. CSW met with pt to assist with d/c planning. Pt has made prior arrangements to have ST Rehab at [facility] ( skilled Unit ) following hospital d/c. CSW will confirm d/c plan with [facility] this am. CSW will continue to follow to assist with d/c planning to SNF.   Assessment/plan status:  Psychosocial Support/Ongoing Assessment of Needs Other assessment/ plan:   Information/referral to community resources:   Insurance coverage for ambulance transport reviewed.    PATIENT'S/FAMILY'S RESPONSE TO PLAN OF CARE: " I had a tour of the rehab unit at [facility]. It's wonderful. "  Pt is looking forward to having rehab at [facility].   Werner Lean LCSW [phone]

## 2013-09-07 NOTE — Progress Notes (Signed)
OT Cancellation Note  Patient Details Name: NEKAYLA HEIDER MRN: [id number] DOB: 16-Nov-1930   Cancelled Treatment:    Reason Eval/Treat Not Completed: Other (comment)  Pt limited by nausea and dizziness with PT.  Will check back in am.  Lailee Hoelzel 09/07/2013, 12:38 PM Lesle Chris, OTR/L [phone] 09/07/2013

## 2013-09-07 NOTE — Progress Notes (Signed)
Utilization review completed.  

## 2013-09-07 NOTE — Discharge Instructions (Signed)
Information on my medicine - XARELTO® (Rivaroxaban) ° °This medication education was reviewed with me or my healthcare representative as part of my discharge preparation.  The pharmacist that spoke with me during my hospital stay was:  Emalynn Clewis E, RPH ° °Why was Xarelto® prescribed for you? °Xarelto® was prescribed for you to reduce the risk of blood clots forming after orthopedic surgery. The medical term for these abnormal blood clots is venous thromboembolism (VTE). ° °What do you need to know about xarelto® ? °Take your Xarelto® ONCE DAILY at the same time every day. °You may take it either with or without food. ° °If you have difficulty swallowing the tablet whole, you may crush it and mix in applesauce just prior to taking your dose. ° °Take Xarelto® exactly as prescribed by your doctor and DO NOT stop taking Xarelto® without talking to the doctor who prescribed the medication.  Stopping without other VTE prevention medication to take the place of Xarelto® may increase your risk of developing a clot. ° °After discharge, you should have regular check-up appointments with your healthcare provider that is prescribing your Xarelto®.   ° °What do you do if you miss a dose? °If you miss a dose, take it as soon as you remember on the same day then continue your regularly scheduled once daily regimen the next day. Do not take two doses of Xarelto® on the same day.  ° °Important Safety Information °A possible side effect of Xarelto® is bleeding. You should call your healthcare provider right away if you experience any of the following: °? Bleeding from an injury or your nose that does not stop. °? Unusual colored urine (red or dark brown) or unusual colored stools (red or black). °? Unusual bruising for unknown reasons. °? A serious fall or if you hit your head (even if there is no bleeding). ° °Some medicines may interact with Xarelto® and might increase your risk of bleeding while on Xarelto®. To help avoid  this, consult your healthcare provider or pharmacist prior to using any new prescription or non-prescription medications, including herbals, vitamins, non-steroidal anti-inflammatory drugs (NSAIDs) and supplements. ° °This website has more information on Xarelto®: www.xarelto.com. ° ° ° °

## 2013-09-07 NOTE — Evaluation (Signed)
Physical Therapy Evaluation Patient Details Name: [name] [name] MRN: [id number] DOB: 09/13/1930 Today's Date: 09/07/2013 Time: 0867-6195 PT Time Calculation (min): 35 min  PT Assessment / Plan / Recommendation History of Present Illness     Clinical Impression  Pt s/p L THR presents with decreased L LE strength/ROM and post op pain and nausea limiting functional mobility.  Pt would benefit from follow up rehab at [facility] to maximize IND and safety prior to return to IND living apt.     PT Assessment  Patient needs continued PT services    Follow Up Recommendations  SNF    Does the patient have the potential to tolerate intense rehabilitation      Barriers to Discharge        Equipment Recommendations  None recommended by PT    Recommendations for Other Services OT consult   Frequency 7X/week    Precautions / Restrictions Precautions Precautions: Fall Precaution Comments: pt nauseaous and dizzy on eval Restrictions Weight Bearing Restrictions: No Other Position/Activity Restrictions: WBAT   Pertinent Vitals/Pain 3/10; premed, ice pack provided      Mobility  Bed Mobility Overal bed mobility: Needs Assistance Bed Mobility: Supine to Sit Supine to sit: Mod assist General bed mobility comments: cues for sequence and use of R LE to self assist.  Physical assist to manage L LE and to bring fwd to EOB Transfers Overall transfer level: Needs assistance Equipment used: Rolling walker (2 wheeled) Transfers: Sit to/from Stand Sit to Stand: +2 physical assistance;Mod assist General transfer comment: cues for LE management and use of UEs to self assist Ambulation/Gait Ambulation/Gait assistance: Mod assist;+2 physical assistance Ambulation Distance (Feet): 8 Feet Assistive device: Rolling walker (2 wheeled) Gait Pattern/deviations: Step-to pattern;Shuffle;Decreased step length - right;Decreased step length - left;Antalgic Gait velocity: decr General Gait Details: Cues  for posture, sequence and position from RW; ltd by onset nausea    Exercises Total Joint Exercises Ankle Circles/Pumps: AROM;15 reps;Both;Supine Quad Sets: AROM;Both;10 reps;Supine Heel Slides: AAROM;Left;15 reps;Supine Hip ABduction/ADduction: AAROM;Left;15 reps;Supine   PT Diagnosis: Difficulty walking  PT Problem List: Decreased strength;Decreased range of motion;Decreased activity tolerance;Decreased mobility;Decreased knowledge of use of DME;Pain PT Treatment Interventions: DME instruction;Gait training;Stair training;Functional mobility training;Therapeutic activities;Therapeutic exercise;Patient/family education     PT Goals(Current goals can be found in the care plan section) Acute Rehab PT Goals Patient Stated Goal: Rehab at [facility] and resume previous lifestyle with decreased pain PT Goal Formulation: With patient Time For Goal Achievement: 09/14/13 Potential to Achieve Goals: Good  Visit Information  Last PT Received On: 09/07/13 Assistance Needed: +2 (pt nauseous and dizzy)       Prior [place] expects to be discharged to:: Other (Comment) Additional Comments: Rehab at [facility] where pt is a resident Prior Function Level of Independence: Independent with assistive device(s) Communication Communication: No difficulties    Cognition  Cognition Arousal/Alertness: Awake/alert Behavior During Therapy: WFL for tasks assessed/performed Overall Cognitive Status: Within Functional Limits for tasks assessed    Extremity/Trunk Assessment Upper Extremity Assessment Upper Extremity Assessment: Overall WFL for tasks assessed Lower Extremity Assessment Lower Extremity Assessment: LLE deficits/detail LLE Deficits / Details: L hip strength 2+/5 with AAROM at hip to 90 flex and 20 abd   Balance    End of Session PT - End of Session Equipment Utilized During Treatment: Gait belt Activity Tolerance: Patient tolerated treatment well;Other  (comment) (nausea) Patient left: in chair;with call bell/phone within reach;with nursing/sitter in room Nurse Communication: Mobility status  GP  Chiante Peden 09/07/2013, 12:07 PM

## 2013-09-07 NOTE — Progress Notes (Signed)
   Subjective: 1 Day Post-Op Procedure(s) (LRB): LEFT TOTAL HIP ARTHROPLASTY ANTERIOR APPROACH (Left)   Patient reports pain as mild, pain controlled. No events throughout the night.  Objective:   VITALS:   Filed Vitals:   09/07/13  BP: 117/75  Pulse: 66  Temp: 98.4 F (36.9 C)   Resp: 18    Neurovascular intact Dorsiflexion/Plantar flexion intact Incision: dressing C/D/I No cellulitis present Compartment soft  LABS  Recent Labs  09/07/13 0425  HGB 11.2*  HCT 33.8*  WBC 6.4  PLT 198     Recent Labs  09/07/13 0425  NA 133*  K 5.0  BUN 15  CREATININE 0.49*  GLUCOSE 158*     Assessment/Plan: 1 Day Post-Op Procedure(s) (LRB): LEFT TOTAL HIP ARTHROPLASTY ANTERIOR APPROACH (Left) Foley cath d/c'ed Advance diet Up with therapy D/C IV fluids Discharge to SNF eventually, when ready  Expected ABLA  Treated with iron and will observe  Overweight (BMI 25-29.9) Estimated body mass index is 28.58 kg/(m^2) as calculated from the following:   Height as of this encounter: 5\' 6"  (1.676 m).   Weight as of this encounter: 80.287 kg (177 lb). Patient also counseled that weight may inhibit the healing process Patient counseled that losing weight will help with future health issues      West Pugh. Kamaiya Antilla   PAC  09/07/2013, 9:06 AM

## 2013-09-08 LAB — BASIC METABOLIC PANEL
BUN: 16 mg/dL (ref 6–23)
CO2: 27 mEq/L (ref 19–32)
CREATININE: 0.51 mg/dL (ref 0.50–1.10)
Calcium: 8.3 mg/dL — ABNORMAL LOW (ref 8.4–10.5)
Chloride: 97 mEq/L (ref 96–112)
GFR, EST NON AFRICAN AMERICAN: 88 mL/min — AB (ref >90)
GLUCOSE: 148 mg/dL — AB (ref 70–99)
Potassium: 4 mEq/L (ref 3.7–5.3)
Sodium: 134 mEq/L — ABNORMAL LOW (ref 137–147)

## 2013-09-08 LAB — CBC
HEMATOCRIT: 31.2 % — AB (ref 36.0–46.0)
HEMOGLOBIN: 10.2 g/dL — AB (ref 12.0–15.0)
MCH: 32.8 pg (ref 26.0–34.0)
MCHC: 32.7 g/dL (ref 30.0–36.0)
MCV: 100.3 fL — AB (ref 78.0–100.0)
Platelets: 191 10*3/uL (ref 150–400)
RBC: 3.11 MIL/uL — ABNORMAL LOW (ref 3.87–5.11)
RDW: 12.8 % (ref 11.5–15.5)
WBC: 8.6 10*3/uL (ref 4.0–10.5)

## 2013-09-08 MED ORDER — RIVAROXABAN 10 MG PO TABS: 10.0000 mg | ORAL_TABLET | ORAL | Status: DC

## 2013-09-08 MED ORDER — HYDROCODONE-ACETAMINOPHEN 7.5-325 MG PO TABS: 1.0000 | ORAL_TABLET | ORAL | Status: DC | PRN

## 2013-09-08 MED ORDER — TIZANIDINE HCL 4 MG PO CAPS: 4.0000 mg | ORAL_CAPSULE | Freq: Three times a day (TID) | ORAL | Status: DC | PRN

## 2013-09-08 MED ORDER — DSS 100 MG PO CAPS: 100.0000 mg | ORAL_CAPSULE | Freq: Two times a day (BID) | ORAL | Status: DC

## 2013-09-08 MED ORDER — POLYETHYLENE GLYCOL 3350 17 G PO PACK: 17.0000 g | PACK | Freq: Two times a day (BID) | ORAL | Status: DC

## 2013-09-08 MED ORDER — FERROUS SULFATE 325 (65 FE) MG PO TABS: 325.0000 mg | ORAL_TABLET | Freq: Three times a day (TID) | ORAL | Status: DC

## 2013-09-08 MED ORDER — ALPRAZOLAM 0.25 MG PO TABS: 0.2500 mg | ORAL_TABLET | Freq: Four times a day (QID) | ORAL | Status: DC | PRN

## 2013-09-08 NOTE — Progress Notes (Signed)
Pt return to [facility] today for rehab via [facility] transport. Pt was in agreement with d/c plan.  Werner Lean LCSW [phone]

## 2013-09-08 NOTE — Evaluation (Signed)
Occupational Therapy Evaluation Patient Details Name: [name] [name] MRN: [id number] DOB: [birth year] Today's Date: 09/08/2013 Time: 2202-5427 OT Time Calculation (min): 20 min  OT Assessment / Plan / Recommendation History of present illness Pt is s/p L direct anterior THA   Clinical Impression   Pt doing well and planning SNF. Will benefit from continued OT services to improve ADL independence for next venue.    OT Assessment  Patient needs continued OT Services    Follow Up Recommendations  SNF;Supervision/Assistance - 24 hour    Barriers to Discharge      Equipment Recommendations  None recommended by OT    Recommendations for Other Services    Frequency  Min 2X/week    Precautions / Restrictions Precautions Precautions: Fall Restrictions Weight Bearing Restrictions: No Other Position/Activity Restrictions: WBAT   Pertinent Vitals/Pain 129/84 sitting after session;  0/10 pain     ADL  Eating/Feeding: Simulated;Independent Where Assessed - Eating/Feeding: Chair Grooming: Performed;Teeth care;Min guard Where Assessed - Grooming: Unsupported standing Upper Body Bathing: Simulated;Chest;Right arm;Left arm;Abdomen;Set up Where Assessed - Upper Body Bathing: Unsupported sitting Lower Body Bathing: Simulated;Minimal assistance Where Assessed - Lower Body Bathing: Supported sit to stand Upper Body Dressing: Simulated;Set up Where Assessed - Upper Body Dressing: Unsupported sitting Lower Body Dressing: Simulated;Minimal assistance Where Assessed - Lower Body Dressing: Supported sit to stand Toilet Transfer: Performed;Minimal assistance;Min guard Science writer: Raised toilet seat with arms (or 3-in-1 over toilet) Toileting - Clothing Manipulation and Hygiene: Simulated;Min guard Where Assessed - Toileting Clothing Manipulation and Hygiene: Sit to stand from 3-in-1 or toilet Equipment Used: Rolling walker ADL Comments: Pt has used AE in the past and discussed  option of using AE short term but the goal to reach down to feet. Discussed sequence of LB dressing also. Pt motivated and doing well. BP 129/84 sitting in chair after session.    OT Diagnosis: Generalized weakness  OT Problem List: Decreased strength;Decreased knowledge of use of DME or AE OT Treatment Interventions: Self-care/ADL training;DME and/or AE instruction;Therapeutic activities;Patient/family education   OT Goals(Current goals can be found in the care plan section) Acute Rehab OT Goals Patient Stated Goal: Rehab at [facility] and resume previous lifestyle with decreased pain OT Goal Formulation: With patient Time For Goal Achievement: 09/15/13 Potential to Achieve Goals: Good  Visit Information  Last OT Received On: 09/08/13 Assistance Needed: +1 History of Present Illness: Pt is s/p L direct anterior THA       Prior Functioning     Home Living Additional Comments: Rehab at [facility] where pt is a resident Prior Function Level of Independence: Independent with assistive device(s) Communication Communication: No difficulties         Vision/Perception     Cognition  Cognition Arousal/Alertness: Awake/alert Behavior During Therapy: WFL for tasks assessed/performed Overall Cognitive Status: Within Functional Limits for tasks assessed    Extremity/Trunk Assessment Upper Extremity Assessment Upper Extremity Assessment: Overall WFL for tasks assessed     Mobility Transfers Overall transfer level: Needs assistance Equipment used: Rolling walker (2 wheeled) Transfers: Sit to/from Stand Sit to Stand: Min guard General transfer comment: verbal cues for hand placement.     Exercise     Balance     End of Session OT - End of Session Equipment Utilized During Treatment: Rolling walker Activity Tolerance: Patient tolerated treatment well Patient left: in chair;with call bell/phone within reach  GO     Jules Schick [phone] 09/08/2013, 9:33  AM

## 2013-09-08 NOTE — Progress Notes (Signed)
Discharge summary sent to payer through [facility]  

## 2013-09-08 NOTE — Discharge Summary (Signed)
Physician Discharge Summary  Patient ID: DEBBRAH SAMPEDRO MRN: [id number] DOB/AGE: 1930-08-19 80 y.o.  Admit date: 09/06/2013 Discharge date:  09/08/2013  Procedures:  Procedure(s) (LRB): LEFT TOTAL HIP ARTHROPLASTY ANTERIOR APPROACH (Left)  Attending Physician:  Dr. Paralee Cancel   Admission Diagnoses:   Left hip OA / pain  Discharge Diagnoses:  Principal Problem:   S/P left THA, AA Active Problems:   Expected blood loss anemia   Overweight (BMI 25.0-29.9)  Past Medical History  Diagnosis Date  . COPD (chronic obstructive pulmonary disease)     pft's 09/15/2005, FEV1 1.69 (76%) with ration67 and better x 14% p B2, DLC 95%  . Hypertension   . Bronchitis, chronic   . Celiac disease   . Anxiety   . Pneumonia     hx of x3  . Arthritis     severe  . Anemia     "occasional"    HPI: [name] [name], 80 y.o. female, has a history of pain and functional disability in the left hip(s) due to arthritis and patient has failed non-surgical conservative treatments for greater than 12 weeks to include NSAID's and/or analgesics and activity modification. Onset of symptoms was gradual starting years ago with gradually worsening course since that time.The patient noted no past surgery on the left hip(s). Patient currently rates pain in the left hip at 8 out of 10 with activity. Patient has night pain, worsening of pain with activity and weight bearing, trendelenberg gait, pain that interfers with activities of daily living and pain with passive range of motion. Patient has evidence of periarticular osteophytes and joint space narrowing by imaging studies. This condition presents safety issues increasing the risk of falls. There is no current active infection. Risks, benefits and expectations were discussed with the patient. Risks including but not limited to the risk of anesthesia, blood clots, nerve damage, blood vessel damage, failure of the prosthesis, infection and up to and including death. Patient  understand the risks, benefits and expectations and wishes to proceed with surgery.   PCP: Osborne Casco, MD   Discharged Condition: good  Hospital Course:  Patient underwent the above stated procedure on 09/06/2013. Patient tolerated the procedure well and brought to the recovery room in good condition and subsequently to the floor.  POD #1 BP: 117/75 ; Pulse: 66 ; Temp: 98.4 F (36.9 C) ; Resp: 18  Patient reports pain as mild, pain controlled. No events throughout the night. Neurovascular intact, dorsiflexion/plantar flexion intact, incision: dressing C/D/I, no cellulitis present and compartment soft.   LABS  Basename    HGB  11.2  HCT  33.8   POD #2  BP: 148/88 ; Pulse: 72 ; Temp: 97.7 F (36.5 C) ; Resp: 20  Patient reports pain as mild, pain well controlled. No events throughout the night. Plan for discharged to SNF, when ready. Neurovascular intact, dorsiflexion/plantar flexion intact, incision: dressing C/D/I, no cellulitis present and compartment soft.   LABS  Basename    HGB  10.2  HCT  31.2    Discharge Exam: General appearance: alert, cooperative and no distress Extremities: Homans sign is negative, no sign of DVT, no edema, redness or tenderness in the calves or thighs and no ulcers, gangrene or trophic changes  Disposition:  Rehabilitation center with follow up in 2 weeks   Follow-up Information   Follow up with Mauri Pole, MD. Schedule an appointment as soon as possible for a visit in 2 weeks.   Specialty:  Orthopedic Surgery  Contact information:   [address] [place] [address] [place] [address] [phone]       Discharge Orders   Future Orders Complete By Expires   Call MD / Call 911  As directed    Comments:     If you experience chest pain or shortness of breath, CALL 911 and be transported to the hospital emergency room.  If you develope a fever above 101 F, pus (white drainage) or increased drainage or redness at the wound, or  calf pain, call your surgeon's office.   Change dressing  As directed    Comments:     Maintain surgical dressing for 10-14 days, or until follow up in the clinic.   Constipation Prevention  As directed    Comments:     Drink plenty of fluids.  Prune juice may be helpful.  You may use a stool softener, such as Colace (over the counter) 100 mg twice a day.  Use MiraLax (over the counter) for constipation as needed.   Diet - low sodium heart healthy  As directed    Discharge instructions  As directed    Comments:     Maintain surgical dressing for 10-14 days, or until follow up in the clinic. Follow up in 2 weeks at [facility]. Call with any questions or concerns.   Increase activity slowly as tolerated  As directed    TED hose  As directed    Comments:     Use stockings (TED hose) for 2 weeks on both leg(s).  You may remove them at night for sleeping.   Weight bearing as tolerated  As directed    Questions:     Laterality:     Extremity:          Medication List    STOP taking these medications       acetaminophen 650 MG CR tablet  Commonly known as:  TYLENOL     diphenhydramine-acetaminophen 25-500 MG Tabs  Commonly known as:  TYLENOL PM     HYDROcodone-acetaminophen 5-325 MG per tablet  Commonly known as:  NORCO/VICODIN  Replaced by:  HYDROcodone-acetaminophen 7.5-325 MG per tablet      TAKE these medications       ALPRAZolam 0.25 MG tablet  Commonly known as:  XANAX  Take 1 tablet (0.25 mg total) by mouth every 6 (six) hours as needed for anxiety or sleep.     amLODipine 5 MG tablet  Commonly known as:  NORVASC  Take 5 mg by mouth at bedtime.     BIOTIN PO  Take 1,000 mcg by mouth daily.     bisacodyl 5 MG EC tablet  Commonly known as:  DULCOLAX  Take 5 mg by mouth daily as needed for moderate constipation.     budesonide-formoterol 160-4.5 MCG/ACT inhaler  Commonly known as:  SYMBICORT  Inhale 2 puffs into the lungs 2 (two) times daily.      DSS 100 MG Caps  Take 100 mg by mouth 2 (two) times daily.     ferrous sulfate 325 (65 FE) MG tablet  Take 1 tablet (325 mg total) by mouth 3 (three) times daily after meals.     glucosamine-chondroitin 500-400 MG tablet  Take 1 tablet by mouth 2 (two) times daily.     hydrochlorothiazide 12.5 MG tablet  Commonly known as:  HYDRODIURIL  Take 12.5 mg by mouth every morning.     HYDROcodone-acetaminophen 7.5-325 MG per tablet  Commonly known as:  NORCO  Take 1-2 tablets by mouth every 4 (four) hours as needed for moderate pain.     losartan 100 MG tablet  Commonly known as:  COZAAR  Take 100 mg by mouth every evening.     polyethylene glycol packet  Commonly known as:  MIRALAX / GLYCOLAX  Take 17 g by mouth 2 (two) times daily.     rivaroxaban 10 MG Tabs tablet  Commonly known as:  XARELTO  Take 1 tablet (10 mg total) by mouth daily.     tiZANidine 4 MG capsule  Commonly known as:  ZANAFLEX  Take 1 capsule (4 mg total) by mouth 3 (three) times daily as needed for muscle spasms.     Vitamin D3 1000 UNITS Caps  Take 1 tablet by mouth daily.         Signed: West Pugh. Teofil Maniaci   PAC  09/08/2013, 9:41 AM

## 2013-09-08 NOTE — Progress Notes (Signed)
   Subjective: 2 Days Post-Op Procedure(s) (LRB): LEFT TOTAL HIP ARTHROPLASTY ANTERIOR APPROACH (Left)   Patient reports pain as mild, pain well controlled. No events throughout the night. Plan for discharged to SNF, when ready.  Objective:   VITALS:   Filed Vitals:   09/08/13 0550  BP: 148/88  Pulse: 72  Temp: 97.7 F (36.5 C)  Resp: 20    Neurovascular intact Dorsiflexion/Plantar flexion intact Incision: dressing C/D/I No cellulitis present Compartment soft  LABS  Recent Labs  09/07/13 0425 09/08/13 0433  HGB 11.2* 10.2*  HCT 33.8* 31.2*  WBC 6.4 8.6  PLT 198 191     Recent Labs  09/07/13 0425 09/08/13 0433  NA 133* 134*  K 5.0 4.0  BUN 15 16  CREATININE 0.49* 0.51  GLUCOSE 158* 148*     Assessment/Plan: 2 Days Post-Op Procedure(s) (LRB): LEFT TOTAL HIP ARTHROPLASTY ANTERIOR APPROACH (Left) Up with therapy Discharge to Rehab Follow up in 2 weeks at [facility]. Follow up with OLIN,Khiley Lieser D in 2 weeks.  Contact information:  [facility] [address], [place] [phone]    Expected ABLA  Treated with iron and will observe   Overweight (BMI 25-29.9)  Estimated body mass index is 28.58 kg/(m^2) as calculated from the following:      Height as of this encounter: 5\' 6"  (1.676 m).      Weight as of this encounter: 80.287 kg (177 lb).  Patient also counseled that weight may inhibit the healing process  Patient counseled that losing weight will help with future health issues        [name] [name]. [name] [name]   PAC  09/08/2013, 9:08 AM

## 2013-09-08 NOTE — Progress Notes (Signed)
Pt transported to [facility] via [place] and all paperwork sent with pt/[facility].

## 2013-09-08 NOTE — Progress Notes (Signed)
Physical Therapy Treatment Patient Details Name: [name] [name] MRN: [id number] DOB: 27-Jun-1931 Today's Date: 09/08/2013 Time: 5051-8335 PT Time Calculation (min): 23 min  PT Assessment / Plan / Recommendation  History of Present Illness Pt is s/p L direct anterior THA   PT Comments     Follow Up Recommendations  SNF     Does the patient have the potential to tolerate intense rehabilitation     Barriers to Discharge        Equipment Recommendations  None recommended by PT    Recommendations for Other Services OT consult  Frequency 7X/week   Progress towards PT Goals Progress towards PT goals: Progressing toward goals  Plan Current plan remains appropriate    Precautions / Restrictions Precautions Precautions: Fall Restrictions Weight Bearing Restrictions: No Other Position/Activity Restrictions: WBAT   Pertinent Vitals/Pain 3/10; premed    Mobility  Bed Mobility Overal bed mobility: Needs Assistance Bed Mobility: Supine to Sit Supine to sit: Min assist General bed mobility comments: cues for sequence and use of R LE to self assist.  Physical assist to manage L LE and to bring fwd to EOB Transfers Overall transfer level: Needs assistance Equipment used: Rolling walker (2 wheeled) Transfers: Sit to/from Stand Sit to Stand: Min guard General transfer comment: verbal cues for hand placement. Ambulation/Gait Ambulation/Gait assistance: Min assist Ambulation Distance (Feet): 75 Feet (twice) Assistive device: Rolling walker (2 wheeled) Gait Pattern/deviations: Step-to pattern;Step-through pattern;Trunk flexed;Antalgic;Shuffle General Gait Details: Cues for posture, sequence and position from RW; ltd by onset nausea    Exercises Total Joint Exercises Ankle Circles/Pumps: AROM;15 reps;Both;Supine Quad Sets: AROM;Both;10 reps;Supine Heel Slides: AAROM;Left;Supine;20 reps Hip ABduction/ADduction: AAROM;Left;Supine;20 reps   PT Diagnosis:    PT Problem List:   PT  Treatment Interventions:     PT Goals (current goals can now be found in the care plan section) Acute Rehab PT Goals Patient Stated Goal: Rehab at [facility] and resume previous lifestyle with decreased pain PT Goal Formulation: With patient Time For Goal Achievement: 09/14/13 Potential to Achieve Goals: Good  Visit Information  Last PT Received On: 09/08/13 Assistance Needed: +1 History of Present Illness: Pt is s/p L direct anterior THA    Subjective Data  Subjective: I'm doing much better than yesterday Patient Stated Goal: Rehab at [facility] and resume previous lifestyle with decreased pain   Cognition  Cognition Arousal/Alertness: Awake/alert Behavior During Therapy: WFL for tasks assessed/performed Overall Cognitive Status: Within Functional Limits for tasks assessed    Balance     End of Session PT - End of Session Equipment Utilized During Treatment: Gait belt Activity Tolerance: Patient tolerated treatment well Patient left: in chair;with call bell/phone within reach;with nursing/sitter in room Nurse Communication: Mobility status   GP     [name] [name] 09/08/2013, 12:23 PM

## 2013-09-08 NOTE — Care Management Note (Signed)
    Page 1 of 1   09/08/2013     4:39:29 PM   CARE MANAGEMENT NOTE 09/08/2013  Patient:  [name] [name], [name] [name]   Account Number:  [id number]  Date Initiated:  09/08/2013  Documentation initiated by:  Sherrin Daisy  Subjective/Objective Assessment:   dx left hip replacemnt-anterior approach     Action/Plan:   Plans are for SNF rehab   Anticipated DC Date:  09/08/2013   Anticipated DC Plan:  [place]  In-house referral  Clinical Social Worker      DC Planning Services  CM consult      Choice offered to / List presented to:             Status of service:  Completed, signed off Medicare Important Message given?  NA - LOS <3 / Initial given by admissions (If response is "NO", the following Medicare IM given date fields will be blank) Date Medicare IM given:   Date Additional Medicare IM given:    Discharge Disposition:  [place]  Per UR Regulation:    If discussed at Long Length of Stay Meetings, dates discussed:    Comments:

## 2013-09-09 ENCOUNTER — Encounter: Payer: Self-pay | Admitting: Geriatric Medicine

## 2013-09-09 ENCOUNTER — Non-Acute Institutional Stay (SKILLED_NURSING_FACILITY): Payer: Medicare Other | Admitting: Geriatric Medicine

## 2013-09-09 DIAGNOSIS — I10 Essential (primary) hypertension: Secondary | ICD-10-CM

## 2013-09-09 DIAGNOSIS — D62 Acute posthemorrhagic anemia: Secondary | ICD-10-CM

## 2013-09-09 DIAGNOSIS — Z96649 Presence of unspecified artificial hip joint: Secondary | ICD-10-CM

## 2013-09-09 DIAGNOSIS — J449 Chronic obstructive pulmonary disease, unspecified: Secondary | ICD-10-CM

## 2013-09-09 NOTE — Assessment & Plan Note (Signed)
Stable respiratory status 

## 2013-09-09 NOTE — Assessment & Plan Note (Signed)
Mild drop in hemoglobin after surgery, repeat CBC 09/13/2013

## 2013-09-09 NOTE — Assessment & Plan Note (Signed)
Pressure range satisfactory, continue current medications.

## 2013-09-09 NOTE — Assessment & Plan Note (Addendum)
Status post hip replacement earlier this week, no significant surgical site pain. Incision remains covered with clean dry surgical dressing. No surrounding erythema or edema. Patient is ambulating short distances w/ walker, PT/OT have ben initiated. Patient expects to be discharged to her independent living home within a week

## 2013-09-09 NOTE — Progress Notes (Signed)
Patient ID: [name] [name], female   DOB: 07/23/1931, 80 y.o.   MRN: [id number]   [facility] SNF [phone])  Code Status: Full Code Contact Information   Name Relation Home Work Stamps [phone]     [facility] Daughter [phone]  [phone]   Demetrios Loll [phone]     Patton Salles [phone]    [place] Relative [phone]         Chief Complaint  Patient presents with  . Hospitalization Follow-up    Total hip arthroplasty, left    HPI: This is a 80 y.o. female resident of [place], Independent Living  section. This patient has been followed by Dr. Alvan Dame for pain and functional disability of left hip due to osteoarthritis. As she did not respond satisfactorily to conservative treatments, Dr. Alvan Dame recommend proceeding with a left total hip arthroplasty, anterior approach. Patient underwent this procedure on 09/06/2013, surgical procedure and immediate postoperative period were uneventful. Pain was well controlled, she participated with PT, able to ambulate at time of discharge. Xarelto has been utilized for DVT prophylaxis. Patient was noted to have a mild drop in her hemoglobin on postoperative day 2; iron supplementation was started. Patient was discharged to [facility] for rehabilitation on 09/08/2013. Patient's primary care provider is Dr. Lady Deutscher, while in the rehabilitation section she'll be followed by [facility].    Allergies  Allergen Reactions  . Morphine     REACTION: hives       Medication List       This list is accurate as of: 09/09/13  3:49 PM.  Always use your most recent med list.               ALPRAZolam 0.25 MG tablet  Commonly known as:  XANAX  Take 1 tablet (0.25 mg total) by mouth every 6 (six) hours as needed for anxiety or sleep.     amLODipine 5 MG tablet  Commonly known as:  NORVASC  Take 5 mg by mouth at bedtime.     BIOTIN PO    Take 1,000 mcg by mouth daily.     bisacodyl 5 MG EC tablet  Commonly known as:  DULCOLAX  Take 5 mg by mouth daily as needed for moderate constipation.     budesonide-formoterol 160-4.5 MCG/ACT inhaler  Commonly known as:  SYMBICORT  Inhale 2 puffs into the lungs 2 (two) times daily.     DSS 100 MG Caps  Take 100 mg by mouth 2 (two) times daily.     ferrous sulfate 325 (65 FE) MG tablet  Take 325 mg by mouth 2 (two) times daily with a meal.     glucosamine-chondroitin 500-400 MG tablet  Take 1 tablet by mouth 2 (two) times daily.     hydrochlorothiazide 12.5 MG tablet  Commonly known as:  HYDRODIURIL  Take 12.5 mg by mouth every morning.     HYDROcodone-acetaminophen 7.5-325 MG per tablet  Commonly known as:  NORCO  Take 1 tablet by mouth every 4 (four) hours as needed for moderate pain.     losartan 100 MG tablet  Commonly known as:  COZAAR  Take 100 mg by mouth every evening.     polyethylene glycol packet  Commonly known as:  MIRALAX / GLYCOLAX  Take 17 g by mouth 2 (two) times daily.     rivaroxaban 10 MG Tabs tablet  Commonly known as:  XARELTO  Take 1 tablet (10 mg total) by  mouth daily.     tiZANidine 4 MG capsule  Commonly known as:  ZANAFLEX  Take 1 capsule (4 mg total) by mouth 3 (three) times daily as needed for muscle spasms.     Vitamin D3 1000 UNITS Caps  Take 1 tablet by mouth daily.        DATA REVIEWED  Radiologic Exams  09/06/2013   CHEST 2 VIEW COMPARISON: CT CHEST W/CM dated 05/30/2013; DG CHEST 2 VIEW dated 05/24/2013 .  IMPRESSION:  Persistent left upper and bibasilar unchanged interstitial prominence. Again an atypical infection and/or scarring could present in this fashion. No change noted from prior chest CT of 05/30/2013 .   Cardiovascular Exams:   Laboratory Studies Lab Results  Component Value Date   WBC 8.6 09/08/2013   HGB 10.2* 09/08/2013   HCT 31.2* 09/08/2013   MCV 100.3* 09/08/2013   PLT 191 09/08/2013   Lab Results   Component Value Date   NA 134* 09/08/2013   K 4.0 09/08/2013   GLUCOSE 148* 09/08/2013   BUN 16 09/08/2013   CREATININE 0.51 09/08/2013        Past Medical History  Diagnosis Date  . COPD (chronic obstructive pulmonary disease)     pft's 09/15/2005, FEV1 1.69 (76%) with ration67 and better x 14% p B2, DLC 95%  . Hypertension   . Bronchitis, chronic   . Celiac disease   . Anxiety   . Pneumonia     hx of x3  . Arthritis     severe  . Anemia     "occasional"   Past Surgical History  Procedure Laterality Date  . Nasal sinus surgery  years ago  . Total knee arthroplasty Bilateral [id number]  . Excision morton's neuroma Bilateral 35 years ago, 2010  . Total hip arthroplasty  2009    right  . Cataract extraction Bilateral ~8 years ago  . Total hip arthroplasty Left 09/06/2013    Procedure: LEFT TOTAL HIP ARTHROPLASTY ANTERIOR APPROACH;  Surgeon: Mauri Pole, MD;  Location: [facility] ORS;  Service: Orthopedics;  Laterality: Left;   Family Status  Relation Status Death Age  . Mother Deceased   . Father Deceased   . Son Alive   . Daughter Alive    History   Social History Narrative   Patient is Widowed. Retired at 80 years old. Lives in apartment,  Independent Living section at [place] since 05/2013.   Stopped smoking 2012, moderate alcohol intake    Patient has Advanced planning documents: Living Will                   REVIEW OF SYSTEMS  DATA OBTAINED: from patient, medical record GENERAL: Feels well   No recent fever. Feels tired, mild drop in appetite. Notes weight gain since the first of the year  SKIN: No itch, rash or open wounds EYES: No eye pain, dryness or itching  No change in vision EARS: No earache, tinnitus, change in hearing NOSE: No congestion, drainage or bleeding MOUTH/THROAT: No mouth or tooth pain  No sore throat   No difficulty chewing or swallowing RESPIRATORY: No cough, wheezing, SOB CARDIAC: No chest pain, palpitations  No  edema. GI: No abdominal pain  No nausea, vomiting,diarrhea or constipation  No heartburn or reflux  GU: No dysuria, frequency or urgency  No change in urine volume or character  MUSCULOSKELETAL: Left hip soreness, denies actual pain  No back pain  No muscle ache, pain, weakness  Feels gait  is steady w/ walker.  "Hospital PT remarked that I was progressing remarkably well" NEUROLOGIC: No dizziness, fainting, headache, numbness  No change in mental status.  PSYCHIATRIC: No feelings of anxiety, depression  Sleeps well.     PHYSICAL EXAM Filed Vitals:   09/09/13 1537  BP: 124/81  Pulse: 89  Temp: 97 F (36.1 C)  Resp: 16  Weight: 188 lb 6.4 oz (85.458 kg)  SpO2: 90%   Body mass index is 30.42 kg/(m^2).  GENERAL APPEARANCE: No acute distress, appropriately groomed, overweight body habitus. Alert, pleasant, conversant. SKIN: No diaphoresis, rash, unusual lesions HEAD: Normocephalic, atraumatic EYES: Conjunctiva/lids clear. Pupils round, reactive. EOMs intact.  EARS: External exam WNL, Hearing grossly normal. NOSE: No deformity or discharge. MOUTH/THROAT: Lips w/o lesions. Oral mucosa, tongue moist, w/o lesion. Oropharynx w/o redness or lesions.  NECK: Supple, full ROM. No thyroid tenderness, enlargement or nodule LYMPHATICS: No head, neck or supraclavicular adenopathy RESPIRATORY: Breathing is even, unlabored. Lung sounds are clear and full.  CARDIOVASCULAR: Heart RRR. No murmur or extra heart sounds  ARTERIAL: No carotid bruit.   VENOUS: No varicosities. No venous stasis skin changes  EDEMA: No peripheral edema.  GASTROINTESTINAL: Abdomen is soft, non-tender, not distended w/ normal bowel sounds.  MUSCULOSKELETAL: Moves UE/ RT LE extremities with full ROM, strength and tone. Left LE with decreased ROM/strength, incision site is covered with clean dry surgical dressing, no surrounding edema or erythema.  Back is without kyphosis, scoliosis or spinal process tenderness.   NEUROLOGIC:  Oriented to time, place, person. Cranial nerves 2-12 grossly intact, speech clear, no tremor.  PSYCHIATRIC: Mood and affect appropriate to situation    ASSESSMENT/PLAN  S/P left THA, AA Status post hip replacement earlier this week, no significant surgical site pain. Incision remains covered with clean dry surgical dressing. No surrounding erythema or edema. Patient is ambulating short distances w/ walker, PT/OT have ben initiated. Patient expects to be discharged to her independent living home within a week  COPD GOLD II Stable respiratory status  HYPERTENSION Pressure range satisfactory, continue current medications.   Expected blood loss anemia Mild drop in hemoglobin after surgery, repeat CBC 09/13/2013    Follow up: Return As needed. 09/13/13: CBC, BMP.     Mardene Celeste, NP-C [place] [phone]  09/09/2013

## 2013-09-12 ENCOUNTER — Encounter: Payer: Self-pay | Admitting: Internal Medicine

## 2013-09-12 ENCOUNTER — Non-Acute Institutional Stay (SKILLED_NURSING_FACILITY): Payer: Medicare Other | Admitting: Internal Medicine

## 2013-09-12 DIAGNOSIS — J449 Chronic obstructive pulmonary disease, unspecified: Secondary | ICD-10-CM

## 2013-09-12 DIAGNOSIS — I10 Essential (primary) hypertension: Secondary | ICD-10-CM

## 2013-09-12 DIAGNOSIS — D62 Acute posthemorrhagic anemia: Secondary | ICD-10-CM

## 2013-09-12 DIAGNOSIS — Z96649 Presence of unspecified artificial hip joint: Secondary | ICD-10-CM

## 2013-09-12 DIAGNOSIS — J4489 Other specified chronic obstructive pulmonary disease: Secondary | ICD-10-CM

## 2013-09-12 DIAGNOSIS — J479 Bronchiectasis, uncomplicated: Secondary | ICD-10-CM

## 2013-09-12 NOTE — Progress Notes (Signed)
Patient ID: [name] [name], female   DOB: Apr 04, 1931, 80 y.o.   MRN: [id number]    Nursing Home Location:  [facility] of Service: SNF (31)  PCP: Osborne Casco, MD  Code Status: LIVING WILL, DNR  Allergies  Allergen Reactions  . Morphine     REACTION: hives    Chief Complaint  Patient presents with  . Hospitalization Follow-up    new admit to SNF following hospitalization    HPI:  Admitted to SNF 09/08/13 following hospitalization from 09/06/13 to 2/5/`4 for left total hip arthroplasty by anterior approach by Dr. Salli Quarry.she had expected acute blood loss anemia post operatively. Hgb dropped to 10.2 gm %. She is already moving well on a walker. She has no other significant complaints and her other chronic problems are under control.  Problems include HTN, COPD, chronic bronchitis as a result of smoking, celiac disease, anxiety, and pains in the left shoulder and left elbow. She stopped smoking 2 years ago. She thinks that she will need joint replacement of the left elbow and shoulder in the future.   Past Medical History  Diagnosis Date  . COPD (chronic obstructive pulmonary disease)     pft's 09/15/2005, FEV1 1.69 (76%) with ration67 and better x 14% p B2, DLC 95%  . Hypertension   . Bronchitis, chronic   . Celiac disease   . Anxiety   . Pneumonia     hx of x3  . Arthritis     severe  . Anemia     "occasional"  . Acute blood loss anemia 09/07/13  . Anxiety state, unspecified   . Unspecified constipation   . Pain in left elbow   . Pain in left shoulder   . Bronchiectasis 2012  . Cerebrovascular disease, unspecified 05/26/13  . Cerebral atrophy 05/26/13    Past Surgical History  Procedure Laterality Date  . Nasal sinus surgery  years ago  . Total knee arthroplasty Bilateral [id number]  . Excision morton's neuroma Bilateral 35 years ago, 2010  . Total hip arthroplasty Right 2009    Dr. Telford Nab  . Cataract extraction Bilateral ~8  years ago  . Total hip arthroplasty Left 09/06/2013    Dr. Salli Quarry    CONSULTANTS Ortho: olin Pulmo:  Melvyn Novas PCP: Kelton Pillar Cardio: P[place] Handler: Gershon Crane  PAST PROCEDURES 01/09/09 Gastric emptying: delayed 03/27/11 CT head: cerebral ischemia, cerebral atrophy 05/26/13 CT chest: bronchiectasis  Social History: History   Social History  . Marital Status: Widowed    Spouse Name: N/A    Number of Children: N/A  . 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 Smoking status: Former Smoker -- 0.50 packs/day for 60 years    Types: Cigarettes    Quit date: 02/05/2011  . Smokeless tobacco: Never Used  . Alcohol Use: Yes     Comment: Wine and Gin daily  . Drug Use: No  . Sexual Activity: None   Other Topics Concern  . None   Social History Narrative   Patient is Widowed. Retired at 80 years old. Lives in apartment,  Independent Living section at [place] since 05/2013.   Stopped smoking 2012, moderate alcohol intake    Patient has Advanced planning documents: Living Will                   Family History Family Status  Relation Status Death Age  .  Mother Deceased   . Father Deceased   . Son Alive   . Daughter Alive    Family History  Problem Relation Age of Onset  . Heart disease Father 35    heart attack  . Prostate cancer Paternal Grandfather   . Pulmonary fibrosis Brother     former smoker     Medications: Patient's Medications  New Prescriptions   No medications on file  Previous Medications   ALPRAZOLAM (XANAX) 0.25 MG TABLET    Take 1 tablet (0.25 mg total) by mouth every 6 (six) hours as needed for anxiety or sleep.   AMLODIPINE (NORVASC) 5 MG TABLET    Take 5 mg by mouth at bedtime.    BIOTIN PO    Take 1,000 mcg by mouth daily.   BISACODYL (DULCOLAX) 5 MG EC TABLET    Take 5 mg by mouth daily as needed for moderate constipation.   BUDESONIDE-FORMOTEROL (SYMBICORT)  160-4.5 MCG/ACT INHALER    Inhale 2 puffs into the lungs 2 (two) times daily.     CHOLECALCIFEROL (VITAMIN D3) 1000 UNITS CAPS    Take 1 tablet by mouth daily.     CIPROFLOXACIN (CIPRO) 500 MG TABLET       DOCUSATE SODIUM 100 MG CAPS    Take 100 mg by mouth 2 (two) times daily.   FERROUS SULFATE 325 (65 FE) MG TABLET    Take 325 mg by mouth 2 (two) times daily with a meal.   GLUCOSAMINE-CHONDROITIN 500-400 MG TABLET    Take 1 tablet by mouth 2 (two) times daily.     HYDROCHLOROTHIAZIDE (HYDRODIURIL) 12.5 MG TABLET    Take 12.5 mg by mouth every morning.   HYDROCODONE-ACETAMINOPHEN (NORCO) 7.5-325 MG PER TABLET    Take 1 tablet by mouth every 4 (four) hours as needed for moderate pain.   LOSARTAN (COZAAR) 100 MG TABLET    Take 100 mg by mouth every evening.    POLYETHYLENE GLYCOL (MIRALAX / GLYCOLAX) PACKET    Take 17 g by mouth 2 (two) times daily.   RIVAROXABAN (XARELTO) 10 MG TABS TABLET    Take 1 tablet (10 mg total) by mouth daily.   TIZANIDINE (ZANAFLEX) 4 MG CAPSULE    Take 1 capsule (4 mg total) by mouth 3 (three) times daily as needed for muscle spasms.  Modified Medications   No medications on file  Discontinued Medications   No medications on file    Immunization History  Administered Date(s) Administered  . Influenza Whole 04/08/2010, 05/25/2012     Review of Systems  Constitutional: Positive for activity change. Negative for fever, appetite change and fatigue.  HENT: Negative.   Eyes: Positive for visual disturbance (corrective lenses).  Respiratory: Positive for shortness of breath.   Cardiovascular: Positive for leg swelling. Negative for chest pain and palpitations.  Endocrine: Negative.   Genitourinary: Negative.   Musculoskeletal: Positive for arthralgias and gait problem. Negative for back pain, myalgias and neck pain.       Generalize OA and multiple joint replacements  Skin: Negative.   Allergic/Immunologic: Negative.   Neurological: Negative.   Hematological:        Anemia   Psychiatric/Behavioral: The patient is nervous/anxious.       Filed Vitals:   09/12/13 1129  BP: 124/81  Pulse: 89  Temp: 97.9 F (36.6 C)  Resp: 16  Height: 5\' 7"  (1.702 m)  Weight: 188 lb 6.4 oz (85.458 kg)   Physical Exam  Constitutional: She is oriented  to person, place, and time.  overweight  HENT:  Right Ear: External ear normal.  Left Ear: External ear normal.  Nose: Nose normal.  Mouth/Throat: Oropharynx is clear and moist.  Eyes: Conjunctivae and EOM are normal. Pupils are equal, round, and reactive to light.  Neck: No JVD present. No tracheal deviation present. No thyromegaly present.  Cardiovascular: Normal rate, regular rhythm, normal heart sounds and intact distal pulses.  Exam reveals no gallop and no friction rub.   No murmur heard. Respiratory: No respiratory distress. She has no wheezes. She has no rales. She exhibits no tenderness.  GI: She exhibits no distension and no mass. There is no tenderness.  Musculoskeletal: Normal range of motion. She exhibits edema. She exhibits no tenderness.  Lymphadenopathy:    She has no cervical adenopathy.  Neurological: She is alert and oriented to person, place, and time. She has normal reflexes. No cranial nerve deficit. Coordination normal.  Skin: Skin is warm and dry. No rash noted. No erythema. No pallor.  Psychiatric: She has a normal mood and affect. Her behavior is normal. Judgment and thought content normal.       Labs reviewed:  Basic Metabolic Panel:  Recent Labs  09/01/13 1350 09/07/13 0425 09/08/13 0433  NA 135* 133* 134*  K 4.3 5.0 4.0  CL 95* 94* 97  CO2 28 26 27   GLUCOSE 112* 158* 148*  BUN 23 15 16   CREATININE 0.55 0.49* 0.51  CALCIUM 9.3 8.2* 8.3*   Liver Function Tests: No results found for this basename: AST, ALT, ALKPHOS, BILITOT, PROT, ALBUMIN,  in the last 8760 hours No results found for this basename: LIPASE, AMYLASE,  in the last 8760 hours No results found for  this basename: AMMONIA,  in the last 8760 hours CBC:  Recent Labs  09/01/13 1350 09/07/13 0425 09/08/13 0433  WBC 6.5 6.4 8.6  HGB 13.0 11.2* 10.2*  HCT 41.1 33.8* 31.2*  MCV 101.0* 99.4 100.3*  PLT 237 198 191     Assessment/Plan S/P left THA, AA: here for Short term rehab. Engaged in [place] and OT. Has concerns about how she will get the compression hosiery on and off when she returns to her apt. She is to see Dr. Alvan Dame in about 2 weeks. Wound is doing well and is still covered by the bandage applied after surgery.  Expected blood loss anemia: CBC is pending  COPD GOLD II: stable  Bronchiectasis: chronic  HYPERTENSION: controlled    Labs/tests ordered CBC

## 2013-11-08 ENCOUNTER — Other Ambulatory Visit: Payer: Self-pay

## 2013-11-08 DIAGNOSIS — Z1231 Encounter for screening mammogram for malignant neoplasm of breast: Secondary | ICD-10-CM

## 2013-11-29 LAB — PULMONARY FUNCTION TEST
DL/VA % PRED: 77 %
DL/VA: 3.91 ml/min/mmHg/L
DLCO UNC % PRED: 59 %
DLCO unc: 15.91 ml/min/mmHg
FEF 25-75 Post: 0.69 L/sec
FEF 25-75 Pre: 0.63 L/sec
FEF2575-%CHANGE-POST: 9 %
FEF2575-%PRED-POST: 45 %
FEF2575-%Pred-Pre: 41 %
FEV1-%CHANGE-POST: 1 %
FEV1-%PRED-PRE: 64 %
FEV1-%Pred-Post: 64 %
FEV1-PRE: 1.37 L
FEV1-Post: 1.38 L
FEV1FVC-%Change-Post: 0 %
FEV1FVC-%PRED-PRE: 86 %
FEV6-%Change-Post: -1 %
FEV6-%PRED-POST: 77 %
FEV6-%Pred-Pre: 78 %
FEV6-POST: 2.09 L
FEV6-Pre: 2.12 L
FEV6FVC-%Change-Post: -1 %
FEV6FVC-%PRED-PRE: 103 %
FEV6FVC-%Pred-Post: 102 %
FVC-%CHANGE-POST: 0 %
FVC-%Pred-Post: 75 %
FVC-%Pred-Pre: 75 %
FVC-POST: 2.16 L
FVC-Pre: 2.15 L
POST FEV1/FVC RATIO: 64 %
PRE FEV1/FVC RATIO: 64 %
Post FEV6/FVC ratio: 97 %
Pre FEV6/FVC Ratio: 99 %
RV % pred: 87 %
RV: 2.17 L
TLC % pred: 84 %
TLC: 4.53 L

## 2013-12-19 ENCOUNTER — Ambulatory Visit
Admission: RE | Admit: 2013-12-19 | Discharge: 2013-12-19 | Disposition: A | Discharge status: Home / Self Care | Payer: Medicare Other | Source: Ambulatory Visit

## 2013-12-19 ENCOUNTER — Encounter (INDEPENDENT_AMBULATORY_CARE_PROVIDER_SITE_OTHER): Payer: Self-pay

## 2013-12-19 DIAGNOSIS — Z1231 Encounter for screening mammogram for malignant neoplasm of breast: Secondary | ICD-10-CM

## 2014-01-26 ENCOUNTER — Ambulatory Visit (INDEPENDENT_AMBULATORY_CARE_PROVIDER_SITE_OTHER): Payer: Medicare Other | Admitting: Podiatrist

## 2014-01-26 DIAGNOSIS — L84 Corns and callosities: Secondary | ICD-10-CM

## 2014-01-26 NOTE — Progress Notes (Signed)
HPI: Patient presents today for follow up of foot and callus care. Denies any new complaints today. Relates the calluses are painful with shoes and with ambulation    Objective: Patients chart is reviewed. Neurovascular status unchanged. Patient complains of calluses submetatarsal one and 5 of the right foot. Intact integument is noted beneath the lesions. Pain with direct pressure is also noted.    Assessment: Symptomatic calluses x 2    Plan: Discussed treatment options and alternatives. The symptomatic calluses were debrided without complication. Return prn

## 2014-05-03 ENCOUNTER — Emergency Department (HOSPITAL_COMMUNITY): Payer: Medicare Other

## 2014-05-03 ENCOUNTER — Emergency Department (HOSPITAL_COMMUNITY)
Admission: EM | Admit: 2014-05-03 | Discharge: 2014-05-03 | Disposition: A | Discharge status: Home / Self Care | Payer: Medicare Other | Attending: Emergency Medicine | Admitting: Emergency Medicine

## 2014-05-03 ENCOUNTER — Encounter (HOSPITAL_COMMUNITY): Payer: Self-pay | Admitting: Emergency Medicine

## 2014-05-03 DIAGNOSIS — M129 Arthropathy, unspecified: Secondary | ICD-10-CM | POA: Insufficient documentation

## 2014-05-03 DIAGNOSIS — Z8669 Personal history of other diseases of the nervous system and sense organs: Secondary | ICD-10-CM | POA: Diagnosis not present

## 2014-05-03 DIAGNOSIS — I10 Essential (primary) hypertension: Secondary | ICD-10-CM | POA: Diagnosis not present

## 2014-05-03 DIAGNOSIS — Z96659 Presence of unspecified artificial knee joint: Secondary | ICD-10-CM | POA: Diagnosis not present

## 2014-05-03 DIAGNOSIS — F411 Generalized anxiety disorder: Secondary | ICD-10-CM | POA: Insufficient documentation

## 2014-05-03 DIAGNOSIS — Z87891 Personal history of nicotine dependence: Secondary | ICD-10-CM | POA: Insufficient documentation

## 2014-05-03 DIAGNOSIS — IMO0002 Reserved for concepts with insufficient information to code with codable children: Secondary | ICD-10-CM | POA: Insufficient documentation

## 2014-05-03 DIAGNOSIS — Z79899 Other long term (current) drug therapy: Secondary | ICD-10-CM | POA: Diagnosis not present

## 2014-05-03 DIAGNOSIS — J441 Chronic obstructive pulmonary disease with (acute) exacerbation: Secondary | ICD-10-CM | POA: Diagnosis not present

## 2014-05-03 DIAGNOSIS — R002 Palpitations: Secondary | ICD-10-CM | POA: Insufficient documentation

## 2014-05-03 DIAGNOSIS — Z8701 Personal history of pneumonia (recurrent): Secondary | ICD-10-CM | POA: Insufficient documentation

## 2014-05-03 DIAGNOSIS — Z862 Personal history of diseases of the blood and blood-forming organs and certain disorders involving the immune mechanism: Secondary | ICD-10-CM | POA: Insufficient documentation

## 2014-05-03 DIAGNOSIS — Z8719 Personal history of other diseases of the digestive system: Secondary | ICD-10-CM | POA: Diagnosis not present

## 2014-05-03 LAB — CBC WITH DIFFERENTIAL/PLATELET
BASOS ABS: 0 10*3/uL (ref 0.0–0.1)
BASOS PCT: 0 % (ref 0–1)
EOS PCT: 1 % (ref 0–5)
Eosinophils Absolute: 0 10*3/uL (ref 0.0–0.7)
HEMATOCRIT: 40.7 % (ref 36.0–46.0)
Hemoglobin: 13.1 g/dL (ref 12.0–15.0)
Lymphocytes Relative: 20 % (ref 12–46)
Lymphs Abs: 1.3 10*3/uL (ref 0.7–4.0)
MCH: 32.5 pg (ref 26.0–34.0)
MCHC: 32.2 g/dL (ref 30.0–36.0)
MCV: 101 fL — ABNORMAL HIGH (ref 78.0–100.0)
MONO ABS: 0.6 10*3/uL (ref 0.1–1.0)
Monocytes Relative: 10 % (ref 3–12)
NEUTROS ABS: 4.3 10*3/uL (ref 1.7–7.7)
Neutrophils Relative %: 69 % (ref 43–77)
PLATELETS: 249 10*3/uL (ref 150–400)
RBC: 4.03 MIL/uL (ref 3.87–5.11)
RDW: 13 % (ref 11.5–15.5)
WBC: 6.2 10*3/uL (ref 4.0–10.5)

## 2014-05-03 LAB — BASIC METABOLIC PANEL
ANION GAP: 14 (ref 5–15)
BUN: 24 mg/dL — ABNORMAL HIGH (ref 6–23)
CALCIUM: 9.3 mg/dL (ref 8.4–10.5)
CHLORIDE: 99 meq/L (ref 96–112)
CO2: 28 meq/L (ref 19–32)
Creatinine, Ser: 0.7 mg/dL (ref 0.50–1.10)
GFR calc Af Amer: >90 mL/min (ref >90)
GFR calc non Af Amer: 80 mL/min — ABNORMAL LOW (ref >90)
Glucose, Bld: 107 mg/dL — ABNORMAL HIGH (ref 70–99)
Potassium: 3.9 mEq/L (ref 3.7–5.3)
SODIUM: 141 meq/L (ref 137–147)

## 2014-05-03 LAB — I-STAT TROPONIN, ED
TROPONIN I, POC: 0.01 ng/mL (ref 0.00–0.08)
Troponin i, poc: 0.01 ng/mL (ref 0.00–0.08)

## 2014-05-03 LAB — MAGNESIUM: MAGNESIUM: 1.9 mg/dL (ref 1.5–2.5)

## 2014-05-03 MED ORDER — METOPROLOL SUCCINATE ER 25 MG PO TB24: 12.5000 mg | ORAL_TABLET | Freq: Every day | ORAL | Status: DC

## 2014-05-03 NOTE — ED Notes (Signed)
Pt presents with onset of palpitations that began this morning.  Pt reports exertional shortness of breath x 2 days; denies chest discomfort.

## 2014-05-03 NOTE — ED Notes (Signed)
Pt ambulated down hall with standby assistance. Pt tolerated fairly well- denies dizziness. Pt states she is SOB and Weak. O2 sats 95% on room air while ambulating.

## 2014-05-03 NOTE — Discharge Instructions (Signed)
Call Dr. Wynonia Lawman for re-check. Return here with any worsening symptoms.  Palpitations A palpitation is the feeling that your heartbeat is irregular. It may feel like your heart is fluttering or skipping a beat. It may also feel like your heart is beating faster than normal. This is usually not a serious problem. In some cases, you may need more medical tests. HOME CARE  Avoid:  Caffeine in coffee, tea, soft drinks, diet pills, and energy drinks.  Chocolate.  Alcohol.  Stop smoking if you smoke.  Reduce your stress and anxiety. Try:  A method that measures bodily functions so you can learn to control them (biofeedback).  Yoga.  Meditation.  Physical activity such as swimming, jogging, or walking.  Get plenty of rest and sleep. GET HELP IF:  Your fast or irregular heartbeat continues after 24 hours.  Your palpitations occur more often. GET HELP RIGHT AWAY IF:   You have chest pain.  You feel short of breath.  You have a very bad headache.  You feel dizzy or pass out (faint). MAKE SURE YOU:   Understand these instructions.  Will watch your condition.  Will get help right away if you are not doing well or get worse. Document Released: 04/29/2008 Document Revised: 12/05/2013 Document Reviewed: 09/19/2011 [facility] Patient Information 2015 [place], [place]. This information is not intended to replace advice given to you by your health care provider. Make sure you discuss any questions you have with your health care provider.

## 2014-05-03 NOTE — ED Provider Notes (Signed)
CSN: [id number]     Arrival date & time 05/03/14  0907 History   First MD Initiated Contact with Patient 05/03/14 [phone]     No chief complaint on file.    HPI  Pt presents with episodes of feeling like her heart is going fast. Having intermittently for 6 weeks. Occasionally she will feel short of breath with it. Not lightheaded or syncopal. Denies any chest pain. States that she walks or 100 yards he may feel a little breathless. States is not unusual for her "I have  COPD". Quit smoking 10 years ago.  Follow Dr. Wynonia Lawman. Saw him primarily for preop clearance prior to a total hip last spring.  Past Medical History  Diagnosis Date  . COPD (chronic obstructive pulmonary disease)     pft's 09/15/2005, FEV1 1.69 (76%) with ration67 and better x 14% p B2, DLC 95%  . Hypertension   . Bronchitis, chronic   . Celiac disease   . Anxiety   . Pneumonia     hx of x3  . Arthritis     severe  . Anemia     "occasional"  . Acute blood loss anemia 09/07/13  . Anxiety state, unspecified   . Unspecified constipation   . Pain in left elbow   . Pain in left shoulder   . Bronchiectasis 2012  . Cerebrovascular disease, unspecified 05/26/13  . Cerebral atrophy 05/26/13   Past Surgical History  Procedure Laterality Date  . Nasal sinus surgery  years ago  . Total knee arthroplasty Bilateral [id number]  . Excision morton's neuroma Bilateral 35 years ago, 2010  . Total hip arthroplasty Right 2009    Dr. Telford Nab  . Cataract extraction Bilateral ~8 years ago  . Total hip arthroplasty Left 09/06/2013    Dr. Salli Quarry   Family History  Problem Relation Age of Onset  . Heart disease Father 75    heart attack  . Prostate cancer Paternal Grandfather   . Pulmonary fibrosis Brother     former smoker   History  Substance Use Topics  . Smoking status: Former Smoker -- 0.50 packs/day for 60 years    Types: Cigarettes    Quit date: 01/27/2012  . Smokeless tobacco: Never Used  . Alcohol Use: Yes   Comment: Wine and Gin daily   OB History   Grav Para Term Preterm Abortions TAB SAB Ect Mult Living                 Review of Systems  Constitutional: Negative for fever, chills, diaphoresis, appetite change and fatigue.  HENT: Negative for mouth sores, sore throat and trouble swallowing.   Eyes: Negative for visual disturbance.  Respiratory: Positive for shortness of breath. Negative for cough, chest tightness and wheezing.   Cardiovascular: Positive for palpitations. Negative for chest pain.  Gastrointestinal: Negative for nausea, vomiting, abdominal pain, diarrhea and abdominal distention.  Endocrine: Negative for polydipsia, polyphagia and polyuria.  Genitourinary: Negative for dysuria, frequency and hematuria.  Musculoskeletal: Negative for gait problem.  Skin: Negative for color change, pallor and rash.  Neurological: Negative for dizziness, syncope, light-headedness and headaches.  Hematological: Does not bruise/bleed easily.  Psychiatric/Behavioral: Negative for behavioral problems and confusion.      Allergies  Benicar; Metronidazole; and Morphine  Home Medications   Prior to Admission medications   Medication Sig Start Date End Date Taking? Authorizing Provider  acetaminophen (TYLENOL) 650 MG CR tablet Take 1,300 mg by mouth every 8 (eight) hours as needed for pain.  Yes Historical Provider, MD  ALPRAZolam (XANAX) 0.25 MG tablet Take 1 tablet (0.25 mg total) by mouth every 6 (six) hours as needed for anxiety or sleep. 09/08/13  Yes Lucille Passy Babish, PA-C  amLODipine (NORVASC) 5 MG tablet Take 5 mg by mouth at bedtime.    Yes Historical Provider, MD  BIOTIN PO Take 1,000 mcg by mouth daily.   Yes Historical Provider, MD  bismuth subsalicylate (PEPTO BISMOL) 262 MG chewable tablet Chew 524 mg by mouth as needed for indigestion.   Yes Historical Provider, MD  budesonide-formoterol (SYMBICORT) 160-4.5 MCG/ACT inhaler Inhale 2 puffs into the lungs 2 (two) times daily.      Yes Historical Provider, MD  Cholecalciferol (VITAMIN D3) 1000 UNITS CAPS Take 1 tablet by mouth daily.     Yes Historical Provider, MD  diphenhydramine-acetaminophen (TYLENOL PM) 25-500 MG TABS Take 2 tablets by mouth at bedtime.   Yes Historical Provider, MD  glucosamine-chondroitin 500-400 MG tablet Take 1 tablet by mouth 2 (two) times daily.     Yes Historical Provider, MD  hydrochlorothiazide (HYDRODIURIL) 12.5 MG tablet Take 12.5 mg by mouth every morning.   Yes Historical Provider, MD  losartan (COZAAR) 100 MG tablet Take 100 mg by mouth every evening.    Yes Historical Provider, MD  metoprolol succinate (TOPROL-XL) 25 MG 24 hr tablet Take 0.5 tablets (12.5 mg total) by mouth daily. 05/03/14   Tanna Furry, MD   BP 140/87  Pulse 77  Temp(Src) 98 F (36.7 C) (Oral)  Resp 15  Ht 5\' 6"  (1.676 m)  Wt 180 lb (81.647 kg)  BMI 29.07 kg/m2  SpO2 95% Physical Exam  Constitutional: She is oriented to person, place, and time. She appears well-developed and well-nourished. No distress.  HENT:  Head: Normocephalic.  Eyes: Conjunctivae are normal. Pupils are equal, round, and reactive to light. No scleral icterus.  Neck: Normal range of motion. Neck supple. No thyromegaly present.  Cardiovascular: Normal rate and regular rhythm.  Exam reveals no gallop and no friction rub.   No murmur heard. Sinus rhythm. Occasional trigeminal PVCs.  Pulmonary/Chest: Effort normal and breath sounds normal. No respiratory distress. She has no wheezes. She has no rales.  Abdominal: Soft. Bowel sounds are normal. She exhibits no distension. There is no tenderness. There is no rebound.  Musculoskeletal: Normal range of motion.  Neurological: She is alert and oriented to person, place, and time.  Skin: Skin is warm and dry. No rash noted.  Psychiatric: She has a normal mood and affect. Her behavior is normal.    ED Course  Procedures (including critical care time) Labs Review Labs Reviewed  CBC WITH  DIFFERENTIAL - Abnormal; Notable for the following:    MCV 101.0 (*)    All other components within normal limits  BASIC METABOLIC PANEL - Abnormal; Notable for the following:    Glucose, Bld 107 (*)    BUN 24 (*)    GFR calc non Af Amer 80 (*)    All other components within normal limits  MAGNESIUM  TSH  I-STAT TROPOININ, ED  I-STAT TROPOININ, ED    Imaging Review Dg Chest Port 1 View  05/03/2014   CLINICAL DATA:  Tachycardia, history COPD, hypertension  EXAM: PORTABLE CHEST - 1 VIEW  COMPARISON:  Portable exam 1114 hr compared to 09/06/2013 and 05/24/2013 as well as correlated with CT chest of 05/30/2013  FINDINGS: Normal heart size and pulmonary vascularity.  Calcified mildly tortuous thoracic aorta.  Biapical scarring.  Question  underlying emphysematous changes.  No infiltrate, pleural effusion or pneumothorax.  Persistent area of increased lingular density adjacent to LEFT heart border, unchanged since 05/24/2013.  Bones demineralized.  IMPRESSION: Slight hyperinflation with biapical scarring and chronic lingular opacity adjacent to the LEFT heart border unchanged since 05/24/2013 ; this is been previously assessed by CT, recommend correlation with at exam.  No new infiltrate or abnormalities.   Electronically Signed   By: Lavonia Dana M.D.   On: 05/03/2014 11:34     EKG Interpretation None      MDM   Final diagnoses:  Palpitations    Patient with initial and troponin are normal. Asymptomatic here.  Occasional episodes of trigeminy. Discussed with Dr. Wynonia Lawman. He'll see the patient followup in his office for event recorder.    Tanna Furry, MD 05/03/14 [phone]

## 2014-05-07 ENCOUNTER — Emergency Department (HOSPITAL_COMMUNITY): Payer: Medicare Other

## 2014-05-07 ENCOUNTER — Encounter (HOSPITAL_COMMUNITY): Payer: Self-pay | Admitting: Emergency Medicine

## 2014-05-07 ENCOUNTER — Inpatient Hospital Stay (HOSPITAL_COMMUNITY)
Admission: EM | Admit: 2014-05-07 | Discharge: 2014-05-10 | DRG: 310 | Disposition: A | Discharge status: Home / Self Care | Payer: Medicare Other | Attending: Cardiology | Admitting: Cardiology

## 2014-05-07 DIAGNOSIS — G319 Degenerative disease of nervous system, unspecified: Secondary | ICD-10-CM | POA: Diagnosis present

## 2014-05-07 DIAGNOSIS — Z881 Allergy status to other antibiotic agents status: Secondary | ICD-10-CM

## 2014-05-07 DIAGNOSIS — I10 Essential (primary) hypertension: Secondary | ICD-10-CM | POA: Diagnosis present

## 2014-05-07 DIAGNOSIS — Z885 Allergy status to narcotic agent status: Secondary | ICD-10-CM

## 2014-05-07 DIAGNOSIS — Z7901 Long term (current) use of anticoagulants: Secondary | ICD-10-CM

## 2014-05-07 DIAGNOSIS — Z8249 Family history of ischemic heart disease and other diseases of the circulatory system: Secondary | ICD-10-CM

## 2014-05-07 DIAGNOSIS — I712 Thoracic aortic aneurysm, without rupture: Secondary | ICD-10-CM | POA: Diagnosis present

## 2014-05-07 DIAGNOSIS — Z96643 Presence of artificial hip joint, bilateral: Secondary | ICD-10-CM | POA: Diagnosis present

## 2014-05-07 DIAGNOSIS — Z79899 Other long term (current) drug therapy: Secondary | ICD-10-CM

## 2014-05-07 DIAGNOSIS — I480 Paroxysmal atrial fibrillation: Secondary | ICD-10-CM | POA: Diagnosis not present

## 2014-05-07 DIAGNOSIS — D500 Iron deficiency anemia secondary to blood loss (chronic): Secondary | ICD-10-CM | POA: Diagnosis present

## 2014-05-07 DIAGNOSIS — J479 Bronchiectasis, uncomplicated: Secondary | ICD-10-CM | POA: Diagnosis present

## 2014-05-07 DIAGNOSIS — I483 Typical atrial flutter: Secondary | ICD-10-CM

## 2014-05-07 DIAGNOSIS — Z96653 Presence of artificial knee joint, bilateral: Secondary | ICD-10-CM | POA: Diagnosis present

## 2014-05-07 DIAGNOSIS — I2510 Atherosclerotic heart disease of native coronary artery without angina pectoris: Secondary | ICD-10-CM | POA: Diagnosis present

## 2014-05-07 DIAGNOSIS — Z8701 Personal history of pneumonia (recurrent): Secondary | ICD-10-CM

## 2014-05-07 DIAGNOSIS — K900 Celiac disease: Secondary | ICD-10-CM | POA: Diagnosis present

## 2014-05-07 DIAGNOSIS — F419 Anxiety disorder, unspecified: Secondary | ICD-10-CM | POA: Diagnosis present

## 2014-05-07 DIAGNOSIS — Z888 Allergy status to other drugs, medicaments and biological substances status: Secondary | ICD-10-CM

## 2014-05-07 DIAGNOSIS — J449 Chronic obstructive pulmonary disease, unspecified: Secondary | ICD-10-CM | POA: Diagnosis present

## 2014-05-07 DIAGNOSIS — I7121 Aneurysm of the ascending aorta, without rupture: Secondary | ICD-10-CM | POA: Diagnosis present

## 2014-05-07 DIAGNOSIS — Z87891 Personal history of nicotine dependence: Secondary | ICD-10-CM

## 2014-05-07 LAB — I-STAT TROPONIN, ED: Troponin i, poc: 0.01 ng/mL (ref 0.00–0.08)

## 2014-05-07 LAB — I-STAT CHEM 8, ED
BUN: 17 mg/dL (ref 6–23)
CALCIUM ION: 1.05 mmol/L — AB (ref 1.13–1.30)
CREATININE: 0.5 mg/dL (ref 0.50–1.10)
Chloride: 99 mEq/L (ref 96–112)
Glucose, Bld: 106 mg/dL — ABNORMAL HIGH (ref 70–99)
HCT: 43 % (ref 36.0–46.0)
Hemoglobin: 14.6 g/dL (ref 12.0–15.0)
POTASSIUM: 3.8 meq/L (ref 3.7–5.3)
Sodium: 136 mEq/L — ABNORMAL LOW (ref 137–147)
TCO2: 26 mmol/L (ref 0–100)

## 2014-05-07 LAB — CBC
HEMATOCRIT: 39 % (ref 36.0–46.0)
Hemoglobin: 13 g/dL (ref 12.0–15.0)
MCH: 32.7 pg (ref 26.0–34.0)
MCHC: 33.3 g/dL (ref 30.0–36.0)
MCV: 98.2 fL (ref 78.0–100.0)
Platelets: 244 10*3/uL (ref 150–400)
RBC: 3.97 MIL/uL (ref 3.87–5.11)
RDW: 12.6 % (ref 11.5–15.5)
WBC: 7.1 10*3/uL (ref 4.0–10.5)

## 2014-05-07 NOTE — ED Notes (Signed)
Per EMS: pt coming from [facility] with c/o a-fib. Pt states she felt palpitations. Pt has heart monitor placed for her cardiologiest. Upon EMS arrival pt was in A-fib HR150, pt was given 10 mg of Cardizem bolus. Pt HR 90's after bolus. Pt denies chest pain, shortness of breath. Pt A&Ox4, respirations equal and unlabored, skin warm and dry 0

## 2014-05-07 NOTE — ED Provider Notes (Signed)
CSN: [id number]     Arrival date & time 05/07/14  2231 History   First MD Initiated Contact with Patient 05/07/14 2256     Chief Complaint  Patient presents with  . Atrial Fibrillation     (Consider location/radiation/quality/duration/timing/severity/associated sxs/prior Treatment) HPI Comments: The patient is a very pleasant 80 year old female who presents with a complaint of palpitations which were acute in onset this evening, persistent and severe and associated with a feeling of shortness of breath. She denies any chest pain, denies fevers chills nausea vomiting coughing or back pain. According to the medical record the patient was seen several days ago in the emergency department for a similar complaint and at that time was not found to have a diagnosed arrhythmia however she followed up with her cardiologist, it appears that he suspected an underlying atrial arrhythmia and started diltiazem oral medication and placed her on a heart monitor which she has been wearing for the last couple of days. She has been taking her medications as prescribed. The paramedics found the patient to be in atrial fibrillation with a heart rate of 150-160 beats per minute, irregularly irregular rhythm and gave the patient 10 mg of Cardizem by IV bolus which successfully rate controlled the patient. She no longer has any symptoms. The patient also took a Xanax tablet prior to arrival. She denies any history of having obstructive heart disease though she does have COPD, hypertension, celiac disease and several arthritic joints  Patient is a 80 y.o. female presenting with atrial fibrillation. The history is provided by the patient and medical records.  Atrial Fibrillation    Past Medical History  Diagnosis Date  . COPD (chronic obstructive pulmonary disease)     pft's 09/15/2005, FEV1 1.69 (76%) with ration67 and better x 14% p B2, DLC 95%  . Hypertension   . Bronchitis, chronic   . Celiac disease   . Anxiety    . Pneumonia     hx of x3  . Acute blood loss anemia 09/07/13  . Anxiety state, unspecified   . Bronchiectasis 2012  . Cerebrovascular disease, unspecified 05/26/13  . Cerebral atrophy 05/26/13   Past Surgical History  Procedure Laterality Date  . Nasal sinus surgery  years ago  . Total knee arthroplasty Bilateral [id number]  . Excision morton's neuroma Bilateral 35 years ago, 2010  . Total hip arthroplasty Right 2009    Dr. Telford Nab  . Cataract extraction Bilateral ~8 years ago  . Total hip arthroplasty Left 09/06/2013    Dr. Salli Quarry   Family History  Problem Relation Age of Onset  . Heart disease Father 46    heart attack  . Prostate cancer Paternal Grandfather   . Pulmonary fibrosis Brother     former smoker   History  Substance Use Topics  . Smoking status: Former Smoker -- 0.50 packs/day for 60 years    Types: Cigarettes    Quit date: 01/27/2012  . Smokeless tobacco: Never Used  . Alcohol Use: Yes     Comment: Wine and Gin daily   OB History   Grav Para Term Preterm Abortions TAB SAB Ect Mult Living                 Review of Systems  All other systems reviewed and are negative.     Allergies  Benicar; Metronidazole; and Morphine  Home Medications   Prior to Admission medications   Medication Sig Start Date End Date Taking? Authorizing Provider  acetaminophen (TYLENOL) 650  MG CR tablet Take 1,300 mg by mouth every 8 (eight) hours as needed for pain.   Yes Historical Provider, MD  ALPRAZolam (XANAX) 0.25 MG tablet Take 1 tablet (0.25 mg total) by mouth every 6 (six) hours as needed for anxiety or sleep. 09/08/13  Yes Lucille Passy Babish, PA-C  BIOTIN PO Take 1,000 mcg by mouth daily.   Yes Historical Provider, MD  bismuth subsalicylate (PEPTO BISMOL) 262 MG chewable tablet Chew 524 mg by mouth as needed for indigestion.   Yes Historical Provider, MD  budesonide-formoterol (SYMBICORT) 160-4.5 MCG/ACT inhaler Inhale 2 puffs into the lungs 2 (two) times daily.      Yes Historical Provider, MD  Cholecalciferol (VITAMIN D3) 1000 UNITS CAPS Take 1 tablet by mouth daily.     Yes Historical Provider, MD  diltiazem (CARDIZEM CD) 120 MG 24 hr capsule Take 120 mg by mouth 2 (two) times daily. 05/05/14  Yes Historical Provider, MD  diphenhydramine-acetaminophen (TYLENOL PM) 25-500 MG TABS Take 2 tablets by mouth at bedtime.   Yes Historical Provider, MD  glucosamine-chondroitin 500-400 MG tablet Take 1 tablet by mouth 2 (two) times daily.     Yes Historical Provider, MD  hydrochlorothiazide (HYDRODIURIL) 12.5 MG tablet Take 12.5 mg by mouth every morning.   Yes Historical Provider, MD  losartan (COZAAR) 100 MG tablet Take 100 mg by mouth every evening.    Yes Historical Provider, MD   BP 114/70  Pulse 72  Temp(Src) 97.8 F (36.6 C) (Oral)  Resp 16  Ht 5\' 9"  (1.753 m)  Wt 185 lb 14.4 oz (84.324 kg)  BMI 27.44 kg/m2  SpO2 99% Physical Exam  Nursing note and vitals reviewed. Constitutional: She appears well-developed and well-nourished. No distress.  HENT:  Head: Normocephalic and atraumatic.  Mouth/Throat: Oropharynx is clear and moist. No oropharyngeal exudate.  Eyes: Conjunctivae and EOM are normal. Pupils are equal, round, and reactive to light. Right eye exhibits no discharge. Left eye exhibits no discharge. No scleral icterus.  Neck: Normal range of motion. Neck supple. No JVD present. No thyromegaly present.  Cardiovascular: Normal rate, normal heart sounds and intact distal pulses.  Exam reveals no gallop and no friction rub.   No murmur heard. Heart rate 80-90 beats per minute, rhythm is irregularly irregular, normal pulses at the radial arteries, no JVD  Pulmonary/Chest: Effort normal and breath sounds normal. No respiratory distress. She has no wheezes. She has no rales.  Abdominal: Soft. Bowel sounds are normal. She exhibits no distension and no mass. There is no tenderness.  Musculoskeletal: Normal range of motion. She exhibits edema (minimal left  ankle edema compared to the right).  Lymphadenopathy:    She has no cervical adenopathy.  Neurological: She is alert. Coordination normal.  Skin: Skin is warm and dry. No rash noted. No erythema.  Psychiatric: She has a normal mood and affect. Her behavior is normal.    ED Course  Procedures (including critical care time) Labs Review Labs Reviewed  COMPREHENSIVE METABOLIC PANEL - Abnormal; Notable for the following:    Glucose, Bld 105 (*)    GFR calc non Af Amer 88 (*)    All other components within normal limits  BASIC METABOLIC PANEL - Abnormal; Notable for the following:    Sodium 136 (*)    GFR calc non Af Amer 85 (*)    All other components within normal limits  I-STAT CHEM 8, ED - Abnormal; Notable for the following:    Sodium 136 (*)  Glucose, Bld 106 (*)    Calcium, Ion 1.05 (*)    All other components within normal limits  CBC  MAGNESIUM  TSH  I-STAT TROPOININ, ED    Imaging Review Ct Chest Wo Contrast  05/08/2014   CLINICAL DATA:  Initial encounter for tachycardia with known history of ascending aortic aneurysm.  EXAM: CT CHEST WITHOUT CONTRAST  TECHNIQUE: Multidetector CT imaging of the chest was performed following the standard protocol without IV contrast.  COMPARISON:  CT chest with contrast 05/30/2013.  FINDINGS: Noncontrast imaging of the chest demonstrates interval increase in size of the ascending aortic aneurysm, now measuring 4.4 cm on coronal reformats compared with 4.2 cm on the prior study. The descending thoracic aorta is of normal caliber. The heart size is normal. Coronary artery calcifications are present. Atherosclerotic calcifications are present at the aortic arch. The thoracic inlet is unremarkable.  The thoracic inlet is clear. A small hiatal hernia is present without change.  Ill-defined areas of ground-glass attenuation in the inferior right upper lobe correspond with areas are previous infection. Focal bronchial artery ectasia in the lateral  right lower lobe is stable. Chronic bronchiectasis in the lingula is again noted with decreased airspace disease.  IMPRESSION: 1. Interval increase in size of the ascending aorta, now measuring 4.4 cm. This may be less sac crit without contrast. 2. Diffuse atherosclerotic calcifications including coronary artery disease. 3. Areas of bronchiectasis and ill-defined airspace disease compatible with chronic atypical infection such as MAI. 4. No acute abnormality.   Electronically Signed   By: Lawrence Santiago M.D.   On: 05/08/2014 01:41   Dg Chest Port 1 View  05/08/2014   CLINICAL DATA:  Subsequent encounter for tachycardia for the last 5 days. New onset of chest pain.  EXAM: PORTABLE CHEST - 1 VIEW  COMPARISON:  One-view chest 9/ 30/15.  FINDINGS: The heart is mildly enlarged. Atherosclerotic changes are present. Chronic interstitial coarsening is stable. A lingular density is unchanged. Advanced degenerative changes are again noted in both shoulders.  IMPRESSION: 1. Stable cardiomegaly without failure. 2. No acute cardiopulmonary disease. 3. Atherosclerosis.   Electronically Signed   By: Lawrence Santiago M.D.   On: 05/08/2014 00:28     EKG Interpretation   Date/Time:  Sunday May 07 2014 22:42:51 EDT Ventricular Rate:  83 PR Interval:    QRS Duration: 89 QT Interval:  422 QTC Calculation: 496 R Axis:   -27 Text Interpretation:  Atrial flutter Borderline left axis deviation RSR'  in V1 or V2, probably normal variant Repol abnrm, severe global ischemia  (LM/MVD) Since last tracing aflutter now present Confirmed by Abdifatah Colquhoun  MD,  Carlus Stay ([id number]) on 05/07/2014 10:50:58 PM      MDM   Final diagnoses:  Typical atrial flutter    The patient is very comfortable appearing, her heart rate is now controlled, her EKG shows atrial flutter with variable atrioventricular conduction, I will discuss the patient's care with the cardiologist and obtain basic labs. Review of the medical records shows that blood  work drawn on September 30 showed normal blood counts, unremarkable electrolytes and 2 normal troponins.  Discussed with Dr. Wynonia Lawman who will admit for observation given ongoing arrhythmia  Johnna Acosta, MD 05/08/14 [phone]

## 2014-05-08 ENCOUNTER — Encounter (HOSPITAL_COMMUNITY): Payer: Self-pay | Admitting: Radiology

## 2014-05-08 ENCOUNTER — Inpatient Hospital Stay (HOSPITAL_COMMUNITY): Payer: Medicare Other

## 2014-05-08 DIAGNOSIS — J479 Bronchiectasis, uncomplicated: Secondary | ICD-10-CM | POA: Diagnosis present

## 2014-05-08 DIAGNOSIS — F419 Anxiety disorder, unspecified: Secondary | ICD-10-CM | POA: Diagnosis present

## 2014-05-08 DIAGNOSIS — Z87891 Personal history of nicotine dependence: Secondary | ICD-10-CM | POA: Diagnosis not present

## 2014-05-08 DIAGNOSIS — Z881 Allergy status to other antibiotic agents status: Secondary | ICD-10-CM | POA: Diagnosis not present

## 2014-05-08 DIAGNOSIS — I2510 Atherosclerotic heart disease of native coronary artery without angina pectoris: Secondary | ICD-10-CM | POA: Diagnosis present

## 2014-05-08 DIAGNOSIS — G319 Degenerative disease of nervous system, unspecified: Secondary | ICD-10-CM | POA: Diagnosis present

## 2014-05-08 DIAGNOSIS — K900 Celiac disease: Secondary | ICD-10-CM | POA: Diagnosis present

## 2014-05-08 DIAGNOSIS — I10 Essential (primary) hypertension: Secondary | ICD-10-CM | POA: Diagnosis present

## 2014-05-08 DIAGNOSIS — Z888 Allergy status to other drugs, medicaments and biological substances status: Secondary | ICD-10-CM | POA: Diagnosis not present

## 2014-05-08 DIAGNOSIS — D500 Iron deficiency anemia secondary to blood loss (chronic): Secondary | ICD-10-CM | POA: Diagnosis present

## 2014-05-08 DIAGNOSIS — Z8249 Family history of ischemic heart disease and other diseases of the circulatory system: Secondary | ICD-10-CM | POA: Diagnosis not present

## 2014-05-08 DIAGNOSIS — Z7901 Long term (current) use of anticoagulants: Secondary | ICD-10-CM | POA: Diagnosis not present

## 2014-05-08 DIAGNOSIS — Z96653 Presence of artificial knee joint, bilateral: Secondary | ICD-10-CM | POA: Diagnosis present

## 2014-05-08 DIAGNOSIS — Z8701 Personal history of pneumonia (recurrent): Secondary | ICD-10-CM | POA: Diagnosis not present

## 2014-05-08 DIAGNOSIS — Z96643 Presence of artificial hip joint, bilateral: Secondary | ICD-10-CM | POA: Diagnosis present

## 2014-05-08 DIAGNOSIS — I712 Thoracic aortic aneurysm, without rupture: Secondary | ICD-10-CM | POA: Diagnosis present

## 2014-05-08 DIAGNOSIS — J449 Chronic obstructive pulmonary disease, unspecified: Secondary | ICD-10-CM | POA: Diagnosis present

## 2014-05-08 DIAGNOSIS — I480 Paroxysmal atrial fibrillation: Secondary | ICD-10-CM | POA: Diagnosis present

## 2014-05-08 DIAGNOSIS — Z885 Allergy status to narcotic agent status: Secondary | ICD-10-CM | POA: Diagnosis not present

## 2014-05-08 DIAGNOSIS — Z79899 Other long term (current) drug therapy: Secondary | ICD-10-CM | POA: Diagnosis not present

## 2014-05-08 LAB — BASIC METABOLIC PANEL
Anion gap: 9 (ref 5–15)
BUN: 15 mg/dL (ref 6–23)
CALCIUM: 8.9 mg/dL (ref 8.4–10.5)
CO2: 29 mEq/L (ref 19–32)
CREATININE: 0.58 mg/dL (ref 0.50–1.10)
Chloride: 98 mEq/L (ref 96–112)
GFR, EST NON AFRICAN AMERICAN: 85 mL/min — AB (ref >90)
Glucose, Bld: 96 mg/dL (ref 70–99)
Potassium: 4.3 mEq/L (ref 3.7–5.3)
Sodium: 136 mEq/L — ABNORMAL LOW (ref 137–147)

## 2014-05-08 LAB — COMPREHENSIVE METABOLIC PANEL
ALT: 16 U/L (ref 0–35)
AST: 19 U/L (ref 0–37)
Albumin: 4 g/dL (ref 3.5–5.2)
Alkaline Phosphatase: 60 U/L (ref 39–117)
Anion gap: 14 (ref 5–15)
BILIRUBIN TOTAL: 0.3 mg/dL (ref 0.3–1.2)
BUN: 17 mg/dL (ref 6–23)
CHLORIDE: 97 meq/L (ref 96–112)
CO2: 27 mEq/L (ref 19–32)
Calcium: 9.2 mg/dL (ref 8.4–10.5)
Creatinine, Ser: 0.52 mg/dL (ref 0.50–1.10)
GFR calc Af Amer: >90 mL/min (ref >90)
GFR, EST NON AFRICAN AMERICAN: 88 mL/min — AB (ref >90)
Glucose, Bld: 105 mg/dL — ABNORMAL HIGH (ref 70–99)
Potassium: 4 mEq/L (ref 3.7–5.3)
Sodium: 138 mEq/L (ref 137–147)
Total Protein: 7.7 g/dL (ref 6.0–8.3)

## 2014-05-08 LAB — MAGNESIUM: MAGNESIUM: 2.1 mg/dL (ref 1.5–2.5)

## 2014-05-08 LAB — TSH: TSH: 2.47 u[IU]/mL (ref 0.350–4.500)

## 2014-05-08 MED ORDER — SODIUM CHLORIDE 0.9 % IV SOLN: 250.0000 mL | INTRAVENOUS | Status: DC | PRN

## 2014-05-08 MED ORDER — SODIUM CHLORIDE 0.9 % IV SOLN
250.0000 mL | INTRAVENOUS | Status: DC | PRN
Start: 2014-05-08 — End: 2014-05-10

## 2014-05-08 MED ORDER — BUDESONIDE-FORMOTEROL FUMARATE 160-4.5 MCG/ACT IN AERO
2.0000 | INHALATION_SPRAY | Freq: Two times a day (BID) | RESPIRATORY_TRACT | Status: DC
  Administered 2014-05-08 – 2014-05-10 (×5): 2 via RESPIRATORY_TRACT
  Filled 2014-05-08 (×2): qty 6

## 2014-05-08 MED ORDER — APIXABAN 5 MG PO TABS: 5.0000 mg | ORAL_TABLET | Freq: Two times a day (BID) | ORAL | Status: DC

## 2014-05-08 MED ORDER — LOSARTAN POTASSIUM 50 MG PO TABS
100.0000 mg | ORAL_TABLET | Freq: Every evening | ORAL | Status: DC
  Administered 2014-05-08 – 2014-05-09 (×2): 100 mg via ORAL
  Filled 2014-05-08 (×3): qty 2

## 2014-05-08 MED ORDER — SODIUM CHLORIDE 0.9 % IJ SOLN
3.0000 mL | INTRAMUSCULAR | Status: DC | PRN
Start: 2014-05-08 — End: 2014-05-10

## 2014-05-08 MED ORDER — POTASSIUM CHLORIDE CRYS ER 20 MEQ PO TBCR
40.0000 meq | EXTENDED_RELEASE_TABLET | Freq: Once | ORAL | Status: AC
  Administered 2014-05-08: 40 meq via ORAL
  Filled 2014-05-08: qty 2

## 2014-05-08 MED ORDER — APIXABAN 5 MG PO TABS
5.0000 mg | ORAL_TABLET | Freq: Two times a day (BID) | ORAL | Status: DC
  Administered 2014-05-08 – 2014-05-10 (×6): 5 mg via ORAL
  Filled 2014-05-08 (×7): qty 1

## 2014-05-08 MED ORDER — SODIUM CHLORIDE 0.9 % IJ SOLN: 3.0000 mL | INTRAMUSCULAR | Status: DC | PRN

## 2014-05-08 MED ORDER — SODIUM CHLORIDE 0.9 % IJ SOLN
3.0000 mL | Freq: Two times a day (BID) | INTRAMUSCULAR | Status: DC
  Administered 2014-05-08 – 2014-05-10 (×3): 3 mL via INTRAVENOUS

## 2014-05-08 MED ORDER — DOFETILIDE 250 MCG PO CAPS
250.0000 ug | ORAL_CAPSULE | Freq: Two times a day (BID) | ORAL | Status: DC
  Administered 2014-05-08 – 2014-05-10 (×5): 250 ug via ORAL
  Filled 2014-05-08 (×7): qty 1

## 2014-05-08 MED ORDER — ACETAMINOPHEN 325 MG PO TABS: 650.0000 mg | ORAL_TABLET | ORAL | Status: DC | PRN

## 2014-05-08 MED ORDER — OFF THE BEAT BOOK
Freq: Once | Status: AC
  Administered 2014-05-08: 1
  Filled 2014-05-08: qty 1

## 2014-05-08 MED ORDER — DILTIAZEM HCL ER COATED BEADS 120 MG PO CP24
120.0000 mg | ORAL_CAPSULE | Freq: Two times a day (BID) | ORAL | Status: DC
  Administered 2014-05-08 – 2014-05-10 (×6): 120 mg via ORAL
  Filled 2014-05-08 (×8): qty 1

## 2014-05-08 MED ORDER — ALPRAZOLAM 0.25 MG PO TABS
0.2500 mg | ORAL_TABLET | Freq: Four times a day (QID) | ORAL | Status: DC | PRN
  Administered 2014-05-08 – 2014-05-09 (×2): 0.25 mg via ORAL
  Filled 2014-05-08 (×2): qty 1

## 2014-05-08 MED ORDER — SODIUM CHLORIDE 0.9 % IJ SOLN
3.0000 mL | Freq: Two times a day (BID) | INTRAMUSCULAR | Status: DC
  Administered 2014-05-08 – 2014-05-09 (×2): 3 mL via INTRAVENOUS

## 2014-05-08 NOTE — Progress Notes (Signed)
Subjective:  Feeling better today.  No a fib overnight.  Objective:  Vital Signs in the last 24 hours: BP 114/70  Pulse 72  Temp(Src) 97.8 F (36.6 C) (Oral)  Resp 16  Ht 5\' 9"  (1.753 m)  Wt 84.324 kg (185 lb 14.4 oz)  BMI 27.44 kg/m2  SpO2 99%  Physical Exam: Pleasant WF in NAD Lungs:  Clear  Cardiac:  Regular rhythm, normal S1 and S2, no S3 Abdomen:  Soft, nontender, no masses Extremities:  No edema present  Intake/Output from previous day:   Weight Filed Weights   05/08/14 0121  Weight: 84.324 kg (185 lb 14.4 oz)    Lab Results: Basic Metabolic Panel:  Recent Labs  05/08/14 0044 05/08/14 0404  NA 138 136*  K 4.0 4.3  CL 97 98  CO2 27 29  GLUCOSE 105* 96  BUN 17 15  CREATININE 0.52 0.58    CBC:  Recent Labs  05/07/14 2330 05/07/14 2341  WBC 7.1  --   HGB 13.0 14.6  HCT 39.0 43.0  MCV 98.2  --   PLT 244  --    Telemetry: Sinus rhythm, QTC  .44  Assessment/Plan:  1. Paroxysmal atrial fib now in sinus 2. Hypertension 3. Long term use of anticoagulants begun last night 4. Aortic aneurysm slight larger than last year  Rec:  Start Tikosyn this am to suppress a fib. K and Mg OK.  Check echo, Discussed code status.  She did not fully understand implications of DNR and wishes to remain full code for now.,    W. Doristine Church  MD [facility]  05/08/2014, 9:06 AM

## 2014-05-08 NOTE — Discharge Instructions (Signed)
Information on my medicine - ELIQUIS® (apixaban) ° °This medication education was reviewed with me or my healthcare representative as part of my discharge preparation.  ° °Why was Eliquis® prescribed for you? °Eliquis® was prescribed for you to reduce the risk of a blood clot forming that can cause a stroke if you have a medical condition called atrial fibrillation (a type of irregular heartbeat). ° °What do You need to know about Eliquis® ? °Take your Eliquis® 5mg TWICE DAILY - one tablet in the morning and one tablet in the evening with or without food. If you have difficulty swallowing the tablet whole please discuss with your pharmacist how to take the medication safely. ° °Take Eliquis® exactly as prescribed by your doctor and DO NOT stop taking Eliquis® without talking to the doctor who prescribed the medication.  Stopping may increase your risk of developing a stroke.  Refill your prescription before you run out. ° °After discharge, you should have regular check-up appointments with your healthcare provider that is prescribing your Eliquis®.  In the future your dose may need to be changed if your kidney function or weight changes by a significant amount or as you get older. ° °What do you do if you miss a dose? °If you miss a dose, take it as soon as you remember on the same day and resume taking twice daily.  Do not take more than one dose of ELIQUIS at the same time to make up a missed dose. ° °Important Safety Information °A possible side effect of Eliquis® is bleeding. You should call your healthcare provider right away if you experience any of the following: °? Bleeding from an injury or your nose that does not stop. °? Unusual colored urine (red or dark brown) or unusual colored stools (red or black). °? Unusual bruising for unknown reasons. °? A serious fall or if you hit your head (even if there is no bleeding). ° °Some medicines may interact with Eliquis® and might increase your risk of bleeding or  clotting while on Eliquis®. To help avoid this, consult your healthcare provider or pharmacist prior to using any new prescription or non-prescription medications, including herbals, vitamins, non-steroidal anti-inflammatory drugs (NSAIDs) and supplements. ° °This website has more information on Eliquis® (apixaban): http://www.eliquis.com/eliquis/home ° °

## 2014-05-08 NOTE — Progress Notes (Signed)
  Echocardiogram 2D Echocardiogram has been performed.  [name] [name] 05/08/2014, 11:16 AM

## 2014-05-08 NOTE — Progress Notes (Signed)
Utilization Review Completed.Donne Anon T10/12/2013

## 2014-05-08 NOTE — Progress Notes (Signed)
05/08/2014 12:00 PM Off the beat book given and reviewed with patient as well as printed information about Tikosyn. Questions and concerns addressed.  Gay Moncivais, Arville Lime

## 2014-05-08 NOTE — H&P (Addendum)
History and Physical   Admit date: 05/07/2014 Name:  [name] [name] Medical record number: [id number] DOB/Age:  03/13/1931  80 y.o. female  Referring Physician:   [facility]  Primary Cardiologist: Dr. Wynonia Lawman  Primary Physician:   Dr. Kelton Pillar  Chief complaint/reason for admission: Rapid heartbeat  HPI:  This 80 year old female was seen by me for preoperative evaluation prior to hip surgery earlier this year. She had hip surgery and did well. She has known COPD and quit smoking a couple of years ago. She also has chronic hypertension as well as some anxiety. She had a colonoscopy last Monday and developed some fluctuating blood pressure and irregular heart rate following at the went home. She later went to the emergency room the middle of the week complaining of palpitations but was found to be in sinus rhythm. I saw her on Thursday in place an event monitor on her received a phone call Friday showing some atrial fibrillation. I called in a prescription for diltiazem 120 mg twice daily that she has taken over the weekend. She had done fairly well without recurrent symptoms at this evening when a friend came over and after visitng a while has the onset of rapid palpitations and was brought here by EMS where she was found to be in rapid atrial fibrillation. She was given a single dose of diltiazem 10 mg IV and was slow by the time she came here but had converted to sinus rhythm by the time I saw her. She has a family history of cardiac disease but no angina. She has mild edema. She has felt unwell this week since her colonoscopy.  She has a CHADS2VASC score of 4 and will need anticoagulation. She has no history of GI bleeding recently and is highly symptomatic from her atrial fibrillation so may need suppressive therapy. Of note she had a CT scan of her chest done one year ago that showed three-vessel coronary calcification, COPD, as well as a mild ascending aortic aneurysm.   Past  Medical History  Diagnosis Date  . COPD (chronic obstructive pulmonary disease)     pft's 09/15/2005, FEV1 1.69 (76%) with ration67 and better x 14% p B2, DLC 95%  . Hypertension   . Bronchitis, chronic   . Celiac disease   . Anxiety   . Pneumonia     hx of x3  . Acute blood loss anemia 09/07/13  . Anxiety state, unspecified   . Bronchiectasis 2012  . Cerebrovascular disease, unspecified 05/26/13  . Cerebral atrophy 05/26/13    Past Surgical History  Procedure Laterality Date  . Nasal sinus surgery  years ago  . Total knee arthroplasty Bilateral [id number]  . Excision morton's neuroma Bilateral 35 years ago, 2010  . Total hip arthroplasty Right 2009    Dr. Telford Nab  . Cataract extraction Bilateral ~8 years ago  . Total hip arthroplasty Left 09/06/2013    Dr. Salli Quarry   Allergies: is allergic to benicar; metronidazole; and morphine.   Medications: Prior to Admission medications   Medication Sig Start Date End Date Taking? Authorizing Provider  acetaminophen (TYLENOL) 650 MG CR tablet Take 1,300 mg by mouth every 8 (eight) hours as needed for pain.   Yes Historical Provider, MD  ALPRAZolam (XANAX) 0.25 MG tablet Take 1 tablet (0.25 mg total) by mouth every 6 (six) hours as needed for anxiety or sleep. 09/08/13  Yes Lucille Passy Babish, PA-C  BIOTIN PO Take 1,000 mcg by mouth daily.  Yes Historical Provider, MD  bismuth subsalicylate (PEPTO BISMOL) 262 MG chewable tablet Chew 524 mg by mouth as needed for indigestion.   Yes Historical Provider, MD  budesonide-formoterol (SYMBICORT) 160-4.5 MCG/ACT inhaler Inhale 2 puffs into the lungs 2 (two) times daily.     Yes Historical Provider, MD  Cholecalciferol (VITAMIN D3) 1000 UNITS CAPS Take 1 tablet by mouth daily.     Yes Historical Provider, MD  diltiazem (CARDIZEM CD) 120 MG 24 hr capsule Take 120 mg by mouth 2 (two) times daily. 05/05/14  Yes Historical Provider, MD  diphenhydramine-acetaminophen (TYLENOL PM) 25-500 MG TABS Take 2 tablets  by mouth at bedtime.   Yes Historical Provider, MD  glucosamine-chondroitin 500-400 MG tablet Take 1 tablet by mouth 2 (two) times daily.     Yes Historical Provider, MD  hydrochlorothiazide (HYDRODIURIL) 12.5 MG tablet Take 12.5 mg by mouth every morning.   Yes Historical Provider, MD  losartan (COZAAR) 100 MG tablet Take 100 mg by mouth every evening.    Yes Historical Provider, MD   Family History:  Family Status  Relation Status Death Age  . Mother Deceased   . Father Deceased 35    heart disease  . Son Alive   . Daughter Alive     Social History:   reports that she quit smoking about 2 years ago. Her smoking use included Cigarettes. She has a 30 pack-year smoking history. She has never used smokeless tobacco. She reports that she drinks alcohol. She reports that she does not use illicit drugs.   History   Social History Narrative   Patient is Widowed. Retired at 80 years old. Lives in apartment,  Independent Living section at [place] since 05/2013.   Stopped smoking 2012, moderate alcohol intake    Patient has Advanced planning documents: Living Will     Review of Systems:  She has significant arthritis. She has a cough and complains of seen urinary frequency. She is quite anxious  In a general sense. Other than as noted above, the remainder of the review of systems is normal  Physical Exam: BP 131/83  Pulse 74  Temp(Src) 98.2 F (36.8 C) (Oral)  Resp 16  SpO2 95% General appearance: Elderly pleasant somewhat anxious female in no acute distress Head: Normocephalic, without obvious abnormality, atraumatic Eyes: conjunctivae/corneas clear. PERRL, EOM's intact. Fundi not examined  Neck: no adenopathy, no carotid bruit, no JVD and supple, symmetrical, trachea midline Lungs: clear to auscultation bilaterally and Increased AP diameter Heart: regular rate and rhythm, S1, S2 normal, no murmur, click, rub or gallop Abdomen: soft, non-tender; bowel sounds  normal; no masses,  no organomegaly Pelvic: deferred Extremities: Trace edema, mild venous insufficiency changes seen Pulses: 2+ and symmetric Skin: Skin color, texture, turgor normal. No rashes or lesions Neurologic: Grossly normal  Labs: CBC  Recent Labs  05/07/14 2330 05/07/14 2341  WBC 7.1  --   RBC 3.97  --   HGB 13.0 14.6  HCT 39.0 43.0  PLT 244  --   MCV 98.2  --   MCH 32.7  --   MCHC 33.3  --   RDW 12.6  --    CMP   Recent Labs  05/07/14 2341  NA 136*  K 3.8  CL 99  GLUCOSE 106*  BUN 17  CREATININE 0.50   Cardiac Panel (last 3 results) Troponin (Point of Care Test)  Recent Labs  05/07/14 2340  TROPIPOC 0.01     EKG: Atrial fibrillation with  controlled response  Radiology: COPD, vascular calcification   IMPRESSIONS: 1. Paroxysmal atrial fibrillation 2. Hypertension 3. Coronary artery disease as manifested by three-vessel coronary calcification on CT scan 4. Aorticl aneurysm by CT scan 5. COPD currently not smoking  PLAN: She is now been to the emergency room twice in the past week. She has had paroxysmal atrial fibrillation noted on event monitoring Friday night. She will be admitted and started on anticoagulation. Likely need suppressive therapy as she has failed rate control. Because of her vascular calcification we'll try Tikosyn but will need to check magnesium and replete potassium prior to starting this.  Signed: Kerry Hough MD [facility]  05/08/2014, 12:03 AM

## 2014-05-09 ENCOUNTER — Inpatient Hospital Stay (HOSPITAL_COMMUNITY): Payer: Medicare Other

## 2014-05-09 LAB — BASIC METABOLIC PANEL
Anion gap: 12 (ref 5–15)
BUN: 16 mg/dL (ref 6–23)
CHLORIDE: 101 meq/L (ref 96–112)
CO2: 28 mEq/L (ref 19–32)
Calcium: 9.4 mg/dL (ref 8.4–10.5)
Creatinine, Ser: 0.55 mg/dL (ref 0.50–1.10)
GFR calc Af Amer: >90 mL/min (ref >90)
GFR calc non Af Amer: 86 mL/min — ABNORMAL LOW (ref >90)
GLUCOSE: 107 mg/dL — AB (ref 70–99)
Potassium: 4 mEq/L (ref 3.7–5.3)
Sodium: 141 mEq/L (ref 137–147)

## 2014-05-09 LAB — MAGNESIUM: Magnesium: 2.2 mg/dL (ref 1.5–2.5)

## 2014-05-09 MED ORDER — TECHNETIUM TC 99M SESTAMIBI - CARDIOLITE
30.0000 | Freq: Once | INTRAVENOUS | Status: AC | PRN
  Administered 2014-05-09: 30 via INTRAVENOUS

## 2014-05-09 MED ORDER — REGADENOSON 0.4 MG/5ML IV SOLN
INTRAVENOUS | Status: AC
  Administered 2014-05-09: 0.4 mg
  Filled 2014-05-09: qty 5

## 2014-05-09 MED ORDER — TECHNETIUM TC 99M SESTAMIBI GENERIC - CARDIOLITE
10.0000 | Freq: Once | INTRAVENOUS | Status: AC | PRN
  Administered 2014-05-09: 10 via INTRAVENOUS

## 2014-05-09 NOTE — Progress Notes (Signed)
Subjective:  She had a nuclear stress test earlier today that she tolerated well. The nurse called concerned about bradycardia. I have reviewed the strips and feel like they show blocked PACs. I have not seen any higher degrees of heart block. No chest pain. Encouraged to walk.  Objective:  Vital Signs in the last 24 hours: BP 119/71  Pulse 72  Temp(Src) 98 F (36.7 C) (Oral)  Resp 18  Ht 5\' 9"  (1.753 m)  Wt 84.324 kg (185 lb 14.4 oz)  BMI 27.44 kg/m2  SpO2 96%  Physical Exam: Doesn't female in no acute distress Lungs:  Clear  Cardiac:  Regular rhythm, normal S1 and S2, no S3 Abdomen:  Soft, nontender, no masses Extremities:  No edema present  Intake/Output from previous day: 10/05 0701 - 10/06 0700 In: 240 [P.O.:240] Out: -  Weight Filed Weights   05/08/14 0121  Weight: 84.324 kg (185 lb 14.4 oz)    Lab Results: Basic Metabolic Panel:  Recent Labs  05/08/14 0404 05/09/14 0349  NA 136* 141  K 4.3 4.0  CL 98 101  CO2 29 28  GLUCOSE 96 107*  BUN 15 16  CREATININE 0.58 0.55    CBC:  Recent Labs  05/07/14 2330 05/07/14 2341  WBC 7.1  --   HGB 13.0 14.6  HCT 39.0 43.0  MCV 98.2  --   PLT 244  --    Telemetry: Sinus with PACs, QTC 0.47. She has periods of bigeminal blocked PACs that are noted did appear to be bradycardic but which I think are just blocked PACs  Assessment/Plan:  1. Paroxysmal atrial fibrillation currently maintaining sinus rhythm on Tikosyn. Blocked PACs seem to explain the bradycardia. I do not think she is more danced heart block 2. Long-term anticoagulation with Eliquus tolerating this well 3. Coronary artery disease as manifested by coronary calcifications with nonischemic nuclear test 4. Hypertension controlled  Recommendations:  Rhythm strips reviewed. Results of stress test done earlier today reviewed. Hopefully discharge tomorrow afternoon if stable.     Kerry Hough  MD [facility]  05/09/2014, 8:55  PM

## 2014-05-09 NOTE — Progress Notes (Signed)
05/09/2014 10:14 AM Nursing note RN noted pt. HR dipping down into low-mid 40's. Upon assessment pt. Asleep. Asymptomatic. VSS. HR immediately returned to 58-62 SR upon awakening. Reviewed strip with CCMD monitoring technician. Questionable 2nd degree AVB type 2. Dr. Wynonia Lawman contacted and made aware. MD to review strip. Verbal order ok to still administer scheduled Cardizem PO as ordered. Order enacted and patient updated on plan of care. Will continue to closely monitor patient.  Nekita Pita, Arville Lime

## 2014-05-09 NOTE — Progress Notes (Signed)
05/09/2014 11:20 AM Nursing note QTC collected from telemetry monitor since pt. In nuclear med. QTC 500. [facility], Arville Lime

## 2014-05-09 NOTE — Progress Notes (Signed)
3 hours post Tikosyn administration, QTC was 469 ms. Will continue to monitor patient.

## 2014-05-09 NOTE — Progress Notes (Signed)
05/09/2014 1330 Nursing note Upon pt. Return from nuclear medicine pt. Hr noted to be dipping once again into the mid 30's and sustaining for approximately 30 seconds then returning to NSR in 70's. Upon assessment, pt. Awake and sitting comfortably in bed asymptomatic. VSS. CCMD recommending 2nd degree HB however not clear from monitor.  Questionable premature atrial contraction with compensatory pauses. Dr. Wynonia Lawman contacted and made aware. No new orders at this time. Will continue to monitor patient closely. 4:18 PM No further issues with bradycardia since prior event. Pt. Currently NSR 67. Will continue to monitor patient.  [name] [name], Arville Lime

## 2014-05-09 NOTE — Care Management Note (Addendum)
    Page 1 of 1   05/10/2014     2:20:40 PM CARE MANAGEMENT NOTE 05/10/2014  Patient:  TWANDA, STAKES   Account Number:  [id number]  Date Initiated:  05/09/2014  Documentation initiated by:  Creg Gilmer  Subjective/Objective Assessment:   Pt adm on 05/07/14 with Afib for Tikosyn loading.  PTA, pt independent of ADLS.     Action/Plan:   Will check insurance coverage for Tikosyn and ensure that pt can get Tikosyn on an outpt basis.   Anticipated DC Date:  05/10/2014   Anticipated DC Plan:  [place]  CM consult  Medication Assistance      Choice offered to / List presented to:             Status of service:  Completed, signed off Medicare Important Message given?  YES (If response is "NO", the following Medicare IM given date fields will be blank) Date Medicare IM given:  05/10/2014 Medicare IM given by:  Jennae Hakeem Date Additional Medicare IM given:   Additional Medicare IM given by:    Discharge Disposition:  HOME/SELF CARE  Per UR Regulation:  Reviewed for med. necessity/level of care/duration of stay  If discussed at [place] of Stay Meetings, dates discussed:    Comments:  05/10/14 Ellan Lambert, RN, BSN [phone]  PT [place] BE $40 -[place] NOT REQUIRED; PT COPAY FOR ELIQUIS  WILL BE $30- [place] [phone] Pt given 30 day free trial card for Eliquis for first Rx.  Rx for 7 day supply of Tikosyn sent down to be filled by main pharmacy.  Bedside nurse to be called when Rx ready to pick up.  05/09/14 Ellan Lambert, RN, BSN [phone] Pt's pharmacy, [facility] on [address], IS NOT on the [place] registry and will not be able to dispense Tikosyn.  Spoke with pt, and she states she would like to try [employer].  [facility] is on [address] and will be able to dispense med.  Pt will need an actual Rx at dc to take to [facility] upon dc, and an extra RX for one week's supply of med to be filled by main  pharmacy prior to dc. Thanks.

## 2014-05-10 ENCOUNTER — Encounter (HOSPITAL_COMMUNITY): Payer: Self-pay | Admitting: *Deleted

## 2014-05-10 DIAGNOSIS — Z7901 Long term (current) use of anticoagulants: Secondary | ICD-10-CM

## 2014-05-10 LAB — BASIC METABOLIC PANEL
ANION GAP: 14 (ref 5–15)
BUN: 20 mg/dL (ref 6–23)
CALCIUM: 9.1 mg/dL (ref 8.4–10.5)
CO2: 27 meq/L (ref 19–32)
CREATININE: 0.69 mg/dL (ref 0.50–1.10)
Chloride: 98 mEq/L (ref 96–112)
GFR calc Af Amer: >90 mL/min (ref >90)
GFR calc non Af Amer: 80 mL/min — ABNORMAL LOW (ref >90)
Glucose, Bld: 100 mg/dL — ABNORMAL HIGH (ref 70–99)
Potassium: 4.2 mEq/L (ref 3.7–5.3)
Sodium: 139 mEq/L (ref 137–147)

## 2014-05-10 LAB — MAGNESIUM: Magnesium: 2.2 mg/dL (ref 1.5–2.5)

## 2014-05-10 MED ORDER — APIXABAN 5 MG PO TABS: 5.0000 mg | ORAL_TABLET | Freq: Two times a day (BID) | ORAL | Status: DC

## 2014-05-10 MED ORDER — DOFETILIDE 250 MCG PO CAPS
250.0000 ug | ORAL_CAPSULE | Freq: Two times a day (BID) | ORAL | Status: AC
Start: 2014-05-10 — End: ?

## 2014-05-10 NOTE — Progress Notes (Signed)
Heart rate drops to high 30's and 40's but goes back up to normal sinus rhythm. MD on call notified.  Per MD, Dr. Wynonia Lawman is aware and it will be deferred to him. Patient is asymptomatic, will continue to monitor.

## 2014-05-10 NOTE — Progress Notes (Signed)
Pt discharge education and instructions completed with pt at bedside. Pt voices understanding and denies any question. Pt IV and telemetry removed; pt handed her prescription as well as a Tikosyn bottle picked up from pharmacy to pt. Pt advised to go to MD office to pick up samples of Eliquis as informed by MD. Pt transported off unit via wheelchair with belongings to side. Pt discharge home to independent part of [facility]. Pt daughter transported pt off to disposition. Francis Gaines Shatoya Roets RN.

## 2014-05-10 NOTE — Progress Notes (Signed)
EKG completed. Francis Gaines Janson Lamar RN.

## 2014-05-10 NOTE — Discharge Summary (Signed)
Physician Discharge Summary  Patient ID: [name] [name] MRN: [id number] DOB/AGE: August 22, 1930 80 y.o.  Admit date: 05/07/2014 Discharge date: 05/10/2014  Primary Physician:  Dr. Kelton Pillar  Primary Discharge Diagnosis:  1. Paroxysmal atrial fibrillation-symptomatic  Secondary Discharge Diagnosis: 2. Coronary atherosclerosis without angina 3. Ascending thoracic aortic aneurysm 4. COPD 5. Hypertension 6. Long-term use of anticoagulants  Procedures:  Echocardiogram, Lexiscan myocardial perfusion scan, CT scan of chest  Hospital Course: This 80 year old female recently had a colonoscopy and following this began to have episodic palpitations. Nothing much was seen from this but she continued to have palpitations and an event monitor was placed. This showed paroxysmal atrial fibrillation and she was started on diltiazem last Friday night. She presented to the emergency room with recurrent rapid atrial fibrillation and was given a bolus of intravenous diltiazem with resolution of her symptoms. Because of the rapid atrial fibrillation, she was admitted to the hospital for further control of the atrial fibrillation. Because of her age, hypertension and vascular disease she has a CHADS2VASC score of 5 and would need to have anticoagulation. She is highly symptomatic from the atrial fibrillation and it was felt that suppressive therapy would be necessary.  The patient in terms of her atrial fibrillation was placed on Eliquus and was started on dofetilide loading. She tolerated the fell on well without significant QT prolongation but continued to have frequent PACs. Some PACs or block leading to the appearance of what appears to be bradycardia but was just in fact blocked PACs. For the most part her atrial fibrillation was suppressed after starting dofetilide and continuing on diltiazem. Her HCTZ was discontinued.  In addition she had a CT scan noted that showed an ascending aortic aneurysm on a  previous visit. A repeat CT scan showed a slight increase in this aneurysm at 4.4 cm and this will continue to be followed as an outpatient. She also had significant three-vessel coronary atherosclerosis noted but was not symptomatic. A Lexiscan myocardial perfusion scan was done and showed no evidence of myocardial ischemia and an ejection fraction of 61%. Echocardiogram showed preserved LV function and only mild left atrial enlargement.  She is discharged home in improved condition will followup with me in one week. Discharge Exam: Blood pressure 121/84, pulse 105, temperature 97.6 F (36.4 C), temperature source Oral, resp. rate 19, height 5\' 9"  (1.753 m), weight 84.7 kg (186 lb 11.7 oz), SpO2 93.00%. Weight: 84.7 kg (186 lb 11.7 oz) Anxious but pleasant white female in no acute distress, lungs clear, no S3 or  Labs: CBC:   Lab Results  Component Value Date   WBC 7.1 05/07/2014   HGB 14.6 05/07/2014   HCT 43.0 05/07/2014   MCV 98.2 05/07/2014   PLT 244 05/07/2014    CMP:  Recent Labs Lab 05/08/14 0044  05/10/14 0307  NA 138  < > 139  K 4.0  < > 4.2  CL 97  < > 98  CO2 27  < > 27  BUN 17  < > 20  CREATININE 0.52  < > 0.69  CALCIUM 9.2  < > 9.1  PROT 7.7  --   --   BILITOT 0.3  --   --   ALKPHOS 60  --   --   ALT 16  --   --   AST 19  --   --   GLUCOSE 105*  < > 100*  < > = values in this interval not displayed.  Thyroid: Lab Results  Component Value Date   TSH 2.470 05/08/2014    Radiology: Stable cardiomegaly, no acute changes, atherosclerosis  EKG: Sinus rhythm with PACs, QTC is 0.71  Discharge Medications:   Medication List    STOP taking these medications       hydrochlorothiazide 12.5 MG tablet  Commonly known as:  HYDRODIURIL      TAKE these medications       acetaminophen 650 MG CR tablet  Commonly known as:  TYLENOL  Take 1,300 mg by mouth every 8 (eight) hours as needed for pain.     ALPRAZolam 0.25 MG tablet  Commonly known as:  XANAX  Take 1  tablet (0.25 mg total) by mouth every 6 (six) hours as needed for anxiety or sleep.     apixaban 5 MG Tabs tablet  Commonly known as:  ELIQUIS  Take 1 tablet (5 mg total) by mouth 2 (two) times daily.     BIOTIN PO  Take 1,000 mcg by mouth daily.     bismuth subsalicylate 629 MG chewable tablet  Commonly known as:  PEPTO BISMOL  Chew 524 mg by mouth as needed for indigestion.     budesonide-formoterol 160-4.5 MCG/ACT inhaler  Commonly known as:  SYMBICORT  Inhale 2 puffs into the lungs 2 (two) times daily.     diltiazem 120 MG 24 hr capsule  Commonly known as:  CARDIZEM CD  Take 120 mg by mouth 2 (two) times daily.     diphenhydramine-acetaminophen 25-500 MG Tabs  Commonly known as:  TYLENOL PM  Take 2 tablets by mouth at bedtime.     dofetilide 250 MCG capsule  Commonly known as:  TIKOSYN  Take 1 capsule (250 mcg total) by mouth 2 (two) times daily.     glucosamine-chondroitin 500-400 MG tablet  Take 1 tablet by mouth 2 (two) times daily.     losartan 100 MG tablet  Commonly known as:  COZAAR  Take 100 mg by mouth every evening.     Vitamin D3 1000 UNITS Caps  Take 1 tablet by mouth daily.       Followup plans and appointments: Followup Dr. Wynonia Lawman in one week  Time spent with patient to include physician time:  45 minutes  Signed: W. Doristine Church. MD [facility] 05/10/2014, 9:03 AM

## 2014-06-17 ENCOUNTER — Telehealth: Payer: Self-pay | Admitting: Physician Assistant

## 2014-06-17 NOTE — Telephone Encounter (Signed)
[name] [name] is a 80 y.o. female with hx of AFib.  She is on Tikosyn. She has been prescribed medications by PCP for respiratory illness (hycodan, prednisone, doxycycline). She was concerned about interactions with her medications and called the answering service. I advised her that Doxycycline was ok. Hycodan can cause QT prolongation with Tikosyn.  Prednisone can also cause this. I asked her to call her PCP for alternative drugs (instead of hycodan and prednisone). She may ask for Tessalon. She agreed with this plan. Signed,  Richardson Dopp, PA-C   06/17/2014 9:38 AM

## 2014-07-03 ENCOUNTER — Ambulatory Visit (INDEPENDENT_AMBULATORY_CARE_PROVIDER_SITE_OTHER): Payer: Medicare Other | Admitting: Internal Medicine

## 2014-07-03 ENCOUNTER — Encounter: Payer: Self-pay | Admitting: Internal Medicine

## 2014-07-03 VITALS — BP 140/90 | HR 82 | Temp 97.9°F | Ht 65.0 in | Wt 181.0 lb

## 2014-07-03 DIAGNOSIS — I712 Thoracic aortic aneurysm, without rupture: Secondary | ICD-10-CM

## 2014-07-03 DIAGNOSIS — I7121 Aneurysm of the ascending aorta, without rupture: Secondary | ICD-10-CM

## 2014-07-03 DIAGNOSIS — J449 Chronic obstructive pulmonary disease, unspecified: Secondary | ICD-10-CM

## 2014-07-03 MED ORDER — AMOXICILLIN-POT CLAVULANATE 875-125 MG PO TABS: 1.0000 | ORAL_TABLET | Freq: Two times a day (BID) | ORAL | Status: DC

## 2014-07-03 MED ORDER — METOPROLOL SUCCINATE ER 25 MG PO TB24: ORAL_TABLET | ORAL | Status: DC

## 2014-07-03 MED ORDER — PREDNISONE 10 MG PO TABS: ORAL_TABLET | ORAL | Status: DC

## 2014-07-03 NOTE — Progress Notes (Signed)
Subjective:    Patient ID: [name] [name], female    DOB: Jan 03, 1931   MRN: [id number]  Brief patient profile:  [id number] longtime retired Architect at [employer] quit smoking almost completely July 2012  With GOLD II copd by pft's 11/11/10   History of Present Illness  06/25/2012 f/u ov/Taraya Steward  Quit smoking 02/2011 cc indolent onset x 6 months progressively worse to point of doe x one flight of steps but no need to  stop at top, maintained on symbicort 2 bid not using albuterol at all.   rec tudorza is one puff twice daily> no better so d/c'd Only use your albuterol as a rescue medication    08/13/2013 f/u ov/Keshon Markovitz re: [place] II Chief Complaint  Patient presents with  . Followup with PFT    Pt states her breathing is overall doing well.     On symbicort 160 2bid and no need at all for saba  rec Your copd is mild and moderate and well controlled on symbicort 160 Take 2 puffs first thing in am and then another 2 puffs about 12 hours later.  So you are cleared for orthopedic surgery > L THR   07/03/2014 acute ov/Aislynn Cifelli re: aecopd p aburpt febrile illness   / maint rx with symbicort 160 2bid  Chief Complaint  Patient presents with  . Acute Visit    Pt states started feeling bad on 06/14/14- fever, chills, and cough with green sputum- was dxed with virus. She started to feel better, but as of a wk ago having more SOB.   doxy/ no prednisone > transiently some better, no more fever but beginning  to cough up green again x 2 days cxr at [facility] 2 weeks prior to OV  "ok"  Not using ventolin as rec (last used 06/27/14 and did not know she could use it up to 2 puffs q 4h prn) Cough some better with mucinex and tessalon   No obvious daytime variabilty or assoc  cp or chest tightness, subjective wheeze overt   hb symptoms. No unusual exp hx or h/o childhood pna/ asthma or premature birth to her  knowledge.   Sleeping ok without nocturnal  or early am exacerbation  of respiratory  c/o's  or need for noct saba. Also denies any obvious fluctuation of symptoms with weather or environmental changes or other aggravating or alleviating factors except as outlined above   ROS  The following are not active complaints unless bolded sore throat, dysphagia, dental problems, itching, sneezing,  nasal congestion or excess/ purulent secretions, ear ache,   fever, chills, sweats, unintended wt loss, pleuritic or exertional cp, hemoptysis,  orthopnea pnd or leg swelling, presyncope, palpitations, heartburn, abdominal pain, anorexia, nausea, vomiting, diarrhea  or change in bowel or urinary habits, change in stools or urine, dysuria,hematuria,  rash, arthralgias, visual complaints, headache, numbness weakness or ataxia or problems with walking or coordination,  change in mood/affect or memory.          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  Oropharynx no thrush or excess pnd or cobblestoning.  No JVD or cervical adenopathy. Mild accessory muscle hypertrophy. Trachea midline, nl thryroid. Chest was  hyperinflated by percussion with diminished breath sounds and moderate increased exp time without wheeze. Hoover sign positive at mid inspiration. Regular rate and rhythm without murmur gallop or rub or increase P2 or edema.  Abd: no hsm, nl excursion. Ext warm without cyanosis or clubbing.       05/30/13 CT w/contrast 1. 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 The  extent of infection is overall similar to 06/04/2005, with some  areas progressive and some regressive. An area within the posterior  left upper lobe for which acute or subacute disease cannot be  excluded, given clinical symptoms.  2. No evidence of pulmonary nodule or mass. The plain film  abnormality was secondary to lingular volume loss and  bronchiectasis.  3. Development of mild ascending aortic aneurysm.  4. Coronary artery atherosclerosis.       Assessment & Plan:

## 2014-07-03 NOTE — Patient Instructions (Addendum)
Mucinex up to 1200 mg every 12 hours as needed for cough/congestion  Tessalon  Is good for raspy throat irritation type of cough and no reason you can't take both   Augmentin 875 mg take one pill twice daily  X 10 days - take at breakfast and supper with large glass of water.  It would help reduce the usual side effects (diarrhea and yeast infections) if you ate cultured yogurt at lunch.   If not getting better, ok to take Prednisone 10 mg take  4 each am x 2 days,   2 each am x 2 days,  1 each am x 2 days and stop   Your blood pressure is too high given the fact you have an aneurysm and my recommendation is bisoprolol best choice over lopressor but for now ok to take the lopressor twice daily   Remember you can use your ventolin up to 2 puffs every 4 hours if necessary

## 2014-07-03 NOTE — Assessment & Plan Note (Signed)
Noted on CT scan 05/30/13    Needs tighter bp control and for now ok to increase lopressor to 25 mg bid but if higher doses required  Strongly prefer in this setting: Bystolic, the most beta -1  selective Beta blocker available in sample form, with bisoprolol the most selective generic choice  on the market.   See instructions for specific recommendations which were reviewed directly with the patient who was given a copy with highlighter outlining the key components.

## 2014-07-03 NOTE — Assessment & Plan Note (Addendum)
Quit smoking  02/2011  - PFT's 09/15/2005 FEV1 1.69 (76%) with ratio 67 and better x 14% p B2, DLC 95%  - PFT's 11/11/2010 FEV1 1.32 (63%) with ratio 62 and no better p B2, DLC0 66%  - PFT's 08/25/2012 FEV1 1.56 (76%) with ratio 66 and no better p B2, DLCO 88% - PFTs 08/12/2013 FEV1   1.37 (64%) ratio 64 with no better B2, DLCO 59 corrects 77% p am symbicort  - Add tudorza 06/25/2012 > no better so d/cd  She's actually doing pretty well s typical aecopd features but does report increasing purulent sputum p first responding to doxy for what was likley initially a viral or atypical infection so now rec rx augmentin x 10 days as on tikosyn and don't want to risk any FQ interactions    Each maintenance medication was reviewed in detail including most importantly the difference between maintenance and as needed and under what circumstances the prns are to be used.  Please see instructions for details which were reviewed in writing and the patient given a copy.   The proper method of use, as well as anticipated side effects, of a metered-dose inhaler are discussed and demonstrated to the patient. Improved effectiveness after extensive coaching during this visit to a level of approximately  75% so continue symbicort 160 2bid and prn saba reviewed.

## 2014-07-18 ENCOUNTER — Observation Stay (HOSPITAL_COMMUNITY)
Admission: EM | Admit: 2014-07-18 | Discharge: 2014-07-19 | Disposition: A | Discharge status: Home / Self Care | Payer: Medicare Other | Attending: Internal Medicine | Admitting: Internal Medicine

## 2014-07-18 ENCOUNTER — Encounter (HOSPITAL_COMMUNITY): Payer: Self-pay | Admitting: Emergency Medicine

## 2014-07-18 ENCOUNTER — Emergency Department (HOSPITAL_COMMUNITY): Payer: Medicare Other

## 2014-07-18 ENCOUNTER — Telehealth: Payer: Self-pay | Admitting: Cardiology

## 2014-07-18 DIAGNOSIS — Z888 Allergy status to other drugs, medicaments and biological substances status: Secondary | ICD-10-CM | POA: Diagnosis not present

## 2014-07-18 DIAGNOSIS — J449 Chronic obstructive pulmonary disease, unspecified: Secondary | ICD-10-CM | POA: Diagnosis present

## 2014-07-18 DIAGNOSIS — F419 Anxiety disorder, unspecified: Secondary | ICD-10-CM | POA: Diagnosis not present

## 2014-07-18 DIAGNOSIS — Z885 Allergy status to narcotic agent status: Secondary | ICD-10-CM | POA: Diagnosis not present

## 2014-07-18 DIAGNOSIS — R5383 Other fatigue: Secondary | ICD-10-CM

## 2014-07-18 DIAGNOSIS — I480 Paroxysmal atrial fibrillation: Secondary | ICD-10-CM | POA: Diagnosis present

## 2014-07-18 DIAGNOSIS — I7121 Aneurysm of the ascending aorta, without rupture: Secondary | ICD-10-CM | POA: Diagnosis present

## 2014-07-18 DIAGNOSIS — I2510 Atherosclerotic heart disease of native coronary artery without angina pectoris: Secondary | ICD-10-CM | POA: Diagnosis not present

## 2014-07-18 DIAGNOSIS — I517 Cardiomegaly: Secondary | ICD-10-CM | POA: Insufficient documentation

## 2014-07-18 DIAGNOSIS — I10 Essential (primary) hypertension: Secondary | ICD-10-CM | POA: Diagnosis present

## 2014-07-18 DIAGNOSIS — Z7901 Long term (current) use of anticoagulants: Secondary | ICD-10-CM

## 2014-07-18 DIAGNOSIS — R42 Dizziness and giddiness: Principal | ICD-10-CM

## 2014-07-18 DIAGNOSIS — I712 Thoracic aortic aneurysm, without rupture: Secondary | ICD-10-CM | POA: Diagnosis not present

## 2014-07-18 DIAGNOSIS — R55 Syncope and collapse: Secondary | ICD-10-CM | POA: Diagnosis present

## 2014-07-18 DIAGNOSIS — G319 Degenerative disease of nervous system, unspecified: Secondary | ICD-10-CM | POA: Insufficient documentation

## 2014-07-18 MED ORDER — TETRACAINE HCL 0.5 % OP SOLN: 2.0000 [drp] | Freq: Once | OPHTHALMIC | Status: DC

## 2014-07-18 MED ORDER — SODIUM CHLORIDE 0.9 % IV BOLUS (SEPSIS)
500.0000 mL | Freq: Once | INTRAVENOUS | Status: AC
  Administered 2014-07-18: 500 mL via INTRAVENOUS

## 2014-07-18 NOTE — ED Provider Notes (Signed)
CSN: [id number]     Arrival date & time 07/18/14  2222 History   First MD Initiated Contact with Patient 07/18/14 2234     Chief Complaint  Patient presents with  . Hypertension  . Dizziness     (Consider location/radiation/quality/duration/timing/severity/associated sxs/prior Treatment) The history is provided by the patient and medical records.     Pt with hx afib, HTN, COPD, ascending thoracic aortic aneurysm, on Eliquis presents with generalized malaise for several weeks, worse today, episode of near syncope at noon with increased lightheadedness following the event that lasted approximately 15 minutes.  Feels lightheaded with standing. Also feels a heaviness in the back of her head.   Around 4pm she felt worse and began checking her blood pressure, found it to be 157/102, it continued to increase to 204/121.  Called Dr Thurman Coyer office, spoke with Dr Elias Else who recommended she see [facility] independent living RN who recommended she come to ED.  States she did have respiratory infection a few weeks ago that resolved but she never gained her energy back.  Does admit to urinary frequency, having to urinate every 30 minutes.  Also has has chronic SOB that is worse than usual, has had DOE for the past 6 months. Denies CP, HA, dizziness (spinning), syncope or fall.    Cardiologist: Dr Wynonia Lawman Pulmonologist: Dr Melvyn Novas PCP: Dr Laurann Montana  Past Medical History  Diagnosis Date  . COPD (chronic obstructive pulmonary disease)     pft's 09/15/2005, FEV1 1.69 (76%) with ration67 and better x 14% p B2, DLC 95%  . Hypertension   . Bronchitis, chronic   . Celiac disease   . Anxiety   . Pneumonia     hx of x3  . Acute blood loss anemia 09/07/13  . Anxiety state, unspecified   . Bronchiectasis 2012  . Cerebrovascular disease, unspecified 05/26/13  . Cerebral atrophy 05/26/13   Past Surgical History  Procedure Laterality Date  . Nasal sinus surgery  years ago  . Total knee arthroplasty Bilateral  [id number]  . Excision morton's neuroma Bilateral 35 years ago, 2010  . Total hip arthroplasty Right 2009    Dr. Telford Nab  . Cataract extraction Bilateral ~8 years ago  . Total hip arthroplasty Left 09/06/2013    Dr. Salli Quarry   Family History  Problem Relation Age of Onset  . Heart disease Father 43    heart attack  . Prostate cancer Paternal Grandfather   . Pulmonary fibrosis Brother     former smoker   History  Substance Use Topics  . Smoking status: Former Smoker -- 0.50 packs/day for 60 years    Types: Cigarettes    Quit date: 01/27/2012  . Smokeless tobacco: Never Used  . Alcohol Use: Yes     Comment: Wine and Gin daily   OB History    No data available     Review of Systems  All other systems reviewed and are negative.     Allergies  Benicar; Metronidazole; and Morphine  Home Medications   Prior to Admission medications   Medication Sig Start Date End Date Taking? Authorizing Provider  acetaminophen (TYLENOL) 650 MG CR tablet Take 1,300 mg by mouth every 8 (eight) hours as needed for pain.    Historical Provider, MD  ALPRAZolam Duanne Moron) 0.25 MG tablet Take 1 tablet (0.25 mg total) by mouth every 6 (six) hours as needed for anxiety or sleep. 09/08/13   Lucille Passy Babish, PA-C  amoxicillin-clavulanate (AUGMENTIN) 875-125 MG per tablet Take 1  tablet by mouth 2 (two) times daily. 07/03/14   Tanda Rockers, MD  apixaban (ELIQUIS) 5 MG TABS tablet Take 1 tablet (5 mg total) by mouth 2 (two) times daily. 05/10/14   Jacolyn Reedy, MD  benzonatate (TESSALON) 200 MG capsule Take 200 mg by mouth 3 (three) times daily as needed for cough.    Historical Provider, MD  BIOTIN PO Take 1,000 mcg by mouth daily.    Historical Provider, MD  budesonide-formoterol (SYMBICORT) 160-4.5 MCG/ACT inhaler Inhale 2 puffs into the lungs 2 (two) times daily.      Historical Provider, MD  Cholecalciferol (VITAMIN D3) 1000 UNITS CAPS Take 1 tablet by mouth daily.      Historical Provider, MD   diltiazem (CARDIZEM CD) 120 MG 24 hr capsule Take 120 mg by mouth 2 (two) times daily. 05/05/14   Historical Provider, MD  diphenhydramine-acetaminophen (TYLENOL PM) 25-500 MG TABS Take 2 tablets by mouth at bedtime.    Historical Provider, MD  dofetilide (TIKOSYN) 250 MCG capsule Take 1 capsule (250 mcg total) by mouth 2 (two) times daily. 05/10/14   Jacolyn Reedy, MD  glucosamine-chondroitin 500-400 MG tablet Take 1 tablet by mouth 2 (two) times daily.      Historical Provider, MD  Guaifenesin [facility] MAXIMUM STRENGTH) 1200 MG TB12 Take 1 tablet by mouth 2 (two) times daily.    Historical Provider, MD  losartan (COZAAR) 100 MG tablet Take 100 mg by mouth every evening.     Historical Provider, MD  metoprolol succinate (TOPROL-XL) 25 MG 24 hr tablet One twice daily 07/03/14   Tanda Rockers, MD  predniSONE (DELTASONE) 10 MG tablet Take  4 each am x 2 days,   2 each am x 2 days,  1 each am x 2 days and stop 07/03/14   Tanda Rockers, MD   BP 159/96 mmHg  Pulse 80  Temp(Src) 98.7 F (37.1 C) (Oral)  Resp 18  SpO2 96% Physical Exam  Constitutional: She appears well-developed and well-nourished. No distress.  HENT:  Head: Normocephalic and atraumatic.  Mouth/Throat: Oropharynx is clear and moist.  Eyes: Conjunctivae are normal.  Neck: Normal range of motion. Neck supple.  Cardiovascular: Normal rate, regular rhythm and intact distal pulses.   Pulmonary/Chest: Effort normal and breath sounds normal. No respiratory distress. She has no wheezes. She has no rales.  Abdominal: Soft. She exhibits no distension. There is no tenderness. There is no rebound and no guarding.  Musculoskeletal: She exhibits no edema.  Lymphadenopathy:    She has no cervical adenopathy.  Neurological: She is alert. She has normal strength. She is not disoriented. No cranial nerve deficit or sensory deficit. She exhibits normal muscle tone. GCS eye subscore is 4. GCS verbal subscore is 5. GCS motor subscore is 6.  CN  II-XII intact, EOMs intact, no pronator drift, grip strengths equal bilaterally; strength 5/5 in all extremities, sensation intact in all extremities; finger to nose, heel to shin, rapid alternating movements normal.     Skin: She is not diaphoretic.  Psychiatric: She has a normal mood and affect. Her behavior is normal. Thought content normal.  Nursing note and vitals reviewed.   ED Course  Procedures (including critical care time) Labs Review Labs Reviewed  COMPREHENSIVE METABOLIC PANEL - Abnormal; Notable for the following:    Glucose, Bld 105 (*)    Albumin 3.4 (*)    GFR calc non Af Amer 88 (*)    Anion gap 16 (*)  All other components within normal limits  CBC WITH DIFFERENTIAL - Abnormal; Notable for the following:    RBC 3.53 (*)    Hemoglobin 11.0 (*)    HCT 34.6 (*)    All other components within normal limits  URINALYSIS, ROUTINE W REFLEX MICROSCOPIC - Abnormal; Notable for the following:    Hgb urine dipstick SMALL (*)    Ketones, ur 15 (*)    All other components within normal limits  URINE MICROSCOPIC-ADD ON - Abnormal; Notable for the following:    Squamous Epithelial / LPF FEW (*)    All other components within normal limits  URINE CULTURE  TROPONIN I  PRO B NATRIURETIC PEPTIDE    Imaging Review Dg Chest 2 View  07/19/2014   CLINICAL DATA:  Initial evaluation for fatigue.  EXAM: CHEST  2 VIEW  COMPARISON:  Prior study from 05/07/2014.  FINDINGS: Cardiomegaly is stable from prior. Atherosclerotic calcifications present within the aortic arch. Mediastinal silhouette within normal limits.  Changes compatible with underlying COPD present. Curvilinear opacity within the lingula is stable from prior, likely atelectasis and/ or scar. No focal infiltrate, pulmonary edema, or pleural effusion. No pneumothorax. Irregular biapical pleural thickening noted.  No acute osseus abnormality. Sclerotic lesion within the proximal left humerus is stable.  IMPRESSION: 1. No active  cardiopulmonary disease. 2. COPD. 3. Stable scarring/atelectasis within the lingula.   Electronically Signed   By: Jeannine Boga M.D.   On: 07/19/2014 00:50     EKG Interpretation   Date/Time:  Tuesday July 18 2014 22:30:52 EST Ventricular Rate:  82 PR Interval:  175 QRS Duration: 95 QT Interval:  413 QTC Calculation: 482 R Axis:   -153 Text Interpretation:  Right and left arm electrode reversal,  interpretation assumes no reversal Sinus rhythm Right axis deviation  Abnormal T, consider ischemia, lateral leads that is new compared to prior  EKG Confirmed by WARD,  DO, KRISTEN ([id number]) on 07/18/2014 10:37:23 PM       11:07 PM Discussed pt with Dr Leonides Schanz who will also see patient.  Reviewed EKG with Dr Leonides Schanz- EKG shows new T wave changes in I and AVL.      Orthostatics performed by Marylin Lathon  Lying HR 77 BP 159/81 Sitting HR 85 BP 188/102 Standing HR99 BP 177/111   1:25 AM I spoke with Dr Posey Pronto who accepts patient for admission.  He would like Head CT to be completed before he puts in the bed request.  He will see her.   MDM   Final diagnoses:  Fatigue  Essential hypertension  Near syncope    Afebrile, nontoxic patient with generalized fatigue, episode of near syncope, lightheadedness, "heaviness in the back of her head, and urinary frequency.  Found to have new EKG changes, new T wave inversions.  Labs significant for mild anemia. UA shows ketones without e/o infection.  Troponin and BNP negative.  CXR negative. With orthostatic testing HR increased 20 bpm and pt becomes more hypertensive. Patient admitted to [facility], Dr Posey Pronto.  CT head pending at time of admission though pt is neurologically intact and I think there is a low likelihood of abnormality.    Clayton Bibles, PA-C 07/19/14 [phone]

## 2014-07-18 NOTE — Telephone Encounter (Signed)
Pt of Dr. Thurman Coyer with paroxysmal afib, HTN calling due to elevated BP. Normally, 120s/70s, but today, vague feeling of not well and checked BP 157/115 at 4 pm. Called Dr. Thurman Coyer office and took PM meds (losartan and metoprolol) early but BP remains elevated. Most recent 204/154, pulse of 89. Upper arm cuff. Repeated on R arm, 170s/120s.  Lives in independent living facility with available nurse- recommended she check it (?equipment malfunction) but otherwise, due to hypertensive urgency, ER visit is warranted.  FYI to Dr. Wynonia Lawman.

## 2014-07-18 NOTE — ED Notes (Signed)
Pt presents to ED via EMS with c/o head pressure, HTN, and slight dizziness, onset at noon today. Pt reports BP-204/121. Hx of a-fib and aortic aneurysm. Per EMS, O2-90% on room, CBG-84. Pt alerts and oriented x4 at this time, airway intact.

## 2014-07-18 NOTE — ED Notes (Signed)
Patient transported to X-ray 

## 2014-07-19 ENCOUNTER — Emergency Department (HOSPITAL_COMMUNITY): Payer: Medicare Other

## 2014-07-19 DIAGNOSIS — I480 Paroxysmal atrial fibrillation: Secondary | ICD-10-CM | POA: Diagnosis not present

## 2014-07-19 DIAGNOSIS — R42 Dizziness and giddiness: Secondary | ICD-10-CM | POA: Diagnosis not present

## 2014-07-19 DIAGNOSIS — I712 Thoracic aortic aneurysm, without rupture: Secondary | ICD-10-CM

## 2014-07-19 DIAGNOSIS — I2510 Atherosclerotic heart disease of native coronary artery without angina pectoris: Secondary | ICD-10-CM | POA: Diagnosis not present

## 2014-07-19 DIAGNOSIS — J449 Chronic obstructive pulmonary disease, unspecified: Secondary | ICD-10-CM

## 2014-07-19 DIAGNOSIS — I10 Essential (primary) hypertension: Secondary | ICD-10-CM

## 2014-07-19 DIAGNOSIS — Z7901 Long term (current) use of anticoagulants: Secondary | ICD-10-CM

## 2014-07-19 LAB — COMPREHENSIVE METABOLIC PANEL
ALBUMIN: 3.3 g/dL — AB (ref 3.5–5.2)
ALBUMIN: 3.4 g/dL — AB (ref 3.5–5.2)
ALT: 11 U/L (ref 0–35)
ALT: 11 U/L (ref 0–35)
ANION GAP: 12 (ref 5–15)
AST: 19 U/L (ref 0–37)
AST: 19 U/L (ref 0–37)
Alkaline Phosphatase: 54 U/L (ref 39–117)
Alkaline Phosphatase: 57 U/L (ref 39–117)
Anion gap: 16 — ABNORMAL HIGH (ref 5–15)
BUN: 11 mg/dL (ref 6–23)
BUN: 13 mg/dL (ref 6–23)
CHLORIDE: 97 meq/L (ref 96–112)
CHLORIDE: 98 meq/L (ref 96–112)
CO2: 24 mEq/L (ref 19–32)
CO2: 27 meq/L (ref 19–32)
CREATININE: 0.47 mg/dL — AB (ref 0.50–1.10)
CREATININE: 0.5 mg/dL (ref 0.50–1.10)
Calcium: 8.9 mg/dL (ref 8.4–10.5)
Calcium: 9 mg/dL (ref 8.4–10.5)
GFR calc Af Amer: >90 mL/min (ref >90)
GFR calc Af Amer: >90 mL/min (ref >90)
GFR calc non Af Amer: 88 mL/min — ABNORMAL LOW (ref >90)
Glucose, Bld: 105 mg/dL — ABNORMAL HIGH (ref 70–99)
Glucose, Bld: 111 mg/dL — ABNORMAL HIGH (ref 70–99)
Potassium: 3.8 mEq/L (ref 3.7–5.3)
Potassium: 3.9 mEq/L (ref 3.7–5.3)
Sodium: 136 mEq/L — ABNORMAL LOW (ref 137–147)
Sodium: 138 mEq/L (ref 137–147)
Total Bilirubin: 0.4 mg/dL (ref 0.3–1.2)
Total Bilirubin: 0.5 mg/dL (ref 0.3–1.2)
Total Protein: 6.7 g/dL (ref 6.0–8.3)
Total Protein: 6.8 g/dL (ref 6.0–8.3)

## 2014-07-19 LAB — CBC WITH DIFFERENTIAL/PLATELET
Basophils Absolute: 0 10*3/uL (ref 0.0–0.1)
Basophils Absolute: 0 10*3/uL (ref 0.0–0.1)
Basophils Relative: 0 % (ref 0–1)
Basophils Relative: 0 % (ref 0–1)
EOS PCT: 1 % (ref 0–5)
Eosinophils Absolute: 0.1 10*3/uL (ref 0.0–0.7)
Eosinophils Absolute: 0.1 10*3/uL (ref 0.0–0.7)
Eosinophils Relative: 1 % (ref 0–5)
HEMATOCRIT: 33.7 % — AB (ref 36.0–46.0)
HEMATOCRIT: 34.6 % — AB (ref 36.0–46.0)
HEMOGLOBIN: 10.6 g/dL — AB (ref 12.0–15.0)
HEMOGLOBIN: 11 g/dL — AB (ref 12.0–15.0)
LYMPHS ABS: 1.4 10*3/uL (ref 0.7–4.0)
LYMPHS ABS: 1.3 10*3/uL (ref 0.7–4.0)
LYMPHS PCT: 16 % (ref 12–46)
LYMPHS PCT: 14 % (ref 12–46)
MCH: 31 pg (ref 26.0–34.0)
MCH: 31.2 pg (ref 26.0–34.0)
MCHC: 31.5 g/dL (ref 30.0–36.0)
MCHC: 31.8 g/dL (ref 30.0–36.0)
MCV: 98.5 fL (ref 78.0–100.0)
MCV: 98 fL (ref 78.0–100.0)
MONO ABS: 0.6 10*3/uL (ref 0.1–1.0)
MONO ABS: 0.8 10*3/uL (ref 0.1–1.0)
MONOS PCT: 7 % (ref 3–12)
MONOS PCT: 8 % (ref 3–12)
Neutro Abs: 6.4 10*3/uL (ref 1.7–7.7)
Neutro Abs: 7.4 10*3/uL (ref 1.7–7.7)
Neutrophils Relative %: 76 % (ref 43–77)
Neutrophils Relative %: 77 % (ref 43–77)
Platelets: 200 10*3/uL (ref 150–400)
Platelets: 210 10*3/uL (ref 150–400)
RBC: 3.42 MIL/uL — AB (ref 3.87–5.11)
RBC: 3.53 MIL/uL — AB (ref 3.87–5.11)
RDW: 14.1 % (ref 11.5–15.5)
RDW: 14.2 % (ref 11.5–15.5)
WBC: 8.4 10*3/uL (ref 4.0–10.5)
WBC: 9.5 10*3/uL (ref 4.0–10.5)

## 2014-07-19 LAB — URINALYSIS, ROUTINE W REFLEX MICROSCOPIC
BILIRUBIN URINE: NEGATIVE
Glucose, UA: NEGATIVE mg/dL
KETONES UR: 15 mg/dL — AB
Leukocytes, UA: NEGATIVE
Nitrite: NEGATIVE
PH: 7.5 (ref 5.0–8.0)
Protein, ur: NEGATIVE mg/dL
Specific Gravity, Urine: 1.009 (ref 1.005–1.030)
Urobilinogen, UA: 0.2 mg/dL (ref 0.0–1.0)

## 2014-07-19 LAB — TROPONIN I
Troponin I: <0.3 ng/mL (ref <0.30)
Troponin I: <0.3 ng/mL (ref <0.30)

## 2014-07-19 LAB — URINE MICROSCOPIC-ADD ON

## 2014-07-19 LAB — MRSA PCR SCREENING: MRSA BY PCR: NEGATIVE

## 2014-07-19 LAB — PHOSPHORUS: PHOSPHORUS: 3 mg/dL (ref 2.3–4.6)

## 2014-07-19 LAB — TSH: TSH: 2.18 u[IU]/mL (ref 0.350–4.500)

## 2014-07-19 LAB — PRO B NATRIURETIC PEPTIDE: Pro B Natriuretic peptide (BNP): 386.2 pg/mL (ref 0–450)

## 2014-07-19 MED ORDER — ONDANSETRON HCL 4 MG/2ML IJ SOLN: 4.0000 mg | Freq: Four times a day (QID) | INTRAMUSCULAR | Status: DC | PRN

## 2014-07-19 MED ORDER — ONDANSETRON HCL 4 MG PO TABS: 4.0000 mg | ORAL_TABLET | Freq: Four times a day (QID) | ORAL | Status: DC | PRN

## 2014-07-19 MED ORDER — LOSARTAN POTASSIUM 50 MG PO TABS
100.0000 mg | ORAL_TABLET | Freq: Every evening | ORAL | Status: DC
  Filled 2014-07-19: qty 2

## 2014-07-19 MED ORDER — ACETAMINOPHEN 650 MG RE SUPP: 650.0000 mg | Freq: Four times a day (QID) | RECTAL | Status: DC | PRN

## 2014-07-19 MED ORDER — APIXABAN 5 MG PO TABS
5.0000 mg | ORAL_TABLET | Freq: Two times a day (BID) | ORAL | Status: DC
  Administered 2014-07-19: 5 mg via ORAL
  Filled 2014-07-19 (×2): qty 1

## 2014-07-19 MED ORDER — SODIUM CHLORIDE 0.9 % IV BOLUS (SEPSIS)
500.0000 mL | Freq: Once | INTRAVENOUS | Status: AC
  Administered 2014-07-19: 500 mL via INTRAVENOUS

## 2014-07-19 MED ORDER — SPIRONOLACTONE 25 MG PO TABS
25.0000 mg | ORAL_TABLET | Freq: Every day | ORAL | Status: DC
  Filled 2014-07-19 (×2): qty 1

## 2014-07-19 MED ORDER — ALBUTEROL SULFATE (2.5 MG/3ML) 0.083% IN NEBU: 3.0000 mL | INHALATION_SOLUTION | Freq: Four times a day (QID) | RESPIRATORY_TRACT | Status: DC | PRN

## 2014-07-19 MED ORDER — SODIUM CHLORIDE 0.9 % IJ SOLN
3.0000 mL | Freq: Two times a day (BID) | INTRAMUSCULAR | Status: DC
  Administered 2014-07-19: 3 mL via INTRAVENOUS

## 2014-07-19 MED ORDER — BUDESONIDE-FORMOTEROL FUMARATE 160-4.5 MCG/ACT IN AERO
2.0000 | INHALATION_SPRAY | Freq: Two times a day (BID) | RESPIRATORY_TRACT | Status: DC
  Administered 2014-07-19: 2 via RESPIRATORY_TRACT
  Filled 2014-07-19: qty 6

## 2014-07-19 MED ORDER — SPIRONOLACTONE 25 MG PO TABS: 25.0000 mg | ORAL_TABLET | Freq: Every day | ORAL | Status: DC

## 2014-07-19 MED ORDER — METOPROLOL SUCCINATE ER 50 MG PO TB24
50.0000 mg | ORAL_TABLET | Freq: Every day | ORAL | Status: DC
  Filled 2014-07-19: qty 1

## 2014-07-19 MED ORDER — DOFETILIDE 250 MCG PO CAPS
250.0000 ug | ORAL_CAPSULE | Freq: Two times a day (BID) | ORAL | Status: DC
  Administered 2014-07-19: 250 ug via ORAL
  Filled 2014-07-19 (×3): qty 1

## 2014-07-19 MED ORDER — DILTIAZEM HCL ER COATED BEADS 120 MG PO CP24
120.0000 mg | ORAL_CAPSULE | Freq: Every day | ORAL | Status: DC
  Administered 2014-07-19: 120 mg via ORAL
  Filled 2014-07-19: qty 1

## 2014-07-19 MED ORDER — ALPRAZOLAM 0.25 MG PO TABS: 0.1250 mg | ORAL_TABLET | Freq: Four times a day (QID) | ORAL | Status: DC | PRN

## 2014-07-19 MED ORDER — ACETAMINOPHEN 325 MG PO TABS
650.0000 mg | ORAL_TABLET | Freq: Four times a day (QID) | ORAL | Status: DC | PRN
  Administered 2014-07-19: 650 mg via ORAL
  Filled 2014-07-19: qty 2

## 2014-07-19 NOTE — Progress Notes (Signed)
UR completed 

## 2014-07-19 NOTE — ED Provider Notes (Signed)
Medical screening examination/treatment/procedure(s) were conducted as a shared visit with non-physician practitioner(s) and myself.  I personally evaluated the patient during the encounter.   EKG Interpretation   Date/Time:  Tuesday July 18 2014 22:30:52 EST Ventricular Rate:  82 PR Interval:  175 QRS Duration: 95 QT Interval:  413 QTC Calculation: 482 R Axis:   -153 Text Interpretation:  Right and left arm electrode reversal,  interpretation assumes no reversal Sinus rhythm Right axis deviation  Abnormal T, consider ischemia, lateral leads that is new compared to prior  EKG Confirmed by Rachid Parham,  DO, Sadiya Durand ([id number]) on 07/18/2014 10:37:23 PM      Pt is a 80 y.o. F with history of hypertension, prior history of tobacco use, COPD, atrial fibrillation who presents the emergency department with generalized weakness, fatigue for the past month, Percocet as shortness of breath with exertion for the past 6 months and feeling lightheaded with a near syncopal event today. EKG shows new lateral T-wave inversions compared to old EKGs. Patient denies any chest pain or chest discomfort. Hemodynamically stable on exam. In normal sinus rhythm. Heart and lung sounds normal. Neurologically intact.  Chest x-ray clear. Troponin negative. Labs unremarkable. We'll admit.  [place], DO 07/19/14 [phone]

## 2014-07-19 NOTE — H&P (Signed)
[facility] History and Physical  Patient: [name] [name]  FXO:[id number]  DOB: 09/03/1930  DOS: the patient was seen and examined on 07/19/2014 PCP: Osborne Casco, MD  Chief Complaint: High blood pressure  HPI: [name] [name] is a 80 y.o. female with Past medical history of hypertension, COPD, anxiety, CVA, A. fib, on Eliquis. The patient presented with complaints of generalized fatigue as well as dizziness. She mentions that she has been having increased shortness of breath that has been ongoing since last 6 months and has been having more tiredness over last week. Since last to 3 days she has been measuring her blood pressure and it has been persistently elevated. Today while she was checking her blood pressure and when she was repeating it every hour to has been persistently growing up with last blood pressure reading off 204/100 something. Along with that he also had some dizziness and lightheadedness and therefore she decided to come to the hospital. She denies any fever or chills denies any nausea or vomiting denies any diarrhea or constipation denies any active bleeding denies any burning urination or denies any other focal deficit. She mentions she is compliant with all her medications.  The patient is coming from home. And at her baseline independent for most of her ADL.  Review of Systems: as mentioned in the history of present illness.  A Comprehensive review of the other systems is negative.  Past Medical History  Diagnosis Date  . COPD (chronic obstructive pulmonary disease)     pft's 09/15/2005, FEV1 1.69 (76%) with ration67 and better x 14% p B2, DLC 95%  . Hypertension   . Bronchitis, chronic   . Celiac disease   . Anxiety   . Pneumonia     hx of x3  . Acute blood loss anemia 09/07/13  . Anxiety state, unspecified   . Bronchiectasis 2012  . Cerebrovascular disease, unspecified 05/26/13  . Cerebral atrophy 05/26/13   Past Surgical History  Procedure  Laterality Date  . Nasal sinus surgery  years ago  . Total knee arthroplasty Bilateral [id number]  . Excision morton's neuroma Bilateral 35 years ago, 2010  . Total hip arthroplasty Right 2009    Dr. Telford Nab  . Cataract extraction Bilateral ~8 years ago  . Total hip arthroplasty Left 09/06/2013    Dr. Salli Quarry   Social History:  reports that she quit smoking about 2 years ago. Her smoking use included Cigarettes. She has a 30 pack-year smoking history. She has never used smokeless tobacco. She reports that she drinks alcohol. She reports that she does not use illicit drugs.  Allergies  Allergen Reactions  . Benicar [Olmesartan] Diarrhea and Other (See Comments)    Hypotension  . Metronidazole Nausea Only  . Morphine     REACTION: hives    Family History  Problem Relation Age of Onset  . Heart disease Father 71    heart attack  . Prostate cancer Paternal Grandfather   . Pulmonary fibrosis Brother     former smoker    Prior to Admission medications   Medication Sig Start Date End Date Taking? Authorizing Provider  acetaminophen (TYLENOL) 650 MG CR tablet Take 1,300 mg by mouth 2 (two) times daily as needed for pain.   Yes Historical Provider, MD  albuterol (PROVENTIL HFA;VENTOLIN HFA) 108 (90 BASE) MCG/ACT inhaler Inhale 1-2 puffs into the lungs every 6 (six) hours as needed for wheezing or shortness of breath.   Yes Historical Provider, MD  ALPRAZolam Duanne Moron)  0.25 MG tablet Take 1 tablet (0.25 mg total) by mouth every 6 (six) hours as needed for anxiety or sleep. Patient taking differently: Take 0.125-0.25 mg by mouth every 6 (six) hours as needed for anxiety or sleep.  09/08/13  Yes Lucille Passy Babish, PA-C  apixaban (ELIQUIS) 5 MG TABS tablet Take 1 tablet (5 mg total) by mouth 2 (two) times daily. 05/10/14  Yes Jacolyn Reedy, MD  benzonatate (TESSALON) 200 MG capsule Take 200 mg by mouth 3 (three) times daily as needed for cough.   Yes Historical Provider, MD  BIOTIN PO Take  1,000 mcg by mouth daily.   Yes Historical Provider, MD  budesonide-formoterol (SYMBICORT) 160-4.5 MCG/ACT inhaler Inhale 2 puffs into the lungs 2 (two) times daily.     Yes Historical Provider, MD  Cholecalciferol (VITAMIN D3) 1000 UNITS CAPS Take 2,000 Units by mouth daily.    Yes Historical Provider, MD  diltiazem (CARDIZEM CD) 120 MG 24 hr capsule Take 120 mg by mouth daily.  05/05/14  Yes Historical Provider, MD  diphenhydramine-acetaminophen (TYLENOL PM) 25-500 MG TABS Take 2 tablets by mouth at bedtime.   Yes Historical Provider, MD  dofetilide (TIKOSYN) 250 MCG capsule Take 1 capsule (250 mcg total) by mouth 2 (two) times daily. 05/10/14  Yes Jacolyn Reedy, MD  glucosamine-chondroitin 500-400 MG tablet Take 1 tablet by mouth 2 (two) times daily.     Yes Historical Provider, MD  losartan (COZAAR) 100 MG tablet Take 100 mg by mouth every evening.    Yes Historical Provider, MD  metoprolol succinate (TOPROL-XL) 50 MG 24 hr tablet Take 50 mg by mouth daily. Take with or immediately following a meal.   Yes Historical Provider, MD  Polyethyl Glycol-Propyl Glycol (SYSTANE OP) Place 1 drop into both eyes every morning.   Yes Historical Provider, MD  acetaminophen (TYLENOL) 650 MG CR tablet Take 1,300 mg by mouth every 8 (eight) hours as needed for pain.    Historical Provider, MD  amoxicillin-clavulanate (AUGMENTIN) 875-125 MG per tablet Take 1 tablet by mouth 2 (two) times daily. Patient not taking: Reported on 07/19/2014 07/03/14   Tanda Rockers, MD  Guaifenesin [facility] MAXIMUM STRENGTH) 1200 MG TB12 Take 1 tablet by mouth 2 (two) times daily.    Historical Provider, MD  metoprolol succinate (TOPROL-XL) 25 MG 24 hr tablet One twice daily Patient not taking: Reported on 07/19/2014 07/03/14   Tanda Rockers, MD  predniSONE (DELTASONE) 10 MG tablet Take  4 each am x 2 days,   2 each am x 2 days,  1 each am x 2 days and stop Patient not taking: Reported on 07/19/2014 07/03/14   Tanda Rockers, MD     Physical Exam: Filed Vitals:   07/19/14 0115 07/19/14 0200 07/19/14 0242 07/19/14 0252  BP:  162/95  174/90  Pulse: 80 80  80  Temp:    98.2 F (36.8 C)  TempSrc:    Oral  Resp: 15 14  18   Height:    5\' 6"  (1.676 m)  Weight:   81.194 kg (179 lb)   SpO2: 94% 93%  95%    General: Alert, Awake and Oriented to Time, Place and Person. Appear in mild distress Eyes: PERRL ENT: Oral Mucosa clear moist Neck: no JVD Cardiovascular: S1 and S2 Present, no Murmur, Peripheral Pulses Present Respiratory: Bilateral Air entry equal and Decreased, Clear to Auscultation, noCrackles, no wheezes Abdomen: Bowel Soundpresent Soft and non tender Skin: no Rash Extremities: no Pedal  edema, no calf tenderness Neurologic: Grossly no focal neuro deficit.  Labs on Admission:  CBC:  Recent Labs Lab 07/18/14 2357 07/19/14 0400  WBC 9.5 8.4  NEUTROABS 7.4 6.4  HGB 11.0* 10.6*  HCT 34.6* 33.7*  MCV 98.0 98.5  PLT 210 200    CMP     Component Value Date/Time   NA 136* 07/19/2014 0400   K 3.8 07/19/2014 0400   CL 97 07/19/2014 0400   CO2 27 07/19/2014 0400   GLUCOSE 111* 07/19/2014 0400   BUN 11 07/19/2014 0400   CREATININE 0.47* 07/19/2014 0400   CALCIUM 8.9 07/19/2014 0400   PROT 6.7 07/19/2014 0400   ALBUMIN 3.3* 07/19/2014 0400   AST 19 07/19/2014 0400   ALT 11 07/19/2014 0400   ALKPHOS 57 07/19/2014 0400   BILITOT 0.5 07/19/2014 0400   GFRNONAA >90 07/19/2014 0400   GFRAA >90 07/19/2014 0400    No results for input(s): LIPASE, AMYLASE in the last 168 hours. No results for input(s): AMMONIA in the last 168 hours.   Recent Labs Lab 07/18/14 2357 07/19/14 0400  TROPONINI <0.30 <0.30   BNP (last 3 results)  Recent Labs  07/18/14 2357  PROBNP 386.2    Radiological Exams on Admission: Dg Chest 2 View  07/19/2014   CLINICAL DATA:  Initial evaluation for fatigue.  EXAM: CHEST  2 VIEW  COMPARISON:  Prior study from 05/07/2014.  FINDINGS: Cardiomegaly is stable from  prior. Atherosclerotic calcifications present within the aortic arch. Mediastinal silhouette within normal limits.  Changes compatible with underlying COPD present. Curvilinear opacity within the lingula is stable from prior, likely atelectasis and/ or scar. No focal infiltrate, pulmonary edema, or pleural effusion. No pneumothorax. Irregular biapical pleural thickening noted.  No acute osseus abnormality. Sclerotic lesion within the proximal left humerus is stable.  IMPRESSION: 1. No active cardiopulmonary disease. 2. COPD. 3. Stable scarring/atelectasis within the lingula.   Electronically Signed   By: Jeannine Boga M.D.   On: 07/19/2014 00:50   Ct Head Wo Contrast  07/19/2014   CLINICAL DATA:  Initial evaluation for hypertension. Head heaviness.  EXAM: CT HEAD WITHOUT CONTRAST  TECHNIQUE: Contiguous axial images were obtained from the base of the skull through the vertex without intravenous contrast.  COMPARISON:  Prior CT from 03/27/2011  FINDINGS: Atrophy with chronic microvascular ischemic disease again noted.  There is no acute intracranial hemorrhage or infarct. No mass lesion or midline shift. Gray-white matter differentiation is well maintained. Ventricles are normal in size without evidence of hydrocephalus. CSF containing spaces are within normal limits. No extra-axial fluid collection.  The calvarium is intact.  Orbital soft tissues are within normal limits.  The paranasal sinuses and mastoid air cells are well pneumatized and free of fluid.  Scalp soft tissues are unremarkable.  IMPRESSION: 1. No acute intracranial process. 2. Generalized age-related cerebral atrophy with chronic microvascular ischemic disease.   Electronically Signed   By: Jeannine Boga M.D.   On: 07/19/2014 03:14    EKG: Independently reviewed. normal sinus rhythm, T wave inversions in lead 1 and aVL.  Assessment/Plan Principal Problem:   Dizziness Active Problems:   Essential hypertension   COPD GOLD II    Paroxysmal atrial fibrillation   CAD (coronary artery disease), native coronary artery   Ascending aortic aneurysm   Current use of long term anticoagulation   1. Dizziness The patient presents to ER with the complaint of dizziness and lightheadedness as well as high blood pressure. At the time  of my evaluation she does not have any focal deficit and her CT scan of the head is unremarkable. Most likely her blood pressure elevation has caused her to have some dizziness at present we will closely monitor her. PTOT consultation in the morning.  2. accelerated hypertension. Patient's blood pressure was elevated at home but he had her blood pressure is well controlled with 409W to 119J systolic. Most likely she may have significant anxiety component causing her blood pressure to remain elevated at home. Currently continuing her home medication.  3. T wave inversions only in 1 and aVL. Patient denies any complaint of chest pain or chest heaviness. We will follow serial troponin and monitor her on telemetry.  4. history of A. fib and anticoagulation Rate at present well controlled. Continue antiarrhythmic medication as well as Eliquis.   Advance goals of care discussion:  Full code   DVT Prophylaxis:  chronic anticoagulation Nutrition:  Cardiac diet   Disposition: Admitted to observation in telemetry unit.  Author: Berle Mull, MD [facility] Pager: [phone] 07/19/2014, 5:03 AM    If 7PM-7AM, please contact night-coverage [email or web address] Password [email or web address]

## 2014-07-19 NOTE — Progress Notes (Signed)
Subjective:  Patient is well-known to me.  She has a history of paroxysmal atrial fibrillation and has been on Eliquus as well as recently started on Tikosyn.  For the most part she has been in rhythm.  She had eaten more salt recently and her blood pressure was elevated yesterday evening and she stated she did not feel well.  She continued to check her blood pressure and was admitted last night when she complained of dizziness and lightheadedness and came to the hospital.  Blood pressure is still elevated.  Potassium is minimally low today.  She feels better today but is just anxious about her overall situation.  An EKG was interpreted as abnormal in the emergency room but actually had limb lead reversal   Instead.  Objective:  Vital Signs in the last 24 hours: BP 155/95 mmHg  Pulse 84  Temp(Src) 97.7 F (36.5 C) (Oral)  Resp 18  Ht 5\' 6"  (1.676 m)  Wt 81.194 kg (179 lb)  BMI 28.91 kg/m2  SpO2 95%  Physical Exam: Anxious female in no acute distress Lungs:  Clear Cardiac:  Regular rhythm, normal S1 and S2, no S3 Extremities:  No edema present  Intake/Output from previous day: 12/15 0701 - 12/16 0700 In: -  Out: 900 [Urine:900]  Weight Filed Weights   07/19/14 0242  Weight: 81.194 kg (179 lb)    Lab Results: Basic Metabolic Panel:  Recent Labs  07/18/14 2357 07/19/14 0400  NA 138 136*  K 3.9 3.8  CL 98 97  CO2 24 27  GLUCOSE 105* 111*  BUN 13 11  CREATININE 0.50 0.47*   CBC:  Recent Labs  07/18/14 2357 07/19/14 0400  WBC 9.5 8.4  NEUTROABS 7.4 6.4  HGB 11.0* 10.6*  HCT 34.6* 33.7*  MCV 98.0 98.5  PLT 210 200   Cardiac Enzymes:  Recent Labs  07/18/14 2357 07/19/14 0400 07/19/14 0946  TROPONINI <0.30 <0.30 <0.30    Telemetry: Sinus rhythm with QTC of 0.47  Assessment/Plan:  1.  Labile hypertension that may have been worse with sodium ingestion as well as anxiety. 2.  Paroxysmal atrial fibrillation, maintaining sinus rhythm on Tikosyn 3.   Repeat EKG today.  It is normal and has no T-wave changes.  The T-wave changes yesterday were due to lead reversal.  Recommendations:  I think she could probably have early discharge.  I would add spironolactone 25 mg to her regimen for blood pressure control and this also should help to raise her potassium a little bit.      Kerry Hough  MD [facility]  07/19/2014, 1:48 PM

## 2014-07-19 NOTE — Discharge Summary (Signed)
Discharge Summary  [name] [name]:[id number] DOB: [birth year]  PCP: [name] Casco, MD  Admit date: 07/18/2014 Discharge date: 07/19/2014  Time spent: 25 minutes  Recommendations for Outpatient Follow-up:  1. New medication: Spironolactone 25 mg by mouth daily 2. Patient will follow-up with her cardiologist Dr. Tollie Eth, in the next month   Discharge Diagnoses:  Active Hospital Problems   Diagnosis Date Noted  . Dizziness 07/19/2014  . Current use of long term anticoagulation 05/10/2014  . Paroxysmal atrial fibrillation 05/07/2014  . Ascending aortic aneurysm   . CAD (coronary artery disease), native coronary artery   . COPD GOLD II 07/09/2007  . Essential hypertension     Resolved Hospital Problems   Diagnosis Date Noted Date Resolved  No resolved problems to display.    Discharge Condition: Improved, being discharged back to independent living  Diet recommendation: Heart healthy  Filed Weights   07/19/14 0242  Weight: 81.194 kg (179 lb)    History of present illness:  Patient is a 80 year old female past oral history of paroxysmal atrial fibrillation, COPD and hypertension who presented to the emergency room on the night of 12/15 with complaints of lightheadedness. In checking her blood pressure seated noted that they have become more and more elevated as high as 200. In addition, she did report some progressively worsening shortness of breath, but she states this has been as a period of the last 6 months.  EKG was done and T-wave inversions in lead 1 and aVL, but this was felt to be secondary to lead reversal. CT scan of the head done which was unremarkable and patient was not noted to have any neurological deficits on examination  Hospital Course:  Principal Problem:   Dizziness: Felt to be secondary to blood pressure. Patient had no vertigo-like symptoms. With better blood pressure control, her symptoms have since resolved. By later on in the day,  pressures on her own were staying in the 150s/90s. Seen by cardiology who recommended adding spironolactone. Otherwise patient felt to be stable and discharged later on that same day Active Problems:   Essential hypertension: As above   COPD GOLD II: Stable during his hospitalization   Paroxysmal atrial fibrillation: Rate controlled, continued on anticoagulation   CAD (coronary artery disease), native coronary artery   Ascending aortic aneurysm: Followed closely   Current use of long term anticoagulation: As above   Procedures:  None, recent echocardiogram done 2 months prior unremarkable  Consultations:  Dr. Wynonia Lawman, cardiology  Discharge Exam: BP 136/84 mmHg  Pulse 88  Temp(Src) 99.4 F (37.4 C) (Oral)  Resp 18  Ht 5\' 6"  (1.676 m)  Wt 81.194 kg (179 lb)  BMI 28.91 kg/m2  SpO2 93%  General: Alert and oriented 3, no acute distress Cardiovascular: Regular rate and rhythm currently Respiratory: Clear to auscultation bilaterally  Discharge Instructions You were cared for by a hospitalist during your hospital stay. If you have any questions about your discharge medications or the care you received while you were in the hospital after you are discharged, you can call the unit and asked to speak with the hospitalist on call if the hospitalist that took care of you is not available. Once you are discharged, your primary care physician will handle any further medical issues. Please note that NO REFILLS for any discharge medications will be authorized once you are discharged, as it is imperative that you return to your primary care physician (or establish a relationship with a primary care physician if you  do not have one) for your aftercare needs so that they can reassess your need for medications and monitor your lab values.  Discharge Instructions    Diet - low sodium heart healthy    Complete by:  As directed      Increase activity slowly    Complete by:  As directed              Medication List    STOP taking these medications        amoxicillin-clavulanate 875-125 MG per tablet  Commonly known as:  AUGMENTIN      TAKE these medications        acetaminophen 650 MG CR tablet  Commonly known as:  TYLENOL  Take 1,300 mg by mouth every 8 (eight) hours as needed for pain.     albuterol 108 (90 BASE) MCG/ACT inhaler  Commonly known as:  PROVENTIL HFA;VENTOLIN HFA  Inhale 1-2 puffs into the lungs every 6 (six) hours as needed for wheezing or shortness of breath.     ALPRAZolam 0.25 MG tablet  Commonly known as:  XANAX  Take 1 tablet (0.25 mg total) by mouth every 6 (six) hours as needed for anxiety or sleep.     apixaban 5 MG Tabs tablet  Commonly known as:  ELIQUIS  Take 1 tablet (5 mg total) by mouth 2 (two) times daily.     benzonatate 200 MG capsule  Commonly known as:  TESSALON  Take 200 mg by mouth 3 (three) times daily as needed for cough.     BIOTIN PO  Take 1,000 mcg by mouth daily.     budesonide-formoterol 160-4.5 MCG/ACT inhaler  Commonly known as:  SYMBICORT  Inhale 2 puffs into the lungs 2 (two) times daily.     diltiazem 120 MG 24 hr capsule  Commonly known as:  CARDIZEM CD  Take 120 mg by mouth daily.     diphenhydramine-acetaminophen 25-500 MG Tabs  Commonly known as:  TYLENOL PM  Take 2 tablets by mouth at bedtime.     dofetilide 250 MCG capsule  Commonly known as:  TIKOSYN  Take 1 capsule (250 mcg total) by mouth 2 (two) times daily.     glucosamine-chondroitin 500-400 MG tablet  Take 1 tablet by mouth 2 (two) times daily.     losartan 100 MG tablet  Commonly known as:  COZAAR  Take 100 mg by mouth every evening.     metoprolol succinate 50 MG 24 hr tablet  Commonly known as:  TOPROL-XL  Take 50 mg by mouth daily. Take with or immediately following a meal.     MUCINEX MAXIMUM STRENGTH 1200 MG Tb12  Generic drug:  Guaifenesin  Take 1 tablet by mouth 2 (two) times daily.     predniSONE 10 MG tablet  Commonly known  as:  DELTASONE  Take  4 each am x 2 days,   2 each am x 2 days,  1 each am x 2 days and stop     spironolactone 25 MG tablet  Commonly known as:  ALDACTONE  Take 1 tablet (25 mg total) by mouth daily.     SYSTANE OP  Place 1 drop into both eyes every morning.     Vitamin D3 1000 UNITS Caps  Take 2,000 Units by mouth daily.       Allergies  Allergen Reactions  . Benicar [Olmesartan] Diarrhea and Other (See Comments)    Hypotension  . Metronidazole Nausea Only  . Morphine  REACTION: hives       Follow-up Information    Follow up with TILLEY JR,W SPENCER, MD In 1 month.   Specialty:  Cardiology   Contact information:   [address] [place]  [phone]        The results of significant diagnostics from this hospitalization (including imaging, microbiology, ancillary and laboratory) are listed below for reference.    Significant Diagnostic Studies: Dg Chest 2 View  07/19/2014   CLINICAL DATA:  Initial evaluation for fatigue.  EXAM: CHEST  2 VIEW  COMPARISON:  Prior study from 05/07/2014.  FINDINGS: Cardiomegaly is stable from prior. Atherosclerotic calcifications present within the aortic arch. Mediastinal silhouette within normal limits.  Changes compatible with underlying COPD present. Curvilinear opacity within the lingula is stable from prior, likely atelectasis and/ or scar. No focal infiltrate, pulmonary edema, or pleural effusion. No pneumothorax. Irregular biapical pleural thickening noted.  No acute osseus abnormality. Sclerotic lesion within the proximal left humerus is stable.  IMPRESSION: 1. No active cardiopulmonary disease. 2. COPD. 3. Stable scarring/atelectasis within the lingula.   Electronically Signed   By: Jeannine Boga M.D.   On: 07/19/2014 00:50   Ct Head Wo Contrast  07/19/2014   CLINICAL DATA:  Initial evaluation for hypertension. Head heaviness.  EXAM: CT HEAD WITHOUT CONTRAST  TECHNIQUE: Contiguous axial  images were obtained from the base of the skull through the vertex without intravenous contrast.  COMPARISON:  Prior CT from 03/27/2011  FINDINGS: Atrophy with chronic microvascular ischemic disease again noted.  There is no acute intracranial hemorrhage or infarct. No mass lesion or midline shift. Gray-white matter differentiation is well maintained. Ventricles are normal in size without evidence of hydrocephalus. CSF containing spaces are within normal limits. No extra-axial fluid collection.  The calvarium is intact.  Orbital soft tissues are within normal limits.  The paranasal sinuses and mastoid air cells are well pneumatized and free of fluid.  Scalp soft tissues are unremarkable.  IMPRESSION: 1. No acute intracranial process. 2. Generalized age-related cerebral atrophy with chronic microvascular ischemic disease.   Electronically Signed   By: Jeannine Boga M.D.   On: 07/19/2014 03:14    Microbiology: Recent Results (from the past 240 hour(s))  MRSA PCR Screening     Status: None   Collection Time: 07/19/14  2:50 AM  Result Value Ref Range Status   MRSA by PCR NEGATIVE NEGATIVE Final    Comment:        The GeneXpert MRSA Assay (FDA approved for NASAL specimens only), is one component of a comprehensive MRSA colonization surveillance program. It is not intended to diagnose MRSA infection nor to guide or monitor treatment for MRSA infections.      Labs: Basic Metabolic Panel:  Recent Labs Lab 07/18/14 2357 07/19/14 0400  NA 138 136*  K 3.9 3.8  CL 98 97  CO2 24 27  GLUCOSE 105* 111*  BUN 13 11  CREATININE 0.50 0.47*  CALCIUM 9.0 8.9  PHOS  --  3.0   Liver Function Tests:  Recent Labs Lab 07/18/14 2357 07/19/14 0400  AST 19 19  ALT 11 11  ALKPHOS 54 57  BILITOT 0.4 0.5  PROT 6.8 6.7  ALBUMIN 3.4* 3.3*   No results for input(s): LIPASE, AMYLASE in the last 168 hours. No results for input(s): AMMONIA in the last 168 hours. CBC:  Recent Labs Lab  07/18/14 2357 07/19/14 0400  WBC 9.5 8.4  NEUTROABS 7.4 6.4  HGB  11.0* 10.6*  HCT 34.6* 33.7*  MCV 98.0 98.5  PLT 210 200   Cardiac Enzymes:  Recent Labs Lab 07/18/14 2357 07/19/14 0400 07/19/14 0946 07/19/14 1409  TROPONINI <0.30 <0.30 <0.30 <0.30   BNP: BNP (last 3 results)  Recent Labs  07/18/14 2357  PROBNP 386.2   CBG: No results for input(s): GLUCAP in the last 168 hours.     Signed:  Annita Brod  [facility] 07/19/2014, 5:25 PM

## 2014-07-19 NOTE — Clinical Social Work Note (Addendum)
CSW met with patient to discuss where she is from.  Patient is from [facility] which has a shuttle that can pick patient up.  Contacted [facility] and received the phone number for the shuttle pick up, gave phone number [phone] to nurse to call when patient is ready to discharge back home.  Patient does not have any recommendations for short term rehab or SNF placement.  CSW will sign off.  Jones Broom. [place], MSW, [place] 07/19/2014 2:42 PM

## 2014-07-20 LAB — URINE CULTURE
CULTURE: NO GROWTH
Colony Count: NO GROWTH

## 2014-08-11 ENCOUNTER — Ambulatory Visit
Admission: RE | Admit: 2014-08-11 | Discharge: 2014-08-11 | Disposition: A | Discharge status: Home / Self Care | Payer: Medicare Other | Source: Ambulatory Visit | Attending: Family Medicine | Admitting: Family Medicine

## 2014-08-11 ENCOUNTER — Other Ambulatory Visit: Payer: Self-pay | Admitting: Family Medicine

## 2014-08-11 DIAGNOSIS — R059 Cough, unspecified: Secondary | ICD-10-CM

## 2014-08-11 DIAGNOSIS — R05 Cough: Secondary | ICD-10-CM

## 2014-08-14 ENCOUNTER — Telehealth: Payer: Self-pay | Admitting: Internal Medicine

## 2014-08-14 MED ORDER — ALBUTEROL SULFATE HFA 108 (90 BASE) MCG/ACT IN AERS
1.0000 | INHALATION_SPRAY | Freq: Four times a day (QID) | RESPIRATORY_TRACT | Status: AC | PRN
Start: 2014-08-14 — End: ?

## 2014-08-14 NOTE — Telephone Encounter (Signed)
Ventolin Rx sent to [facility]. Pt aware. Nothing further needed.

## 2014-08-18 ENCOUNTER — Encounter (HOSPITAL_BASED_OUTPATIENT_CLINIC_OR_DEPARTMENT_OTHER): Payer: Medicare Other | Attending: Internal Medicine

## 2014-10-06 ENCOUNTER — Encounter (HOSPITAL_BASED_OUTPATIENT_CLINIC_OR_DEPARTMENT_OTHER): Payer: Medicare Other | Attending: Internal Medicine

## 2014-10-06 DIAGNOSIS — J449 Chronic obstructive pulmonary disease, unspecified: Secondary | ICD-10-CM | POA: Diagnosis not present

## 2014-10-06 DIAGNOSIS — L97221 Non-pressure chronic ulcer of left calf limited to breakdown of skin: Secondary | ICD-10-CM | POA: Insufficient documentation

## 2014-10-06 DIAGNOSIS — Z87891 Personal history of nicotine dependence: Secondary | ICD-10-CM | POA: Diagnosis not present

## 2014-10-06 DIAGNOSIS — T8133XA Disruption of traumatic injury wound repair, initial encounter: Secondary | ICD-10-CM | POA: Diagnosis not present

## 2014-10-06 DIAGNOSIS — I10 Essential (primary) hypertension: Secondary | ICD-10-CM | POA: Diagnosis not present

## 2014-10-13 DIAGNOSIS — J449 Chronic obstructive pulmonary disease, unspecified: Secondary | ICD-10-CM | POA: Diagnosis not present

## 2014-10-13 DIAGNOSIS — Z87891 Personal history of nicotine dependence: Secondary | ICD-10-CM | POA: Diagnosis not present

## 2014-10-13 DIAGNOSIS — I10 Essential (primary) hypertension: Secondary | ICD-10-CM | POA: Diagnosis not present

## 2014-10-13 DIAGNOSIS — L97221 Non-pressure chronic ulcer of left calf limited to breakdown of skin: Secondary | ICD-10-CM | POA: Diagnosis not present

## 2014-10-20 DIAGNOSIS — J449 Chronic obstructive pulmonary disease, unspecified: Secondary | ICD-10-CM | POA: Diagnosis not present

## 2014-10-20 DIAGNOSIS — L97221 Non-pressure chronic ulcer of left calf limited to breakdown of skin: Secondary | ICD-10-CM | POA: Diagnosis not present

## 2014-10-20 DIAGNOSIS — I10 Essential (primary) hypertension: Secondary | ICD-10-CM | POA: Diagnosis not present

## 2014-10-20 DIAGNOSIS — Z87891 Personal history of nicotine dependence: Secondary | ICD-10-CM | POA: Diagnosis not present

## 2014-10-27 DIAGNOSIS — L97221 Non-pressure chronic ulcer of left calf limited to breakdown of skin: Secondary | ICD-10-CM | POA: Diagnosis not present

## 2014-10-27 DIAGNOSIS — I10 Essential (primary) hypertension: Secondary | ICD-10-CM | POA: Diagnosis not present

## 2014-10-27 DIAGNOSIS — Z87891 Personal history of nicotine dependence: Secondary | ICD-10-CM | POA: Diagnosis not present

## 2014-10-27 DIAGNOSIS — J449 Chronic obstructive pulmonary disease, unspecified: Secondary | ICD-10-CM | POA: Diagnosis not present

## 2014-11-03 ENCOUNTER — Encounter (HOSPITAL_BASED_OUTPATIENT_CLINIC_OR_DEPARTMENT_OTHER): Payer: Medicare Other | Attending: Internal Medicine

## 2014-11-03 DIAGNOSIS — L97221 Non-pressure chronic ulcer of left calf limited to breakdown of skin: Secondary | ICD-10-CM | POA: Diagnosis not present

## 2014-11-03 DIAGNOSIS — X58XXXD Exposure to other specified factors, subsequent encounter: Secondary | ICD-10-CM | POA: Diagnosis not present

## 2014-11-14 ENCOUNTER — Other Ambulatory Visit: Payer: Self-pay

## 2014-11-14 DIAGNOSIS — Z1231 Encounter for screening mammogram for malignant neoplasm of breast: Secondary | ICD-10-CM

## 2014-11-16 DIAGNOSIS — L97221 Non-pressure chronic ulcer of left calf limited to breakdown of skin: Secondary | ICD-10-CM | POA: Diagnosis not present

## 2014-11-20 ENCOUNTER — Other Ambulatory Visit: Payer: Self-pay | Admitting: Cardiology

## 2014-11-20 DIAGNOSIS — J449 Chronic obstructive pulmonary disease, unspecified: Secondary | ICD-10-CM

## 2014-11-27 ENCOUNTER — Ambulatory Visit
Admission: RE | Admit: 2014-11-27 | Discharge: 2014-11-27 | Disposition: A | Discharge status: Home / Self Care | Payer: Medicare Other | Source: Ambulatory Visit | Attending: Cardiology | Admitting: Cardiology

## 2014-11-27 DIAGNOSIS — J449 Chronic obstructive pulmonary disease, unspecified: Secondary | ICD-10-CM

## 2014-12-24 ENCOUNTER — Emergency Department (HOSPITAL_COMMUNITY): Payer: Medicare Other

## 2014-12-24 ENCOUNTER — Emergency Department (HOSPITAL_COMMUNITY)
Admission: EM | Admit: 2014-12-24 | Discharge: 2014-12-24 | Disposition: A | Discharge status: Home / Self Care | Payer: Medicare Other | Attending: Emergency Medicine | Admitting: Emergency Medicine

## 2014-12-24 ENCOUNTER — Encounter (HOSPITAL_COMMUNITY): Payer: Self-pay | Admitting: Emergency Medicine

## 2014-12-24 DIAGNOSIS — S5001XA Contusion of right elbow, initial encounter: Secondary | ICD-10-CM | POA: Diagnosis not present

## 2014-12-24 DIAGNOSIS — Y9289 Other specified places as the place of occurrence of the external cause: Secondary | ICD-10-CM | POA: Diagnosis not present

## 2014-12-24 DIAGNOSIS — Z8719 Personal history of other diseases of the digestive system: Secondary | ICD-10-CM | POA: Diagnosis not present

## 2014-12-24 DIAGNOSIS — Y9389 Activity, other specified: Secondary | ICD-10-CM | POA: Diagnosis not present

## 2014-12-24 DIAGNOSIS — Z862 Personal history of diseases of the blood and blood-forming organs and certain disorders involving the immune mechanism: Secondary | ICD-10-CM | POA: Insufficient documentation

## 2014-12-24 DIAGNOSIS — Z79899 Other long term (current) drug therapy: Secondary | ICD-10-CM | POA: Insufficient documentation

## 2014-12-24 DIAGNOSIS — Z7951 Long term (current) use of inhaled steroids: Secondary | ICD-10-CM | POA: Diagnosis not present

## 2014-12-24 DIAGNOSIS — R059 Cough, unspecified: Secondary | ICD-10-CM

## 2014-12-24 DIAGNOSIS — Z8701 Personal history of pneumonia (recurrent): Secondary | ICD-10-CM | POA: Insufficient documentation

## 2014-12-24 DIAGNOSIS — Z8669 Personal history of other diseases of the nervous system and sense organs: Secondary | ICD-10-CM | POA: Diagnosis not present

## 2014-12-24 DIAGNOSIS — W1839XA Other fall on same level, initial encounter: Secondary | ICD-10-CM | POA: Insufficient documentation

## 2014-12-24 DIAGNOSIS — R509 Fever, unspecified: Secondary | ICD-10-CM | POA: Diagnosis present

## 2014-12-24 DIAGNOSIS — R05 Cough: Secondary | ICD-10-CM

## 2014-12-24 DIAGNOSIS — Z8673 Personal history of transient ischemic attack (TIA), and cerebral infarction without residual deficits: Secondary | ICD-10-CM | POA: Diagnosis not present

## 2014-12-24 DIAGNOSIS — Y998 Other external cause status: Secondary | ICD-10-CM | POA: Insufficient documentation

## 2014-12-24 DIAGNOSIS — I10 Essential (primary) hypertension: Secondary | ICD-10-CM | POA: Insufficient documentation

## 2014-12-24 DIAGNOSIS — J441 Chronic obstructive pulmonary disease with (acute) exacerbation: Secondary | ICD-10-CM | POA: Insufficient documentation

## 2014-12-24 DIAGNOSIS — Z7902 Long term (current) use of antithrombotics/antiplatelets: Secondary | ICD-10-CM | POA: Insufficient documentation

## 2014-12-24 DIAGNOSIS — R531 Weakness: Secondary | ICD-10-CM

## 2014-12-24 DIAGNOSIS — M6281 Muscle weakness (generalized): Secondary | ICD-10-CM | POA: Diagnosis not present

## 2014-12-24 DIAGNOSIS — F419 Anxiety disorder, unspecified: Secondary | ICD-10-CM | POA: Diagnosis not present

## 2014-12-24 DIAGNOSIS — Z87891 Personal history of nicotine dependence: Secondary | ICD-10-CM | POA: Insufficient documentation

## 2014-12-24 LAB — URINALYSIS, ROUTINE W REFLEX MICROSCOPIC
Bilirubin Urine: NEGATIVE
GLUCOSE, UA: NEGATIVE mg/dL
Ketones, ur: NEGATIVE mg/dL
LEUKOCYTES UA: NEGATIVE
Nitrite: NEGATIVE
Protein, ur: NEGATIVE mg/dL
Specific Gravity, Urine: 1.01 (ref 1.005–1.030)
Urobilinogen, UA: 0.2 mg/dL (ref 0.0–1.0)
pH: 6.5 (ref 5.0–8.0)

## 2014-12-24 LAB — CBC WITH DIFFERENTIAL/PLATELET
Basophils Absolute: 0 10*3/uL (ref 0.0–0.1)
Basophils Relative: 0 % (ref 0–1)
Eosinophils Absolute: 0 10*3/uL (ref 0.0–0.7)
Eosinophils Relative: 0 % (ref 0–5)
HCT: 33.5 % — ABNORMAL LOW (ref 36.0–46.0)
HEMOGLOBIN: 10.2 g/dL — AB (ref 12.0–15.0)
LYMPHS ABS: 0.4 10*3/uL — AB (ref 0.7–4.0)
LYMPHS PCT: 4 % — AB (ref 12–46)
MCH: 29.1 pg (ref 26.0–34.0)
MCHC: 30.4 g/dL (ref 30.0–36.0)
MCV: 95.7 fL (ref 78.0–100.0)
MONOS PCT: 11 % (ref 3–12)
Monocytes Absolute: 1 10*3/uL (ref 0.1–1.0)
Neutro Abs: 8.3 10*3/uL — ABNORMAL HIGH (ref 1.7–7.7)
Neutrophils Relative %: 85 % — ABNORMAL HIGH (ref 43–77)
Platelets: 264 10*3/uL (ref 150–400)
RBC: 3.5 MIL/uL — AB (ref 3.87–5.11)
RDW: 14.5 % (ref 11.5–15.5)
WBC: 9.8 10*3/uL (ref 4.0–10.5)

## 2014-12-24 LAB — COMPREHENSIVE METABOLIC PANEL
ALT: 16 U/L (ref 14–54)
ANION GAP: 11 (ref 5–15)
AST: 25 U/L (ref 15–41)
Albumin: 3.3 g/dL — ABNORMAL LOW (ref 3.5–5.0)
Alkaline Phosphatase: 50 U/L (ref 38–126)
BILIRUBIN TOTAL: 0.6 mg/dL (ref 0.3–1.2)
BUN: 19 mg/dL (ref 6–20)
CO2: 27 mmol/L (ref 22–32)
Calcium: 9 mg/dL (ref 8.9–10.3)
Chloride: 94 mmol/L — ABNORMAL LOW (ref 101–111)
Creatinine, Ser: 0.77 mg/dL (ref 0.44–1.00)
GFR calc Af Amer: >60 mL/min (ref >60)
GLUCOSE: 122 mg/dL — AB (ref 65–99)
Potassium: 4.7 mmol/L (ref 3.5–5.1)
SODIUM: 132 mmol/L — AB (ref 135–145)
TOTAL PROTEIN: 7.4 g/dL (ref 6.5–8.1)

## 2014-12-24 LAB — I-STAT TROPONIN, ED: Troponin i, poc: 0.04 ng/mL (ref 0.00–0.08)

## 2014-12-24 LAB — URINE MICROSCOPIC-ADD ON

## 2014-12-24 MED ORDER — SODIUM CHLORIDE 0.9 % IV BOLUS (SEPSIS)
1000.0000 mL | INTRAVENOUS | Status: AC
  Administered 2014-12-24: 1000 mL via INTRAVENOUS

## 2014-12-24 MED ORDER — ACETAMINOPHEN 325 MG PO TABS
650.0000 mg | ORAL_TABLET | Freq: Once | ORAL | Status: AC
  Administered 2014-12-24: 650 mg via ORAL
  Filled 2014-12-24: qty 2

## 2014-12-24 NOTE — ED Notes (Signed)
[facility] arrived to transport pt back to [facility] facility.

## 2014-12-24 NOTE — Discharge Instructions (Signed)
The patient was evaluated in the ER with screening lab work, urinalysis, and chest x-ray. I suspect her fever is the cause of her generalized weakness and she should be treated with Tylenol every 6 hours as needed for fever. The fever is likely related to a virus or ongoing upper respiratory infection. Her chest x-ray showed resolving atypical pneumonia without new findings.

## 2014-12-24 NOTE — ED Provider Notes (Signed)
CSN: [id number]     Arrival date & time 12/24/14  1130 History   First MD Initiated Contact with Patient 12/24/14 1149     Chief Complaint  Patient presents with  . Weakness  . Fever     (Consider location/radiation/quality/duration/timing/severity/associated sxs/prior Treatment) Patient is a 81 y.o. female presenting with weakness, fever, and cough. The history is provided by the patient.  Weakness This is a new problem. The current episode started more than 2 days ago. The problem occurs constantly. The problem has been gradually worsening. Associated symptoms include shortness of breath. Pertinent negatives include no chest pain, no abdominal pain and no headaches. Nothing aggravates the symptoms. Nothing relieves the symptoms. She has tried nothing for the symptoms. The treatment provided no relief.  Fever Associated symptoms: cough   Associated symptoms: no chest pain, no congestion, no diarrhea, no dysuria, no headaches, no nausea and no vomiting   Cough Cough characteristics:  Productive Sputum characteristics:  Green Severity:  Mild Onset quality:  Gradual Timing:  Constant Progression:  Unchanged Chronicity:  New Context: upper respiratory infection   Relieved by:  Nothing Worsened by:  Nothing tried Ineffective treatments: course of doxy. Associated symptoms: fever and shortness of breath   Associated symptoms: no chest pain and no headaches     Past Medical History  Diagnosis Date  . COPD (chronic obstructive pulmonary disease)     pft's 09/15/2005, FEV1 1.69 (76%) with ration67 and better x 14% p B2, DLC 95%  . Hypertension   . Bronchitis, chronic   . Celiac disease   . Anxiety   . Pneumonia     hx of x3  . Acute blood loss anemia 09/07/13  . Anxiety state, unspecified   . Bronchiectasis 2012  . Cerebrovascular disease, unspecified 05/26/13  . Cerebral atrophy 05/26/13   Past Surgical History  Procedure Laterality Date  . Nasal sinus surgery  years ago  .  Total knee arthroplasty Bilateral [id number]  . Excision morton's neuroma Bilateral 35 years ago, 2010  . Total hip arthroplasty Right 2009    Dr. Telford Nab  . Cataract extraction Bilateral ~8 years ago  . Total hip arthroplasty Left 09/06/2013    Dr. Salli Quarry   Family History  Problem Relation Age of Onset  . Heart disease Father 2    heart attack  . Prostate cancer Paternal Grandfather   . Pulmonary fibrosis Brother     former smoker   History  Substance Use Topics  . Smoking status: Former Smoker -- 0.50 packs/day for 60 years    Types: Cigarettes    Quit date: 01/27/2012  . Smokeless tobacco: Never Used  . Alcohol Use: Yes     Comment: Wine and Gin daily   OB History    No data available     Review of Systems  Constitutional: Positive for fever. Negative for fatigue.  HENT: Negative for congestion and drooling.   Eyes: Negative for pain.  Respiratory: Positive for cough and shortness of breath.   Cardiovascular: Negative for chest pain.  Gastrointestinal: Negative for nausea, vomiting, abdominal pain and diarrhea.  Genitourinary: Negative for dysuria and hematuria.  Musculoskeletal: Negative for back pain, gait problem and neck pain.  Skin: Negative for color change.  Neurological: Positive for weakness. Negative for dizziness and headaches.  Hematological: Negative for adenopathy.  Psychiatric/Behavioral: Negative for behavioral problems.  All other systems reviewed and are negative.     Allergies  Benicar; Metronidazole; and Morphine  Home Medications  Prior to Admission medications   Medication Sig Start Date End Date Taking? Authorizing Provider  ALPRAZolam (XANAX) 0.25 MG tablet Take 1 tablet (0.25 mg total) by mouth every 6 (six) hours as needed for anxiety or sleep. Patient taking differently: Take 0.125-0.25 mg by mouth every 6 (six) hours as needed for anxiety or sleep.  09/08/13  Yes Danae Orleans, PA-C  apixaban (ELIQUIS) 5 MG TABS tablet Take 1  tablet (5 mg total) by mouth 2 (two) times daily. 05/10/14  Yes Jacolyn Reedy, MD  BIOTIN PO Take 1,000 mcg by mouth daily.   Yes Historical Provider, MD  budesonide-formoterol (SYMBICORT) 160-4.5 MCG/ACT inhaler Inhale 2 puffs into the lungs 2 (two) times daily.     Yes Historical Provider, MD  Cholecalciferol (VITAMIN D3) 1000 UNITS CAPS Take 1,000 Units by mouth daily.    Yes Historical Provider, MD  diltiazem (CARDIZEM CD) 120 MG 24 hr capsule Take 120 mg by mouth every morning.  05/05/14  Yes Historical Provider, MD  Diphenhydramine-Phenylephrine 25-10 MG TABS Take 1 tablet by mouth at bedtime as needed (for sleep and decongestant).   Yes Historical Provider, MD  dofetilide (TIKOSYN) 250 MCG capsule Take 1 capsule (250 mcg total) by mouth 2 (two) times daily. 05/10/14  Yes Jacolyn Reedy, MD  glucosamine-chondroitin 500-400 MG tablet Take 1 tablet by mouth 2 (two) times daily.     Yes Historical Provider, MD  losartan (COZAAR) 100 MG tablet Take 100 mg by mouth every evening.    Yes Historical Provider, MD  spironolactone (ALDACTONE) 25 MG tablet Take 1 tablet (25 mg total) by mouth daily. 07/19/14  Yes Annita Brod, MD  acetaminophen (TYLENOL) 650 MG CR tablet Take 1,300 mg by mouth every 8 (eight) hours as needed for pain.    Historical Provider, MD  albuterol (PROVENTIL HFA;VENTOLIN HFA) 108 (90 BASE) MCG/ACT inhaler Inhale 1-2 puffs into the lungs every 6 (six) hours as needed for wheezing or shortness of breath. 08/14/14   Tanda Rockers, MD  benzonatate (TESSALON) 200 MG capsule Take 200 mg by mouth 3 (three) times daily as needed for cough.    Historical Provider, MD  diphenhydramine-acetaminophen (TYLENOL PM) 25-500 MG TABS Take 2 tablets by mouth at bedtime.    Historical Provider, MD  Guaifenesin [facility] MAXIMUM STRENGTH) 1200 MG TB12 Take 1 tablet by mouth 2 (two) times daily.    Historical Provider, MD  metoprolol succinate (TOPROL-XL) 50 MG 24 hr tablet Take 50 mg by mouth  daily. Take with or immediately following a meal.    Historical Provider, MD  Polyethyl Glycol-Propyl Glycol (SYSTANE OP) Place 1 drop into both eyes every morning.    Historical Provider, MD  predniSONE (DELTASONE) 10 MG tablet Take  4 each am x 2 days,   2 each am x 2 days,  1 each am x 2 days and stop Patient not taking: Reported on 07/19/2014 07/03/14   Tanda Rockers, MD   BP 106/90 mmHg  Pulse 76  Temp(Src) 98.1 F (36.7 C) (Oral)  Resp 24  SpO2 92% Physical Exam  Constitutional: She is oriented to person, place, and time. She appears well-developed and well-nourished.  HENT:  Head: Normocephalic.  Mouth/Throat: Oropharynx is clear and moist. No oropharyngeal exudate.  Eyes: Conjunctivae and EOM are normal. Pupils are equal, round, and reactive to light.  Neck: Normal range of motion. Neck supple.  Cardiovascular: Normal rate, regular rhythm, normal heart sounds and intact distal pulses.  Exam reveals  no gallop and no friction rub.   No murmur heard. Pulmonary/Chest: Effort normal and breath sounds normal. No respiratory distress. She has no wheezes.  Abdominal: Soft. Bowel sounds are normal. There is no tenderness. There is no rebound and no guarding.  Musculoskeletal: Normal range of motion. She exhibits no edema or tenderness.  Mild bruising to the posterior aspect of the right elbow without significant tenderness.  No vertebral tenderness noted.  Normal range of motion of the hips bilaterally without pain.  Neurological: She is alert and oriented to person, place, and time.  alert, oriented x3 speech: normal in context and clarity memory: intact grossly cranial nerves II-XII: intact motor strength: full proximally and distally no involuntary movements or tremors sensation: intact to light touch diffusely  cerebellar: finger-to-nose and heel-to-shin intact gait: Able to ambulate with moderate assistance.  Skin: Skin is warm and dry.  Psychiatric: She has a normal mood  and affect. Her behavior is normal.  Nursing note and vitals reviewed.   ED Course  Procedures (including critical care time) Labs Review Labs Reviewed  CBC WITH DIFFERENTIAL/PLATELET - Abnormal; Notable for the following:    RBC 3.50 (*)    Hemoglobin 10.2 (*)    HCT 33.5 (*)    Neutrophils Relative % 85 (*)    Neutro Abs 8.3 (*)    Lymphocytes Relative 4 (*)    Lymphs Abs 0.4 (*)    All other components within normal limits  COMPREHENSIVE METABOLIC PANEL - Abnormal; Notable for the following:    Sodium 132 (*)    Chloride 94 (*)    Glucose, Bld 122 (*)    Albumin 3.3 (*)    All other components within normal limits  URINALYSIS, ROUTINE W REFLEX MICROSCOPIC - Abnormal; Notable for the following:    Hgb urine dipstick SMALL (*)    All other components within normal limits  URINE MICROSCOPIC-ADD ON  Randolm Idol, ED    Imaging Review Dg Chest 2 View  12/24/2014   CLINICAL DATA:  Cough, fever and weakness for the past 3 days.  EXAM: CHEST  2 VIEW  COMPARISON:  Chest CT dated 11/27/2014 and chest radiographs dated 08/11/2014.  FINDINGS: Normal sized heart. No significant change in patchy and linear densities at both lung bases. The previously demonstrated right upper lobe opacities are no longer demonstrated. Mild thoracic spine degenerative changes.  IMPRESSION: 1. Resolved right upper lobe atypical infection. 2. Stable changes due to bronchiectasis in the lingula. 3. Stable changes of bronchiectasis and additional scarring or infection in the right lower lung zone.   Electronically Signed   By: Claudie Revering M.D.   On: 12/24/2014 13:25     EKG Interpretation   Date/Time:  Sunday Dec 24 2014 12:24:43 EDT Ventricular Rate:  70 PR Interval:  181 QRS Duration: 91 QT Interval:  441 QTC Calculation: 476 R Axis:   6 Text Interpretation:  Sinus rhythm Probable left atrial enlargement RSR'  in V1 or V2, right VCD or RVH No significant change since last tracing  Confirmed by  Dontee Jaso  MD, Itzia Cunliffe ([id number]) on 12/24/2014 4:26:03 PM      MDM   Final diagnoses:  Generalized weakness  Cough    12:21 PM 81 y.o. female w hx of copd, htn, on eliquis who presents with cough productive of green sputum. The patient states that she finished a course of doxycycline about 10 days ago. She has had ongoing productive cough and worsening generalized weakness since that time.  She notes shortness of breath with exertion. She had a mechanical fall last night which she attributes to generalized weakness. She states that she fell onto her bottom and braced herself with her elbows. She complains of some mild lower back pain but has no focal vertebral tenderness and otherwise denies pain. She did not hit her head or lose consciousness. She denies any headaches. She notes a temperature of 102 prior to arrival. She has not taken any antipyretics today. Vital signs unremarkable here. We'll get screening labs and imaging.  4:26 PM: I interpreted/reviewed the labs and/or imaging which were non-contributory. The patient continues to appear well and exam. I suspect her generalized weakness is related to the fever and likely ongoing URI versus virus. [facility] has the availability to increase her level of care and I had the social worker help arrange this. I have discussed the diagnosis/risks/treatment options with the patient and believe the pt to be eligible for discharge home to follow-up with her pcp as needed. We also discussed returning to the ED immediately if new or worsening sx occur. We discussed the sx which are most concerning (e.g., vomiting, intractable fever, worsening sob) that necessitate immediate return. Medications administered to the patient during their visit and any new prescriptions provided to the patient are listed below.  Medications given during this visit Medications  sodium chloride 0.9 % bolus 1,000 mL (1,000 mLs Intravenous New Bag/Given 12/24/14 1219)  acetaminophen  (TYLENOL) tablet 650 mg (650 mg Oral Given 12/24/14 1219)    New Prescriptions   No medications on file     Pamella Pert, MD 12/24/14 1628

## 2014-12-24 NOTE — Progress Notes (Signed)
4:30pm. CSW received consult from MD for change of level of care from independent living to SNF.   CSW met with patient, and daughter [name] and son-in-law Tol, at bedside. Pt is currently resident at [facility] assisted [facility]. MD is recommending SNF. Pt is in agreement with change in level of care. Pt expressed some anxiety about finding out what is wrong--however, states she is glad to go to higher level of care.  CSW spoke with DON Dian Situ at [facility]. CSW faxed FL-2 and AVS and placed on chart to [phone]. They can offer bed #145. Call report to [phone].   5:07pm. CSW spoke with MD Celesta Aver at [facility]. They currently do not have a rehab bed but, if pt medically able, they will take to ALF tonight and move to rehab tomorrow. CSW ran this by our medical team, who stated this would be fine.   Plan for patient to discharge to [facility]. Call report to [place] at [phone].  [facility] phone: [phone]

## 2014-12-24 NOTE — ED Notes (Signed)
Bed: WA19 Expected date:  Expected time:  Means of arrival:  Comments: EMS 

## 2014-12-24 NOTE — ED Notes (Addendum)
Awake. Verbally responsive. A/O x4. Resp even and unlabored. No audible adventitious breath sounds noted. ABC's intact. SR on monitor. IV infusing NS at 999ml/hr without difficulty. 

## 2014-12-24 NOTE — ED Notes (Signed)
Awake. Verbally responsive. A/O x4. Resp even and unlabored. No audible adventitious breath sounds noted. ABC's intact. SR on monitor. IV saline lock patent and intact. 

## 2014-12-24 NOTE — ED Notes (Addendum)
Pt arrived via EMS from [facility] with report of occ productive cough of light green sputum, generalized weakness and fever (102.0). Pt reported [facility] with exertion. Recently treated for Bronchitis. Auscultated clear breath sounds bil and throughout. Resp even and unlabored. Pt denies urinary problems. Pt reported falling during the night without LOC. Reported injury to bil elbows. No bruising noted.

## 2014-12-24 NOTE — ED Notes (Signed)
Pt aware of need of urine sample. States she will notify staff when she is able to obtain sample

## 2014-12-24 NOTE — ED Notes (Signed)
Called communications for [employer].

## 2014-12-24 NOTE — ED Notes (Addendum)
Awake. Verbally responsive. A/O x4. Resp even and unlabored. No audible adventitious breath sounds noted. ABC's intact. SR on monitor. IV saline lock patent and intact. 

## 2014-12-25 ENCOUNTER — Encounter: Payer: Self-pay | Admitting: Adult Health

## 2014-12-25 ENCOUNTER — Non-Acute Institutional Stay (SKILLED_NURSING_FACILITY): Payer: Medicare Other | Admitting: Adult Health

## 2014-12-25 DIAGNOSIS — M545 Low back pain, unspecified: Secondary | ICD-10-CM

## 2014-12-25 DIAGNOSIS — J449 Chronic obstructive pulmonary disease, unspecified: Secondary | ICD-10-CM | POA: Diagnosis not present

## 2014-12-25 DIAGNOSIS — R509 Fever, unspecified: Secondary | ICD-10-CM | POA: Diagnosis not present

## 2014-12-25 NOTE — Progress Notes (Signed)
Patient ID: [name] [name], female   DOB: [birth year], 81 y.o.   MRN: [id number]    Nursing Home Location:  [place] of Service: SNF (31)  PCP: Osborne Casco, MD  Allergies  Allergen Reactions  . Benicar [Olmesartan] Diarrhea and Other (See Comments)    Hypotension  . Metronidazole Nausea Only  . Morphine Hives    Chief Complaint  Patient presents with  . Acute Visit    back pain    HPI:  Patient is a 81 y.o. female seen today at [facility] admitted to the rehab section due to acute low back pain s/p ER visit.  She lives independently and had a fell on 12/23/14 and since that time she has had low back pain with difficulty bending over.  The pain does not radiate. She has some relief with Tylenol and heating pads.  She was seen in the ER on 5/22 due to weakness and a fever of 102.  She reports that she has had chills since Friday 5/20.  She has a hx of COPD and bronchiectasis and has a chronic cough with sputum production. She reports that this is roughly the same with a slight change in color of her sputum to light green.  She denies SOB but does have DOE at baseline. She has not used any prn albuterol.   She is oxygenating well at 94% on RA.  She had an xray in the ER showing resolving atypical pneumonia.her labs in the ER were fairly unremarkable with an increased neutrophil % noted. Urine dipstick was negative as well. She was treated by her PCP with Doxycycline 10 days ago.   She is not febrile now but still feels weak and would like PT.    Review of Systems:  Review of Systems  Constitutional: Positive for fever, chills, diaphoresis, activity change, appetite change and fatigue. Negative for unexpected weight change.  HENT: Negative for congestion, ear pain, facial swelling, nosebleeds, postnasal drip, rhinorrhea, sore throat and trouble swallowing.   Eyes: Negative for discharge and itching.  Respiratory: Positive for  cough. Negative for chest tightness, shortness of breath, wheezing and stridor.   Cardiovascular: Negative for chest pain, palpitations and leg swelling.  Gastrointestinal: Negative for abdominal pain, diarrhea, constipation and abdominal distention.  Genitourinary: Negative for dysuria and flank pain.  Musculoskeletal: Positive for back pain and gait problem. Negative for arthralgias.  Neurological: Negative for dizziness, seizures, syncope and speech difficulty.  Psychiatric/Behavioral: Negative for confusion and agitation.    Past Medical History  Diagnosis Date  . COPD (chronic obstructive pulmonary disease)     pft's 09/15/2005, FEV1 1.69 (76%) with ration67 and better x 14% p B2, DLC 95%  . Hypertension   . Bronchitis, chronic   . Celiac disease   . Anxiety   . Pneumonia     hx of x3  . Acute blood loss anemia 09/07/13  . Anxiety state, unspecified   . Bronchiectasis 2012  . Cerebrovascular disease, unspecified 05/26/13  . Cerebral atrophy 05/26/13   Past Surgical History  Procedure Laterality Date  . Nasal sinus surgery  years ago  . Total knee arthroplasty Bilateral [id number]  . Excision morton's neuroma Bilateral 35 years ago, 2010  . Total hip arthroplasty Right 2009    Dr. Telford Nab  . Cataract extraction Bilateral ~8 years ago  . Total hip arthroplasty Left 09/06/2013    Dr. Salli Quarry   Social History:   reports that she quit smoking  about 2 years ago. Her smoking use included Cigarettes. She has a 30 pack-year smoking history. She has never used smokeless tobacco. She reports that she drinks alcohol. She reports that she does not use illicit drugs.  Family History  Problem Relation Age of Onset  . Heart disease Father 67    heart attack  . Prostate cancer Paternal Grandfather   . Pulmonary fibrosis Brother     former smoker    Medications: Patient's Medications  New Prescriptions   No medications on file  Previous Medications   ACETAMINOPHEN (TYLENOL) 650 MG  CR TABLET    Take 1,300 mg by mouth every 8 (eight) hours as needed for pain.   ALBUTEROL (PROVENTIL HFA;VENTOLIN HFA) 108 (90 BASE) MCG/ACT INHALER    Inhale 1-2 puffs into the lungs every 6 (six) hours as needed for wheezing or shortness of breath.   ALPRAZOLAM (XANAX) 0.25 MG TABLET    Take 1 tablet (0.25 mg total) by mouth every 6 (six) hours as needed for anxiety or sleep.   APIXABAN (ELIQUIS) 5 MG TABS TABLET    Take 1 tablet (5 mg total) by mouth 2 (two) times daily.   BENZONATATE (TESSALON) 200 MG CAPSULE    Take 200 mg by mouth 3 (three) times daily as needed for cough.   BIOTIN PO    Take 1,000 mcg by mouth daily.   BUDESONIDE-FORMOTEROL (SYMBICORT) 160-4.5 MCG/ACT INHALER    Inhale 2 puffs into the lungs 2 (two) times daily.     CHOLECALCIFEROL (VITAMIN D3) 1000 UNITS CAPS    Take 1,000 Units by mouth daily.    DILTIAZEM (CARDIZEM CD) 120 MG 24 HR CAPSULE    Take 120 mg by mouth every morning.    DIPHENHYDRAMINE-ACETAMINOPHEN (TYLENOL PM) 25-500 MG TABS    Take 2 tablets by mouth at bedtime.   DIPHENHYDRAMINE-PHENYLEPHRINE 25-10 MG TABS    Take 1 tablet by mouth at bedtime as needed (for sleep and decongestant).   DOFETILIDE (TIKOSYN) 250 MCG CAPSULE    Take 1 capsule (250 mcg total) by mouth 2 (two) times daily.   GLUCOSAMINE-CHONDROITIN 500-400 MG TABLET    Take 1 tablet by mouth 2 (two) times daily.     GUAIFENESIN (MUCINEX MAXIMUM STRENGTH) 1200 MG TB12    Take 1 tablet by mouth at bedtime as needed (for cough).    LOSARTAN (COZAAR) 100 MG TABLET    Take 100 mg by mouth every evening.    METOPROLOL SUCCINATE (TOPROL-XL) 50 MG 24 HR TABLET    Take 50 mg by mouth daily. Take with or immediately following a meal.   POLYETHYL GLYCOL-PROPYL GLYCOL (SYSTANE OP)    Place 1 drop into both eyes every morning.   PREDNISONE (DELTASONE) 10 MG TABLET    Take  4 each am x 2 days,   2 each am x 2 days,  1 each am x 2 days and stop   SPIRONOLACTONE (ALDACTONE) 25 MG TABLET    Take 1 tablet (25 mg  total) by mouth daily.  Modified Medications   No medications on file  Discontinued Medications   No medications on file     Physical Exam: Filed Vitals:   12/25/14 1513  BP: 114/78  Pulse: 76  Temp: 97.6 F (36.4 C)  Resp: 18  SpO2: 94%    Physical Exam  Constitutional: She is oriented to person, place, and time. She appears well-developed and well-nourished. No distress.  HENT:  Head: Normocephalic and atraumatic.  Mouth/Throat:  Oropharynx is clear and moist. No oropharyngeal exudate.  Eyes: Conjunctivae are normal. Pupils are equal, round, and reactive to light. Right eye exhibits no discharge. Left eye exhibits no discharge.  Neck: Normal range of motion. Neck supple. No JVD present. No tracheal deviation present. No thyromegaly present.  Cardiovascular:  No murmur heard. Irregular rhythm, no edema  Pulmonary/Chest: Effort normal and breath sounds normal. No respiratory distress. She has no wheezes. She has no rales.  Abdominal: Soft. Bowel sounds are normal. She exhibits no distension. There is no tenderness.  Neg CVA tenderness  Musculoskeletal:  Neg SLR.  Pain with ROM to back. Mild tenderness to the lumbosacral spine. No deformity  Lymphadenopathy:    She has no cervical adenopathy.  Neurological: She is alert and oriented to person, place, and time. No cranial nerve deficit.  Skin: Skin is warm and dry. She is not diaphoretic. No erythema.  Psychiatric: She has a normal mood and affect.    Labs reviewed: Basic Metabolic Panel:  Recent Labs  05/08/14 0044  05/09/14 0349 05/10/14 0307 07/18/14 2357 07/19/14 0400 12/24/14 1210  NA 138  < > 141 139 138 136* 132*  K 4.0  < > 4.0 4.2 3.9 3.8 4.7  CL 97  < > 101 98 98 97 94*  CO2 27  < > 28 27 24 27 27   GLUCOSE 105*  < > 107* 100* 105* 111* 122*  BUN 17  < > 16 20 13 11 19   CREATININE 0.52  < > 0.55 0.69 0.50 0.47* 0.77  CALCIUM 9.2  < > 9.4 9.1 9.0 8.9 9.0  MG 2.1  --  2.2 2.2  --   --   --   PHOS  --    --   --   --   --  3.0  --   < > = values in this interval not displayed. Liver Function Tests:  Recent Labs  07/18/14 2357 07/19/14 0400 12/24/14 1210  AST 19 19 25   ALT 11 11 16   ALKPHOS 54 57 50  BILITOT 0.4 0.5 0.6  PROT 6.8 6.7 7.4  ALBUMIN 3.4* 3.3* 3.3*   No results for input(s): LIPASE, AMYLASE in the last 8760 hours. No results for input(s): AMMONIA in the last 8760 hours. CBC:  Recent Labs  07/18/14 2357 07/19/14 0400 12/24/14 1210  WBC 9.5 8.4 9.8  NEUTROABS 7.4 6.4 8.3*  HGB 11.0* 10.6* 10.2*  HCT 34.6* 33.7* 33.5*  MCV 98.0 98.5 95.7  PLT 210 200 264   TSH:  Recent Labs  05/08/14 0059 07/19/14 0400  TSH 2.470 2.180   A1C: No results found for: HGBA1C Lipid Panel: No results for input(s): CHOL, HDL, LDLCALC, TRIG, CHOLHDL, LDLDIRECT in the last 8760 hours.  Radiological Exams reviewed 12/25/2014  12/24/14 PCXR: IMPRESSION: 1. Resolved right upper lobe atypical infection. 2. Stable changes due to bronchiectasis in the lingula. 3. Stable changes of bronchiectasis and additional scarring or infection in the right lower lung zone.  Assessment/Plan  1) Low back pain s/p fall Moderate to severe injury preventing her from performing her typical routine at home. Will check lumbosacral xray to rule out fx. If this is a muscle strain she would benefit from NSAIDS, however she is on Eliquis.  She should continue heat as needed and Tylenol for pain as we await these results. Her situation is complicated by a febrile illness  2) Febrile illness Continue to monitor VS and temp. Encourage fluids. If she continues with  a fever will pan culture and give empiric therapy. This may be a viral illness that will resolve, she will need to be monitored closely due to her hx of lung disease.   3) COPD GOLD II Stable, continue current medications.   Cindi Carbon, ANP [facility] [phone]

## 2014-12-26 ENCOUNTER — Encounter: Payer: Self-pay | Admitting: Internal Medicine

## 2014-12-26 ENCOUNTER — Non-Acute Institutional Stay (SKILLED_NURSING_FACILITY): Payer: Medicare Other | Admitting: Internal Medicine

## 2014-12-26 ENCOUNTER — Ambulatory Visit: Payer: Medicare Other

## 2014-12-26 DIAGNOSIS — J470 Bronchiectasis with acute lower respiratory infection: Secondary | ICD-10-CM | POA: Diagnosis not present

## 2014-12-26 DIAGNOSIS — M545 Low back pain, unspecified: Secondary | ICD-10-CM

## 2014-12-26 DIAGNOSIS — S32030A Wedge compression fracture of third lumbar vertebra, initial encounter for closed fracture: Secondary | ICD-10-CM | POA: Diagnosis not present

## 2014-12-26 DIAGNOSIS — I480 Paroxysmal atrial fibrillation: Secondary | ICD-10-CM

## 2014-12-26 DIAGNOSIS — R509 Fever, unspecified: Secondary | ICD-10-CM

## 2014-12-26 DIAGNOSIS — J449 Chronic obstructive pulmonary disease, unspecified: Secondary | ICD-10-CM

## 2014-12-26 DIAGNOSIS — I10 Essential (primary) hypertension: Secondary | ICD-10-CM | POA: Diagnosis not present

## 2014-12-26 DIAGNOSIS — Z7901 Long term (current) use of anticoagulants: Secondary | ICD-10-CM | POA: Diagnosis not present

## 2014-12-26 NOTE — Progress Notes (Signed)
Patient ID: [name] [name], female   DOB: 1931-06-18, 81 y.o.   MRN: [id number]  Provider:  Rexene Edison. Mariea Clonts, D.O., C.M.D. Location:  [facility]  PCP: Osborne Casco, MD  Code Status: full code Goals of Care: Advanced Directive information Does patient have an advance directive?: Yes, Type of Advance Directive: Living will;Healthcare Power of Attorney, Does patient want to make changes to advanced directive?: No - Patient declined   Chief Complaint  Patient presents with  . New Admit To SNF    admitted for rehab, back pain mgt, acute bronchitis resolving    HPI: 81 y.o. female independent living resident with h/o paroxysmal afib, bronchiectasis, COPD, anxiety was admitted to [facility] due to recent declining functional status.  She had been treated in early May for an acute bronchitis by her primary care practice (completed doxycycline) 10 days before Sunday.  At the end of last week, she developed fever, chills, shakes and episodes of sweating.  At home on Sunday, she sustained a fall and was noted to have a temp of 102, and went to the ED.  There, she had a chest xray showing resolution of the "atypical pneumonia" read on her CT chest from 11/27/14.  She was c/o of back pain, but had no tenderness at the ED and no imaging was done there.  She had a negative UA and her wbc was wnl, but did show a slight left shift.  She was felt to have had a viral illness.  She was very weak and unable to take care of herself at home due to this and her back pain.  She was admitted to [facility].    She continues to have sweats and fevers.  The sweats completely soak her hair and clothes.  She feels terrible, anxious, and extremely weak--it's hard to sit up due to back pain and weakness.  She is not eating well--able to eat breakfast but very little at other meals.  She is self-administering her meds and is alert and oriented x 3.  Her lungs have remained clear.  Her sats are 92%.  She  is now receiving PT for her back pain and tylenol for pain.  She is using her incentive spirometer.  When she was seen by NP Melvyn Novas, xrays of her low back were performed.  Repeat cbc and bmp were done and unrevealing.  ROS: Review of Systems  Constitutional: Positive for fever, chills, malaise/fatigue and diaphoresis.  HENT: Negative for congestion.   Eyes: Negative for blurred vision.  Respiratory: Positive for sputum production. Negative for cough, hemoptysis, shortness of breath and wheezing.   Cardiovascular: Negative for chest pain and leg swelling.  Gastrointestinal: Negative for abdominal pain, diarrhea, constipation, blood in stool and melena.       Did not have bm today, but has not been eating much, had small one yesterday  Genitourinary: Negative for dysuria.  Musculoskeletal: Positive for back pain and falls.       One fall at home before rehab admission  Skin: Negative for itching and rash.  Neurological: Positive for weakness. Negative for dizziness and loss of consciousness.  Endo/Heme/Allergies: Does not bruise/bleed easily.  Psychiatric/Behavioral: Negative for depression and memory loss. The patient is nervous/anxious. The patient does not have insomnia.     Past Medical History  Diagnosis Date  . COPD (chronic obstructive pulmonary disease)     pft's 09/15/2005, FEV1 1.69 (76%) with ration67 and better x 14% p B2, DLC 95%  .  Hypertension   . Bronchitis, chronic   . Celiac disease   . Anxiety   . Pneumonia     hx of x3  . Acute blood loss anemia 09/07/13  . Anxiety state, unspecified   . Bronchiectasis 2012  . Cerebrovascular disease, unspecified 05/26/13  . Cerebral atrophy 05/26/13   Past Surgical History  Procedure Laterality Date  . Nasal sinus surgery  years ago  . Total knee arthroplasty Bilateral [id number]  . Excision morton's neuroma Bilateral 35 years ago, 2010  . Total hip arthroplasty Right 2009    Dr. Telford Nab  . Cataract extraction Bilateral ~8  years ago  . Total hip arthroplasty Left 09/06/2013    Dr. Salli Quarry   Social History:   reports that she quit smoking about 2 years ago. Her smoking use included Cigarettes. She has a 30 pack-year smoking history. She has never used smokeless tobacco. She reports that she drinks alcohol. She reports that she does not use illicit drugs.  Family History  Problem Relation Age of Onset  . Heart disease Father 4    heart attack  . Prostate cancer Paternal Grandfather   . Pulmonary fibrosis Brother     former smoker    Allergies  Allergen Reactions  . Benicar [Olmesartan] Diarrhea and Other (See Comments)    Hypotension  . Metronidazole Nausea Only  . Morphine Hives    Medications: Patient's Medications  New Prescriptions   No medications on file  Previous Medications   ACETAMINOPHEN (TYLENOL) 650 MG CR TABLET    Take 1,300 mg by mouth every 8 (eight) hours as needed for pain.   ALBUTEROL (PROVENTIL HFA;VENTOLIN HFA) 108 (90 BASE) MCG/ACT INHALER    Inhale 1-2 puffs into the lungs every 6 (six) hours as needed for wheezing or shortness of breath.   ALPRAZOLAM (XANAX) 0.25 MG TABLET    Take 1 tablet (0.25 mg total) by mouth every 6 (six) hours as needed for anxiety or sleep.   APIXABAN (ELIQUIS) 5 MG TABS TABLET    Take 1 tablet (5 mg total) by mouth 2 (two) times daily.   BENZONATATE (TESSALON) 200 MG CAPSULE    Take 200 mg by mouth 3 (three) times daily as needed for cough.   BIOTIN PO    Take 1,000 mcg by mouth daily.   BUDESONIDE-FORMOTEROL (SYMBICORT) 160-4.5 MCG/ACT INHALER    Inhale 2 puffs into the lungs 2 (two) times daily.     CHOLECALCIFEROL (VITAMIN D3) 1000 UNITS CAPS    Take 1,000 Units by mouth daily.    DILTIAZEM (CARDIZEM CD) 120 MG 24 HR CAPSULE    Take 120 mg by mouth every morning.    DIPHENHYDRAMINE-ACETAMINOPHEN (TYLENOL PM) 25-500 MG TABS    Take 2 tablets by mouth at bedtime.   DIPHENHYDRAMINE-PHENYLEPHRINE 25-10 MG TABS    Take 1 tablet by mouth at bedtime as  needed (for sleep and decongestant).   DOFETILIDE (TIKOSYN) 250 MCG CAPSULE    Take 1 capsule (250 mcg total) by mouth 2 (two) times daily.   GLUCOSAMINE-CHONDROITIN 500-400 MG TABLET    Take 1 tablet by mouth 2 (two) times daily.     GUAIFENESIN (MUCINEX MAXIMUM STRENGTH) 1200 MG TB12    Take 1 tablet by mouth at bedtime as needed (for cough).    LOSARTAN (COZAAR) 100 MG TABLET    Take 100 mg by mouth every evening.    METOPROLOL SUCCINATE (TOPROL-XL) 50 MG 24 HR TABLET    Take 50 mg  by mouth daily. Take with or immediately following a meal.   POLYETHYL GLYCOL-PROPYL GLYCOL (SYSTANE OP)    Place 1 drop into both eyes every morning.   PREDNISONE (DELTASONE) 10 MG TABLET    Take  4 each am x 2 days,   2 each am x 2 days,  1 each am x 2 days and stop   SPIRONOLACTONE (ALDACTONE) 25 MG TABLET    Take 1 tablet (25 mg total) by mouth daily.  Modified Medications   No medications on file  Discontinued Medications   No medications on file     Physical Exam: Filed Vitals:   12/26/14 1015  BP: 136/91  Pulse: 79  Temp: 97.9 F (36.6 C)  Resp: 20  Height: 5\' 7"  (1.702 m)  Weight: 188 lb 6.4 oz (85.458 kg)  SpO2: 92%   Body mass index is 29.5 kg/(m^2). Physical Exam  Constitutional: She is oriented to person, place, and time. She appears well-developed and well-nourished. No distress.  HENT:  Head: Normocephalic and atraumatic.  Cardiovascular: Normal rate, regular rhythm, normal heart sounds and intact distal pulses.   Pulmonary/Chest: Effort normal and breath sounds normal. No respiratory distress. She has no wheezes. She has no rales.  Abdominal: Soft. Bowel sounds are normal. She exhibits no distension and no mass. There is no tenderness.  Musculoskeletal: Normal range of motion.  Neurological: She is alert and oriented to person, place, and time.  Skin: Skin is warm. She is diaphoretic.  Psychiatric: She has a normal mood and affect. Her behavior is normal. Judgment and thought  content normal.     Labs reviewed: Basic Metabolic Panel:  Recent Labs  05/08/14 0044  05/09/14 0349 05/10/14 0307 07/18/14 2357 07/19/14 0400 12/24/14 1210  NA 138  < > 141 139 138 136* 132*  K 4.0  < > 4.0 4.2 3.9 3.8 4.7  CL 97  < > 101 98 98 97 94*  CO2 27  < > 28 27 24 27 27   GLUCOSE 105*  < > 107* 100* 105* 111* 122*  BUN 17  < > 16 20 13 11 19   CREATININE 0.52  < > 0.55 0.69 0.50 0.47* 0.77  CALCIUM 9.2  < > 9.4 9.1 9.0 8.9 9.0  MG 2.1  --  2.2 2.2  --   --   --   PHOS  --   --   --   --   --  3.0  --   < > = values in this interval not displayed. Liver Function Tests:  Recent Labs  07/18/14 2357 07/19/14 0400 12/24/14 1210  AST 19 19 25   ALT 11 11 16   ALKPHOS 54 57 50  BILITOT 0.4 0.5 0.6  PROT 6.8 6.7 7.4  ALBUMIN 3.4* 3.3* 3.3*   No results for input(s): LIPASE, AMYLASE in the last 8760 hours. No results for input(s): AMMONIA in the last 8760 hours. CBC:  Recent Labs  07/18/14 2357 07/19/14 0400 12/24/14 1210  WBC 9.5 8.4 9.8  NEUTROABS 7.4 6.4 8.3*  HGB 11.0* 10.6* 10.2*  HCT 34.6* 33.7* 33.5*  MCV 98.0 98.5 95.7  PLT 210 200 264   Cardiac Enzymes:  Recent Labs  07/19/14 0400 07/19/14 0946 07/19/14 1409  TROPONINI <0.30 <0.30 <0.30   BNP: Invalid input(s): POCBNP  No results found for: HGBA1C Lab Results  Component Value Date   TSH 2.180 07/19/2014   Imaging and Procedures obtained prior to SNF admission: CT chest w/o contrast 11/27/14:  1. Ascending thoracic aortic aneurysm measuring 4.6 cm in diameter, slightly increased from prior study (4.4 cm in diameter on prior). Recommend semi-annual imaging followup by CTA or MRA and referral to cardiothoracic surgery if not already obtained. This recommendation follows 2010 ACCF/AHA/AATS/ACR/ASA/SCA/SCAI/SIR/STS/SVM Guidelines for the Diagnosis and Management of Patients With Thoracic Aortic Disease. Circulation. 2010; 121: N170-Y174. 2. Increasing mediastinal lymphadenopathy, favored  to be reactive. There is a spectrum of findings in the lungs since suggestive of a progressive chronic indolent atypical infectious process such asmycobacterium avium intracellulare (MAI). Clinical correlation is recommended. 3. Coronary artery atherosclerosis, including left main and 3 vessel coronary artery disease.  CXR 12/24/14:  1. Resolved right upper lobe atypical infection. 2. Stable changes due to bronchiectasis in the lingula. 3. Stable changes of bronchiectasis and additional scarring or infection in the right lower lung zone.  Lumbar xrays 12/25/14:  Degenerative disc disease and L3 compression fracture felt to be acute  Patient Care Team: Kelton Pillar, MD as PCP - General (Family Medicine) [facility] Paralee Cancel, MD as Consulting Physician (Orthopedic Surgery) Jacolyn Reedy, MD as Consulting Physician (Cardiology)  Assessment/Plan 1. Compression fracture of L3 lumbar vertebra, closed, initial encounter -xray with new acute L3 compression fracture sustained during her fall -has increased pain when trying to shift positions, sit up, and too weak at present from infection to participate in therapy at present -has been started on tylenol for this  2. Bilateral low back pain without sciatica -due to #1, is not reproducible locally at L3, but internal pain  3. Febrile illness -unclear if this is truly an atypical pneumonia but she's been having symptoms for several weeks, getting weaker, more fatigued and now having high fevers and sweats that seem out of proportion for her clear lung sounds and sats of 92% plus, minimal cough with sputum production -sputum culture, repeat cbc with diff, bmp, blood cxs x 2 ordered pre-abx -begin levaquin 750mg  daily for 10 days and florastor 250mg  po bid for 14 days to prevent abx-associated diarrhea from it -also will give IVF if bmp returns suggestive of dehydration or sodium continues to trend downward -monitor temps  and intake, encourage po fluids until repeat labs return  4. COPD GOLD II -follows with Dr. Christinia Gully -continues on symbicort, albuterol  5. Bronchiectasis with acute lower respiratory infection -seems she does have a respiratory infection which may be atypical pneumonia, but ED CXR was negative which should be a better image than portable that we can here  6. Essential hypertension -bp has been fluctuating between normal and elevated, but has not been hypotensive; continues on cozaar, spironolactone, cardizem, toprol  7. Paroxysmal atrial fibrillation -continues on toprol xl for rate control and cardizem and tikosyn for rhythm control--RRR on exam; also is on eliquis  8. Current use of long term anticoagulation -continues on eliquis therapy w/o complications  Functional status:  Too weak to ambulate at present so dependent in adls except feeding  Family/ staff Communication: discussed with rehab nurse and nurse manager as well as NP Wert  Labs/tests ordered:  Cbc, bmp, blood cultures x 2, sputum culture stat   Anikah Hogge L. Lynnmarie Lovett, D.O. [facility] [address], [place] [address] Cell Phone (Mon-Fri 8am-5pm):  [phone] On Call:  [phone] & follow prompts after 5pm & weekends Office Phone:  [phone] Office Fax:  [phone]

## 2014-12-27 ENCOUNTER — Non-Acute Institutional Stay (SKILLED_NURSING_FACILITY): Payer: Medicare Other | Admitting: Internal Medicine

## 2014-12-27 ENCOUNTER — Encounter: Payer: Self-pay | Admitting: Internal Medicine

## 2014-12-27 DIAGNOSIS — S32030A Wedge compression fracture of third lumbar vertebra, initial encounter for closed fracture: Secondary | ICD-10-CM | POA: Diagnosis not present

## 2014-12-27 DIAGNOSIS — J470 Bronchiectasis with acute lower respiratory infection: Secondary | ICD-10-CM | POA: Diagnosis not present

## 2014-12-27 DIAGNOSIS — G4700 Insomnia, unspecified: Secondary | ICD-10-CM | POA: Diagnosis not present

## 2014-12-27 DIAGNOSIS — R509 Fever, unspecified: Secondary | ICD-10-CM

## 2014-12-27 NOTE — Progress Notes (Signed)
Patient ID: [name] [name], female   DOB: October 13, 1930, 81 y.o.   MRN: [id number]  Location: [facility] Provider:  Rexene Edison. Mariea Clonts, D.O., C.M.D.  Code Status:  Full code--pt told me she does want to be resuscitated at this point, but does not want to be kept on a ventilator--explained that depending on cause of arrest, that might be necessary  Goals of care: Advanced Directive information Does patient have an advance directive?: Yes, Type of Advance Directive: Living will;Healthcare Power of Attorney, Does patient want to make changes to advanced directive?: No - Patient declined  Chief Complaint  Patient presents with  . Acute Visit    follow up on fevers, sweats, poor appetite    HPI:  81 yo white female with h/o Gold II COPD, bronchiectasis, anxiety, htn, CAD on CT who is here for short term rehab after a fall in the context of a respiratory infection.  She sustained a compression fracture of her L3.  She is to be getting rehab, but has been too weak to participate, po intake is down.  Yesterday, I started her on levaquin for possible atypical infection after her labwork was unrevealing (wbc normal, but left shift persists).  Her Tmax since then is 99.9 and she had one single episode of sweats.  She was afebrile this am.  She notes she did not sleep well last night after she didn't take prn xanax.  She did take tylenol pm but it was ineffective (but may have lowered her temp).  Her back is hurting more today and she asks about treatment for this.  Initial blood cultures negative and sputum culture is showing a variety of organisms so far (probably contaminated).  Her Na level had returned at 129 so I gave her a liter of NS.  She says this seems to have helped her to feel a little better.    Review of Systems:  Review of Systems  Constitutional: Positive for fever, chills, malaise/fatigue and diaphoresis.  HENT: Negative for congestion.   Respiratory: Positive for cough.   Cardiovascular:  Negative for chest pain.  Gastrointestinal: Negative for nausea, abdominal pain and constipation.       Had a bm  Genitourinary: Negative for dysuria, urgency and frequency.  Musculoskeletal: Positive for back pain and falls.  Neurological: Positive for weakness. Negative for dizziness.  Psychiatric/Behavioral: Negative for memory loss. The patient is nervous/anxious and has insomnia.     Past Medical History  Diagnosis Date  . COPD (chronic obstructive pulmonary disease)     pft's 09/15/2005, FEV1 1.69 (76%) with ration67 and better x 14% p B2, DLC 95%  . Hypertension   . Bronchitis, chronic   . Celiac disease   . Anxiety   . Pneumonia     hx of x3  . Acute blood loss anemia 09/07/13  . Anxiety state, unspecified   . Bronchiectasis 2012  . Cerebrovascular disease, unspecified 05/26/13  . Cerebral atrophy 05/26/13    Patient Active Problem List   Diagnosis Date Noted  . Dizziness 07/19/2014  . Current use of long term anticoagulation 05/10/2014  . Paroxysmal atrial fibrillation 05/07/2014  . CAD (coronary artery disease), native coronary artery   . Ascending aortic aneurysm   . Overweight (BMI 25.0-29.9) 09/07/2013  . Bronchiectasis 06/05/2013  . COPD GOLD II 07/09/2007  . Essential hypertension     Allergies  Allergen Reactions  . Benicar [Olmesartan] Diarrhea and Other (See Comments)    Hypotension  . Metronidazole Nausea  Only  . Morphine Hives    Medications: Patient's Medications  New Prescriptions   No medications on file  Previous Medications   ACETAMINOPHEN (TYLENOL) 650 MG CR TABLET    Take 1,300 mg by mouth every 8 (eight) hours as needed for pain.   ALBUTEROL (PROVENTIL HFA;VENTOLIN HFA) 108 (90 BASE) MCG/ACT INHALER    Inhale 1-2 puffs into the lungs every 6 (six) hours as needed for wheezing or shortness of breath.   ALPRAZOLAM (XANAX) 0.25 MG TABLET    Take 1 tablet (0.25 mg total) by mouth every 6 (six) hours as needed for anxiety or sleep.   APIXABAN  (ELIQUIS) 5 MG TABS TABLET    Take 1 tablet (5 mg total) by mouth 2 (two) times daily.   BENZONATATE (TESSALON) 200 MG CAPSULE    Take 200 mg by mouth 3 (three) times daily as needed for cough.   BIOTIN PO    Take 1,000 mcg by mouth daily.   BUDESONIDE-FORMOTEROL (SYMBICORT) 160-4.5 MCG/ACT INHALER    Inhale 2 puffs into the lungs 2 (two) times daily.     CHOLECALCIFEROL (VITAMIN D3) 1000 UNITS CAPS    Take 1,000 Units by mouth daily.    DILTIAZEM (CARDIZEM CD) 120 MG 24 HR CAPSULE    Take 120 mg by mouth every morning.    DIPHENHYDRAMINE-ACETAMINOPHEN (TYLENOL PM) 25-500 MG TABS    Take 2 tablets by mouth at bedtime.   DIPHENHYDRAMINE-PHENYLEPHRINE 25-10 MG TABS    Take 1 tablet by mouth at bedtime as needed (for sleep and decongestant).   DOFETILIDE (TIKOSYN) 250 MCG CAPSULE    Take 1 capsule (250 mcg total) by mouth 2 (two) times daily.   GLUCOSAMINE-CHONDROITIN 500-400 MG TABLET    Take 1 tablet by mouth 2 (two) times daily.     GUAIFENESIN (MUCINEX MAXIMUM STRENGTH) 1200 MG TB12    Take 1 tablet by mouth at bedtime as needed (for cough).    LOSARTAN (COZAAR) 100 MG TABLET    Take 100 mg by mouth every evening.    METOPROLOL SUCCINATE (TOPROL-XL) 50 MG 24 HR TABLET    Take 50 mg by mouth daily. Take with or immediately following a meal.   POLYETHYL GLYCOL-PROPYL GLYCOL (SYSTANE OP)    Place 1 drop into both eyes every morning.   SPIRONOLACTONE (ALDACTONE) 25 MG TABLET    Take 1 tablet (25 mg total) by mouth daily.  Modified Medications   No medications on file  Discontinued Medications   No medications on file    Physical Exam: Filed Vitals:   12/27/14 0937  BP: 114/72  Pulse: 82  Temp: 98 F (36.7 C)  Resp: 20  Height: 5\' 7"  (1.702 m)  Weight: 172 lb 6.4 oz (78.2 kg)  SpO2: 91%   Body mass index is 27 kg/(m^2).  Physical Exam  Constitutional: She is oriented to person, place, and time. She appears well-developed and well-nourished. No distress.  Cardiovascular: Normal rate,  regular rhythm, normal heart sounds and intact distal pulses.   Pulmonary/Chest: Effort normal and breath sounds normal. No respiratory distress.  Abdominal: Soft. Bowel sounds are normal. She exhibits no distension and no mass. There is no tenderness.  Musculoskeletal: Normal range of motion.  Difficulty sitting up again today, got pain in her lower back when she coughed  Neurological: She is alert and oriented to person, place, and time.  Psychiatric: She has a normal mood and affect.    Labs reviewed: Basic Metabolic Panel:  Recent  Labs  05/08/14 0044  05/09/14 0349 05/10/14 0307 07/18/14 2357 07/19/14 0400 12/24/14 1210  NA 138  < > 141 139 138 136* 132*  K 4.0  < > 4.0 4.2 3.9 3.8 4.7  CL 97  < > 101 98 98 97 94*  CO2 27  < > 28 27 24 27 27   GLUCOSE 105*  < > 107* 100* 105* 111* 122*  BUN 17  < > 16 20 13 11 19   CREATININE 0.52  < > 0.55 0.69 0.50 0.47* 0.77  CALCIUM 9.2  < > 9.4 9.1 9.0 8.9 9.0  MG 2.1  --  2.2 2.2  --   --   --   PHOS  --   --   --   --   --  3.0  --   < > = values in this interval not displayed.  Liver Function Tests:  Recent Labs  07/18/14 2357 07/19/14 0400 12/24/14 1210  AST 19 19 25   ALT 11 11 16   ALKPHOS 54 57 50  BILITOT 0.4 0.5 0.6  PROT 6.8 6.7 7.4  ALBUMIN 3.4* 3.3* 3.3*    CBC:  Recent Labs  07/18/14 2357 07/19/14 0400 12/24/14 1210  WBC 9.5 8.4 9.8  NEUTROABS 7.4 6.4 8.3*  HGB 11.0* 10.6* 10.2*  HCT 34.6* 33.7* 33.5*  MCV 98.0 98.5 95.7  PLT 210 200 264    Lab Results  Component Value Date   TSH 2.180 07/19/2014   No results found for: HGBA1C No results found for: CHOL, HDL, LDLCALC, LDLDIRECT, TRIG, CHOLHDL 12/25/14:  Wbc 9.8, Na 132 12/26/14:  Wbc 9.8, Na 129  Patient Care Team: Kelton Pillar, MD as PCP - General (Family Medicine) [facility] Paralee Cancel, MD as Consulting Physician (Orthopedic Surgery) Jacolyn Reedy, MD as Consulting Physician (Cardiology)  Assessment/Plan 1.  Compression fracture of L3 lumbar vertebra, closed, initial encounter -will add tramadol temporarily for pain in the hopes it will help her do therapy--1 q 4 hrs prn pain while awake  2. Febrile illness -afebrile this am and had only one episode of sweats last night since her levaquin was started, but also had gotten tylenol pm which I will stop so we can tell for sure when she has fevers  3. Bronchiectasis with acute lower respiratory infection -cont levaquin for 10 days and f/u with Dr. Melvyn Novas in 2 wks when abx completed  4. Insomnia -d/c tylenol pm -has prn xanax she has used at home very low dose and her insomnia does seem to be due to anxiety so advised she may use this tonight--she is aware of the risks of this medication in her age group, and only uses it rarely  Pharmacist, hospital Communication: discussed with rehab nurse and nurse manager  Labs/tests ordered:  Repeat bmp on 5/27 to reassess Na  Mannix Kroeker L. Marcell Chavarin, D.O. [facility] [address], [place] [address] Cell Phone (Mon-Fri 8am-5pm):  [phone] On Call:  [phone] & follow prompts after 5pm & weekends Office Phone:  [phone] Office Fax:  [phone]

## 2014-12-29 LAB — BASIC METABOLIC PANEL
BUN: 21 mg/dL (ref 4–21)
Creatinine: 0.6 mg/dL (ref 0.5–1.1)
GLUCOSE: 112 mg/dL
POTASSIUM: 4.5 mmol/L (ref 3.4–5.3)
SODIUM: 131 mmol/L — AB (ref 137–147)

## 2015-01-02 ENCOUNTER — Encounter: Payer: Self-pay | Admitting: Adult Health

## 2015-01-02 ENCOUNTER — Non-Acute Institutional Stay (SKILLED_NURSING_FACILITY): Payer: Medicare Other | Admitting: Adult Health

## 2015-01-02 ENCOUNTER — Ambulatory Visit: Payer: Self-pay | Admitting: Podiatry

## 2015-01-02 DIAGNOSIS — J470 Bronchiectasis with acute lower respiratory infection: Secondary | ICD-10-CM | POA: Diagnosis not present

## 2015-01-02 DIAGNOSIS — S32000D Wedge compression fracture of unspecified lumbar vertebra, subsequent encounter for fracture with routine healing: Secondary | ICD-10-CM | POA: Diagnosis not present

## 2015-01-02 DIAGNOSIS — I9589 Other hypotension: Secondary | ICD-10-CM | POA: Diagnosis not present

## 2015-01-02 NOTE — Progress Notes (Signed)
Patient ID: [name] [name], female   DOB: Apr 12, 1931, 81 y.o.   MRN: [id number]    Nursing Home Location:  [facility]   Place of Service: SNF (31)    Allergies  Allergen Reactions  . Benicar [Olmesartan] Diarrhea and Other (See Comments)    Hypotension  . Metronidazole Nausea Only  . Morphine Hives    Chief Complaint  Patient presents with  . Acute Visit    low bp    HPI:  Patient is a 81 y.o. female seen today at [facility] in the rehab section seen today due to low BP ranging 81-117/63-74. She was admitted to the rehab on 12/25/14.   She previously lived independently and had a fall on 12/23/14 and sustained an L3 fracture. She continues to report pain and is using one tab of Ultram 50 mg nightly. Due to her weakness from low BP she is not working with PT today.  She was seen in the ER on 5/22 due to weakness and a fever of 102.  Her CXR showed a resolving pneumonia in the ER but she was placed on Levaquin on 5/25 due to continued fever, cough, and chills. Blood cultures were negative. She has since that time had a decrease in cough and sputum production. She has been afrebrile and on room air.  She has a hx of COPD and bronchiectasis. She had a sodium of 129 on 5/25 and received 1L of fluid. Her sodium improved to 131 on 5/27 with other values WNL. She reports that she is eating and drinking well and denies dizziness. She is concerned about her BP and would like input from Dr. Wynonia Lawman with cardiology.   Review of Systems:  Review of Systems  Constitutional: Positive for activity change and fatigue. Negative for fever, chills, diaphoresis, appetite change and unexpected weight change.  HENT: Negative for congestion, ear pain, facial swelling, nosebleeds, postnasal drip, rhinorrhea, sore throat and trouble swallowing.   Eyes: Negative for discharge and itching.  Respiratory: Positive for cough. Negative for chest tightness, shortness of breath,  wheezing and stridor.   Cardiovascular: Negative for chest pain, palpitations and leg swelling.  Gastrointestinal: Negative for abdominal pain, diarrhea, constipation and abdominal distention.  Genitourinary: Negative for dysuria and flank pain.  Musculoskeletal: Positive for back pain and gait problem. Negative for arthralgias.  Neurological: Positive for weakness. Negative for dizziness, seizures, syncope and speech difficulty.  Psychiatric/Behavioral: Negative for confusion and agitation.    Past Medical History  Diagnosis Date  . COPD (chronic obstructive pulmonary disease)     pft's 09/15/2005, FEV1 1.69 (76%) with ration67 and better x 14% p B2, DLC 95%  . Hypertension   . Bronchitis, chronic   . Celiac disease   . Anxiety   . Pneumonia     hx of x3  . Acute blood loss anemia 09/07/13  . Anxiety state, unspecified   . Bronchiectasis 2012  . Cerebrovascular disease, unspecified 05/26/13  . Cerebral atrophy 05/26/13   Past Surgical History  Procedure Laterality Date  . Nasal sinus surgery  years ago  . Total knee arthroplasty Bilateral [id number]  . Excision morton's neuroma Bilateral 35 years ago, 2010  . Total hip arthroplasty Right 2009    Dr. Telford Nab  . Cataract extraction Bilateral ~8 years ago  . Total hip arthroplasty Left 09/06/2013    Dr. Salli Quarry   Social History:   reports that she quit smoking about 2 years ago. Her smoking use included Cigarettes.  She has a 30 pack-year smoking history. She has never used smokeless tobacco. She reports that she drinks alcohol. She reports that she does not use illicit drugs.  Family History  Problem Relation Age of Onset  . Heart disease Father 57    heart attack  . Prostate cancer Paternal Grandfather   . Pulmonary fibrosis Brother     former smoker    Medications: Patient's Medications  New Prescriptions   No medications on file  Previous Medications   ACETAMINOPHEN (TYLENOL) 650 MG CR TABLET    Take 1,300 mg by mouth  every 8 (eight) hours as needed for pain.   ALBUTEROL (PROVENTIL HFA;VENTOLIN HFA) 108 (90 BASE) MCG/ACT INHALER    Inhale 1-2 puffs into the lungs every 6 (six) hours as needed for wheezing or shortness of breath.   ALPRAZOLAM (XANAX) 0.25 MG TABLET    Take 1 tablet (0.25 mg total) by mouth every 6 (six) hours as needed for anxiety or sleep.   APIXABAN (ELIQUIS) 5 MG TABS TABLET    Take 1 tablet (5 mg total) by mouth 2 (two) times daily.   BENZONATATE (TESSALON) 200 MG CAPSULE    Take 200 mg by mouth 3 (three) times daily as needed for cough.   BIOTIN PO    Take 1,000 mcg by mouth daily.   BUDESONIDE-FORMOTEROL (SYMBICORT) 160-4.5 MCG/ACT INHALER    Inhale 2 puffs into the lungs 2 (two) times daily.     CHOLECALCIFEROL (VITAMIN D3) 1000 UNITS CAPS    Take 1,000 Units by mouth daily.    DILTIAZEM (CARDIZEM CD) 120 MG 24 HR CAPSULE    Take 120 mg by mouth every morning.    DIPHENHYDRAMINE-ACETAMINOPHEN (TYLENOL PM) 25-500 MG TABS    Take 2 tablets by mouth at bedtime.   DIPHENHYDRAMINE-PHENYLEPHRINE 25-10 MG TABS    Take 1 tablet by mouth at bedtime as needed (for sleep and decongestant).   DOFETILIDE (TIKOSYN) 250 MCG CAPSULE    Take 1 capsule (250 mcg total) by mouth 2 (two) times daily.   GLUCOSAMINE-CHONDROITIN 500-400 MG TABLET    Take 1 tablet by mouth 2 (two) times daily.     GUAIFENESIN (MUCINEX MAXIMUM STRENGTH) 1200 MG TB12    Take 1 tablet by mouth at bedtime as needed (for cough).    LOSARTAN (COZAAR) 100 MG TABLET    Take 100 mg by mouth every evening.    METOPROLOL SUCCINATE (TOPROL-XL) 50 MG 24 HR TABLET    Take 50 mg by mouth daily. Take with or immediately following a meal.   POLYETHYL GLYCOL-PROPYL GLYCOL (SYSTANE OP)    Place 1 drop into both eyes every morning.   SPIRONOLACTONE (ALDACTONE) 25 MG TABLET    Take 1 tablet (25 mg total) by mouth daily.   TRAMADOL (ULTRAM) 50 MG TABLET    Take 50 mg by mouth every 6 (six) hours as needed.  Modified Medications   No medications on  file  Discontinued Medications   No medications on file     Physical Exam: Filed Vitals:   01/02/15 1208  BP: 89/68  Pulse: 98  Temp: 97.2 F (36.2 C)  Resp: 20  Weight: 171 lb (77.565 kg)  SpO2: 96%    Physical Exam  Constitutional: She is oriented to person, place, and time. She appears well-developed and well-nourished. No distress.  HENT:  Head: Normocephalic and atraumatic.  Mouth/Throat: Oropharynx is clear and moist. No oropharyngeal exudate.  Eyes: Conjunctivae are normal. Pupils are equal, round,  and reactive to light. Right eye exhibits no discharge. Left eye exhibits no discharge.  Neck: Normal range of motion. Neck supple. No JVD present. No tracheal deviation present. No thyromegaly present.  Cardiovascular:  No murmur heard. Irregular rhythm, no edema  Pulmonary/Chest: Effort normal and breath sounds normal. No respiratory distress. She has no wheezes. She has no rales.  Abdominal: Soft. Bowel sounds are normal. She exhibits no distension. There is no tenderness.  Neg CVA tenderness  Musculoskeletal:  Neg SLR.  Pain with ROM to back. Mild tenderness to the lumbosacral spine. No deformity  Lymphadenopathy:    She has no cervical adenopathy.  Neurological: She is alert and oriented to person, place, and time. No cranial nerve deficit.  Skin: Skin is warm and dry. She is not diaphoretic. No erythema.  Psychiatric: She has a normal mood and affect.    Labs reviewed: Basic Metabolic Panel:  Recent Labs  05/08/14 0044  05/09/14 0349 05/10/14 0307 07/18/14 2357 07/19/14 0400 12/24/14 1210 12/29/14  NA 138  < > 141 139 138 136* 132* 131*  K 4.0  < > 4.0 4.2 3.9 3.8 4.7 4.5  CL 97  < > 101 98 98 97 94*  --   CO2 27  < > 28 27 24 27 27   --   GLUCOSE 105*  < > 107* 100* 105* 111* 122*  --   BUN 17  < > 16 20 13 11 19 21   CREATININE 0.52  < > 0.55 0.69 0.50 0.47* 0.77 0.6  CALCIUM 9.2  < > 9.4 9.1 9.0 8.9 9.0  --   MG 2.1  --  2.2 2.2  --   --   --   --     PHOS  --   --   --   --   --  3.0  --   --   < > = values in this interval not displayed. Liver Function Tests:  Recent Labs  07/18/14 2357 07/19/14 0400 12/24/14 1210  AST 19 19 25   ALT 11 11 16   ALKPHOS 54 57 50  BILITOT 0.4 0.5 0.6  PROT 6.8 6.7 7.4  ALBUMIN 3.4* 3.3* 3.3*   No results for input(s): LIPASE, AMYLASE in the last 8760 hours. No results for input(s): AMMONIA in the last 8760 hours. CBC:  Recent Labs  07/18/14 2357 07/19/14 0400 12/24/14 1210  WBC 9.5 8.4 9.8  NEUTROABS 7.4 6.4 8.3*  HGB 11.0* 10.6* 10.2*  HCT 34.6* 33.7* 33.5*  MCV 98.0 98.5 95.7  PLT 210 200 264   TSH:  Recent Labs  05/08/14 0059 07/19/14 0400  TSH 2.470 2.180   A1C: No results found for: HGBA1C Lipid Panel: No results for input(s): CHOL, HDL, LDLCALC, TRIG, CHOLHDL, LDLDIRECT in the last 8760 hours.  Radiological Exams reviewed 01/02/2015  12/24/14 PCXR: IMPRESSION: 1. Resolved right upper lobe atypical infection. 2. Stable changes due to bronchiectasis in the lingula. 3. Stable changes of bronchiectasis and additional scarring or infection in the right lower lung zone.  Assessment/Plan  1. Other specified hypotension Hold losartan, metoprolol, and Diltiazem for SBP <95. Recheck BMP to evaluate further need for IVF. Encourage oral intake. Await input from cardiology per pt request  2. Bronchiectasis with acute lower respiratory infection Improved. Continue Levaquin and f/u with pulmonary  3. Lumbar compression fracture, with routine healing, subsequent encounter No improvement in pain. Continue ultram prn and PT if tolerated when BP improves.    Cindi Carbon, [place]  [facility] [phone]

## 2015-01-05 LAB — BASIC METABOLIC PANEL
BUN: 24 mg/dL — AB (ref 4–21)
Creatinine: 0.8 mg/dL (ref 0.5–1.1)
Glucose: 116 mg/dL
POTASSIUM: 4.2 mmol/L (ref 3.4–5.3)
Sodium: 134 mmol/L — AB (ref 137–147)

## 2015-01-08 ENCOUNTER — Non-Acute Institutional Stay (SKILLED_NURSING_FACILITY): Payer: Medicare Other | Admitting: Adult Health

## 2015-01-08 DIAGNOSIS — I10 Essential (primary) hypertension: Secondary | ICD-10-CM

## 2015-01-08 DIAGNOSIS — K5901 Slow transit constipation: Secondary | ICD-10-CM | POA: Diagnosis not present

## 2015-01-08 DIAGNOSIS — S32000A Wedge compression fracture of unspecified lumbar vertebra, initial encounter for closed fracture: Secondary | ICD-10-CM

## 2015-01-08 DIAGNOSIS — I480 Paroxysmal atrial fibrillation: Secondary | ICD-10-CM | POA: Diagnosis not present

## 2015-01-08 DIAGNOSIS — E860 Dehydration: Secondary | ICD-10-CM

## 2015-01-08 DIAGNOSIS — S32000G Wedge compression fracture of unspecified lumbar vertebra, subsequent encounter for fracture with delayed healing: Secondary | ICD-10-CM | POA: Diagnosis not present

## 2015-01-08 NOTE — Progress Notes (Addendum)
Patient ID: [name] [name], female   DOB: June 15, 1931, 81 y.o.   MRN: [id number]    Nursing Home Location:  [place]   Place of Service: SNF (31)    Allergies  Allergen Reactions  . Benicar [Olmesartan] Diarrhea and Other (See Comments)    Hypotension  . Metronidazole Nausea Only  . Morphine Hives    Chief Complaint  Patient presents with  . Acute Visit    f/u IVF, constipation, back pain    HPI:  Patient is a 81 y.o. female seen today at [facility] in the rehab section seen today to follow up regarding dehydration with low BP.  She was admitted to the rehab on 12/25/14.   She previously lived independently and had a fall on 12/23/14 and sustained an L3 fracture.   She was seen in the ER on 5/22 due to weakness and a fever of 102.  Her CXR showed a resolving pneumonia in the ER but she was placed on Levaquin 750 mg for 10 days on 5/25 due to continued fever, cough, and chills. Blood cultures were negative. She has since that time had a decrease in cough and sputum production. She has been afrebrile and on room air.  She has a hx of COPD and bronchiectasis.  She has had issues with hypotension due to the her antihypertensives and dehydration. Her meds were held and she received 2L of fluid and reports feeling much better with increased strength. Her BP has improved ranging 100-139/72-88.  She reports that her BP has been running low prior to this illness which had made her feel weak at home. She is feeling much better now.  She is requesting to be discharged. She is ambulating and dressing herself independently.  She continues to have low back pain and is using Ultram 50 mg nightly.  She also reports constipation and would like to try using more tylenol for pain and less ultram.  Review of Systems:  Review of Systems  Constitutional: Positive for activity change. Negative for fever, chills, diaphoresis, appetite change, fatigue and unexpected  weight change.  HENT: Negative for congestion, ear pain, facial swelling, nosebleeds, postnasal drip, rhinorrhea, sore throat and trouble swallowing.   Eyes: Negative for discharge and itching.  Respiratory: Positive for cough (improved). Negative for chest tightness, shortness of breath, wheezing and stridor.   Cardiovascular: Negative for chest pain, palpitations and leg swelling.  Gastrointestinal: Positive for constipation. Negative for abdominal pain, diarrhea and abdominal distention.  Genitourinary: Negative for dysuria and flank pain.  Musculoskeletal: Positive for back pain and gait problem. Negative for arthralgias.  Neurological: Positive for weakness. Negative for dizziness, seizures, syncope and speech difficulty.  Psychiatric/Behavioral: Negative for confusion and agitation.    Past Medical History  Diagnosis Date  . COPD (chronic obstructive pulmonary disease)     pft's 09/15/2005, FEV1 1.69 (76%) with ration67 and better x 14% p B2, DLC 95%  . Hypertension   . Bronchitis, chronic   . Celiac disease   . Anxiety   . Pneumonia     hx of x3  . Acute blood loss anemia 09/07/13  . Anxiety state, unspecified   . Bronchiectasis 2012  . Cerebrovascular disease, unspecified 05/26/13  . Cerebral atrophy 05/26/13   Past Surgical History  Procedure Laterality Date  . Nasal sinus surgery  years ago  . Total knee arthroplasty Bilateral [id number]  . Excision morton's neuroma Bilateral 35 years ago, 2010  . Total hip arthroplasty Right 2009  Dr. Telford Nab  . Cataract extraction Bilateral ~8 years ago  . Total hip arthroplasty Left 09/06/2013    Dr. Salli Quarry   Social History:   reports that she quit smoking about 2 years ago. Her smoking use included Cigarettes. She has a 30 pack-year smoking history. She has never used smokeless tobacco. She reports that she drinks alcohol. She reports that she does not use illicit drugs.  Family History  Problem Relation Age of Onset  . Heart  disease Father 58    heart attack  . Prostate cancer Paternal Grandfather   . Pulmonary fibrosis Brother     former smoker    Medications: Patient's Medications  New Prescriptions   No medications on file  Previous Medications   ACETAMINOPHEN (TYLENOL) 650 MG CR TABLET    Take 1,300 mg by mouth every 8 (eight) hours as needed for pain.   ALBUTEROL (PROVENTIL HFA;VENTOLIN HFA) 108 (90 BASE) MCG/ACT INHALER    Inhale 1-2 puffs into the lungs every 6 (six) hours as needed for wheezing or shortness of breath.   ALPRAZOLAM (XANAX) 0.25 MG TABLET    Take 1 tablet (0.25 mg total) by mouth every 6 (six) hours as needed for anxiety or sleep.   APIXABAN (ELIQUIS) 5 MG TABS TABLET    Take 1 tablet (5 mg total) by mouth 2 (two) times daily.   BENZONATATE (TESSALON) 200 MG CAPSULE    Take 200 mg by mouth 3 (three) times daily as needed for cough.   BIOTIN PO    Take 1,000 mcg by mouth daily.   BUDESONIDE-FORMOTEROL (SYMBICORT) 160-4.5 MCG/ACT INHALER    Inhale 2 puffs into the lungs 2 (two) times daily.     CHOLECALCIFEROL (VITAMIN D3) 1000 UNITS CAPS    Take 1,000 Units by mouth daily.    DILTIAZEM (CARDIZEM CD) 120 MG 24 HR CAPSULE    Take 120 mg by mouth every morning.    DIPHENHYDRAMINE-ACETAMINOPHEN (TYLENOL PM) 25-500 MG TABS    Take 2 tablets by mouth at bedtime.   DIPHENHYDRAMINE-PHENYLEPHRINE 25-10 MG TABS    Take 1 tablet by mouth at bedtime as needed (for sleep and decongestant).   DOCUSATE SODIUM (COLACE) 100 MG CAPSULE    Take 100 mg by mouth 2 (two) times daily.   DOFETILIDE (TIKOSYN) 250 MCG CAPSULE    Take 1 capsule (250 mcg total) by mouth 2 (two) times daily.   GLUCOSAMINE-CHONDROITIN 500-400 MG TABLET    Take 1 tablet by mouth 2 (two) times daily.     GUAIFENESIN (MUCINEX MAXIMUM STRENGTH) 1200 MG TB12    Take 1 tablet by mouth at bedtime as needed (for cough).    LOSARTAN (COZAAR) 100 MG TABLET    Take 50 mg by mouth every evening.    METOPROLOL SUCCINATE (TOPROL-XL) 50 MG 24 HR  TABLET    Take 50 mg by mouth daily. Take with or immediately following a meal.   POLYETHYL GLYCOL-PROPYL GLYCOL (SYSTANE OP)    Place 1 drop into both eyes every morning.   POLYETHYLENE GLYCOL (MIRALAX / GLYCOLAX) PACKET    Take 17 g by mouth daily.   SPIRONOLACTONE (ALDACTONE) 25 MG TABLET    Take 1 tablet (25 mg total) by mouth daily.   TRAMADOL (ULTRAM) 50 MG TABLET    Take 50 mg by mouth every 4 (four) hours as needed.   Modified Medications   No medications on file  Discontinued Medications   No medications on file  Physical Exam: Filed Vitals:   01/08/15 1209  BP: 121/84  Pulse: 65  Temp: 97.1 F (36.2 C)  Resp: 17  SpO2: 97%    Physical Exam  Constitutional: She is oriented to person, place, and time. She appears well-developed and well-nourished. No distress.  HENT:  Head: Normocephalic and atraumatic.  Mouth/Throat: Oropharynx is clear and moist. No oropharyngeal exudate.  Eyes: Conjunctivae are normal. Pupils are equal, round, and reactive to light. Right eye exhibits no discharge. Left eye exhibits no discharge.  Neck: Normal range of motion. Neck supple. No JVD present. No tracheal deviation present. No thyromegaly present.  Cardiovascular:  No murmur heard. Irregular rhythm, no edema  Pulmonary/Chest: Effort normal and breath sounds normal. No respiratory distress. She has no wheezes. She has no rales.  Abdominal: Soft. Bowel sounds are normal. She exhibits no distension. There is no tenderness.  Musculoskeletal:   Pain with ROM to back. Mild tenderness to the lumbosacral spine. No deformity  Lymphadenopathy:    She has no cervical adenopathy.  Neurological: She is alert and oriented to person, place, and time. No cranial nerve deficit.  Skin: Skin is warm and dry. She is not diaphoretic. No erythema.  Psychiatric: She has a normal mood and affect.    Labs reviewed: Basic Metabolic Panel:  Recent Labs  05/08/14 0044  05/09/14 0349 05/10/14 0307  07/18/14 2357 07/19/14 0400 12/24/14 1210 12/29/14 01/05/15  NA 138  < > 141 139 138 136* 132* 131* 134*  K 4.0  < > 4.0 4.2 3.9 3.8 4.7 4.5 4.2  CL 97  < > 101 98 98 97 94*  --   --   CO2 27  < > 28 27 24 27 27   --   --   GLUCOSE 105*  < > 107* 100* 105* 111* 122*  --   --   BUN 17  < > 16 20 13 11 19 21  24*  CREATININE 0.52  < > 0.55 0.69 0.50 0.47* 0.77 0.6 0.8  CALCIUM 9.2  < > 9.4 9.1 9.0 8.9 9.0  --   --   MG 2.1  --  2.2 2.2  --   --   --   --   --   PHOS  --   --   --   --   --  3.0  --   --   --   < > = values in this interval not displayed. Liver Function Tests:  Recent Labs  07/18/14 2357 07/19/14 0400 12/24/14 1210  AST 19 19 25   ALT 11 11 16   ALKPHOS 54 57 50  BILITOT 0.4 0.5 0.6  PROT 6.8 6.7 7.4  ALBUMIN 3.4* 3.3* 3.3*   No results for input(s): LIPASE, AMYLASE in the last 8760 hours. No results for input(s): AMMONIA in the last 8760 hours. CBC:  Recent Labs  07/18/14 2357 07/19/14 0400 12/24/14 1210  WBC 9.5 8.4 9.8  NEUTROABS 7.4 6.4 8.3*  HGB 11.0* 10.6* 10.2*  HCT 34.6* 33.7* 33.5*  MCV 98.0 98.5 95.7  PLT 210 200 264   TSH:  Recent Labs  05/08/14 0059 07/19/14 0400  TSH 2.470 2.180   A1C: No results found for: HGBA1C Lipid Panel: No results for input(s): CHOL, HDL, LDLCALC, TRIG, CHOLHDL, LDLDIRECT in the last 8760 hours.  Radiological Exams reviewed 01/08/2015  12/24/14 PCXR: IMPRESSION: 1. Resolved right upper lobe atypical infection. 2. Stable changes due to bronchiectasis in the lingula. 3. Stable changes of bronchiectasis  and additional scarring or infection in the right lower lung zone.  12/26/14 Lumbosacral xray 1)multi disc space narrowing 2) L3 compression fx noted, acute appearance  Assessment/Plan  1. Dehydration Improved.  May D/C IV saline lock  2. Bronchiectasis with acute lower respiratory infection Improved. Continue Levaquin to complete 10 days and f/u with pulmonary  3. Lumbar compression fracture, with  routine healing, subsequent encounter Mild improvement in pain. Continue ultram prn and PT.  Add tylenol 500 mg 1-2 tabs q 8 hrs prn pain.  Will need outpt follow up if no improvement in pain. Anticipate d/c on Wednesday 01/10/15  4. HTN Resume aldactone, continue Diovan at reduced dose of 50mg .   5. Afib Continue Diltiazem and metoprolol for rate control. Continue eliquis and reduce dose of 2.5 mg BID per pharmacy recomendation  6. Constipation Continue miralax and colace. Add senokot at bedtime. Constipation will improve with less use of ultram    Cindi Carbon, [place] [phone]

## 2015-01-10 ENCOUNTER — Ambulatory Visit (INDEPENDENT_AMBULATORY_CARE_PROVIDER_SITE_OTHER)
Admission: RE | Admit: 2015-01-10 | Discharge: 2015-01-10 | Disposition: A | Discharge status: Home / Self Care | Payer: Medicare Other | Source: Ambulatory Visit | Attending: Internal Medicine | Admitting: Internal Medicine

## 2015-01-10 ENCOUNTER — Ambulatory Visit (INDEPENDENT_AMBULATORY_CARE_PROVIDER_SITE_OTHER): Payer: Medicare Other | Admitting: Internal Medicine

## 2015-01-10 ENCOUNTER — Encounter: Payer: Self-pay | Admitting: Internal Medicine

## 2015-01-10 VITALS — BP 90/60 | HR 72 | Ht 66.0 in | Wt 175.8 lb

## 2015-01-10 DIAGNOSIS — J479 Bronchiectasis, uncomplicated: Secondary | ICD-10-CM

## 2015-01-10 DIAGNOSIS — I712 Thoracic aortic aneurysm, without rupture: Secondary | ICD-10-CM | POA: Diagnosis not present

## 2015-01-10 DIAGNOSIS — J449 Chronic obstructive pulmonary disease, unspecified: Secondary | ICD-10-CM

## 2015-01-10 DIAGNOSIS — I7121 Aneurysm of the ascending aorta, without rupture: Secondary | ICD-10-CM

## 2015-01-10 NOTE — Patient Instructions (Signed)
Please remember to go to the x-ray department downstairs for your tests - we will call you with the results when they are available.     Pulmonary follow up is as needed  

## 2015-01-10 NOTE — Progress Notes (Signed)
Quick Note:  ATC, NA and no option to leave a msg, WCB ______ 

## 2015-01-10 NOTE — Progress Notes (Signed)
Subjective:    Patient ID: [name] [name], female    DOB: Aug 05, 1930   MRN: [id number]  Brief patient profile:  34 yowf former church coordinator at [employer] quit smoking almost completely July 2012  With GOLD II copd by pft's 11/11/10 and residing independently at [facility]    History of Present Illness  06/25/2012 f/u ov/[name] [name]  Quit smoking 02/2011 cc indolent onset x 6 months progressively worse to point of doe x one flight of steps but no need to  stop at top, maintained on symbicort 2 bid not using albuterol at all.   rec tudorza is one puff twice daily> no better so d/c'd Only use your albuterol as a rescue medication    07/01/2013 f/u ov/[name] [name] re: copd/ lingular bronchiectasis/ ? MAI  Chief Complaint  Patient presents with  . Follow-up    Pt c/o congestion, some SOB with exertion.  s/s have improved since lv    Not using symbicort first thing Only using rescue now 2-3 x since previous ov  May need joint replacement  Has sense of chest congestion but no excess or purulent secretions  rec Plan A = automatic = Symbicort 160 Take 2 puffs first thing in am and then another 2 puffs about 12 hours later.  Plan B= Backup = Ventolin = albuterol   01/10/2015 Consultation  post admit to SNF /[place] [name] re:  Copd  GOLD II previously with ? mai on symbicort 160 2 bid only maint rx Chief Complaint  Patient presents with  . Follow-up    Pt states she was dxed with PNA 12/24/14- had fever, chills, sweats, and cough. She was txed with levaquin. She states now has very min cough with clear sputum. Her breathing is at baseline for her. She c/o feeling fatigued. She uses albuterol inhaler 5 x per wk on average.   much better since in SNF/ able to walk with walker with PT i F but c/o mild/ moderate orthostasis worse p diuresis/ Dr Wynonia Lawman notified No longer doing pool ex x one year due to wound healing L ankle Using alb once every 5 days with excellent hfa (see a/p)    No obvious daytime variabilty or  assoc sob over baseline (though very inactive now)  or cough/ cp or chest tightness, subjective wheeze overt   hb symptoms. No unusual exp hx or h/o childhood pna/ asthma or premature birth to her  knowledge.   Sleeping ok without nocturnal  or early am exacerbation  of respiratory  c/o's or need for noct saba. Also denies any obvious fluctuation of symptoms with weather or environmental changes or other aggravating or alleviating factors except as outlined above   ROS  The following are not active complaints unless bolded sore throat, dysphagia, dental problems, itching, sneezing,  nasal congestion or excess/ purulent secretions, ear ache,   fever, chills, sweats, unintended wt loss, pleuritic or exertional cp, hemoptysis,  orthopnea pnd or leg swelling, presyncope, palpitations, heartburn, abdominal pain, anorexia, nausea, vomiting, diarrhea  or change in bowel or urinary habits, change in stools or urine, dysuria,hematuria,  rash, arthralgias, visual complaints, headache, numbness weakness or ataxia or problems with walking or coordination,  change in mood/affect or memory.       Past Medical History:  COPD (ICD-496)  - PFT's 09/15/2005 FEV1 1.69 (76%) with ratio 67 and better x 14% p B2, DLC 95%  - PFT's  11/11/2010  FEV1 1.32 (63%) with ratio 62 and no better p B2,  DLC0  66% - HFA 90% 11/11/2010  COUGH, CHRONIC (ICD-786.2)  HYPERTENSION (ICD-401.9)    Past Surgical History:  Post Sinus surgery by Dr Harle Battiest in 2002  2 remote knee replacements  [facility] Neuroma repair on her left foot  Rt hip replaced 2009   Family History:  Heart dz- Father (passed with MI at age 9)  Prostate CA- PGF  PF brother former smoker   Social History:  Widowed  3 children  Retired  Solicitor   ETOH- wine and gin daily         Objective:   Physical Exam     Wt 188 September 26, 2010  > 185 11/11/2010 >188 02/26/2011 > 06/25/2012  189 > 184 08/25/2012 > 186 06/03/2013 > 07/01/2013  187 > 08/11/13  183>  01/10/2015  176   Vital signs reviewed note bp low   thin pleasant amb wf nad using rolling walker  HEENT: nl dentition, turbinates, and orophanx. Nl external ear canals without cough reflex   NECK :  without JVD/Nodes/TM/ nl carotid upstrokes bilaterally   LUNGS: chest slt barrel shaped, distant bs with mod increased Texp s wheezes/ no crackles on insp    CV:  RRR  no s3 or murmur or increase in P2, no edema   ABD:  soft and nontender with nl excursion in the supine position. No bruits or organomegaly, bowel sounds nl  MS:  warm without deformities, calf tenderness, cyanosis or clubbing  SKIN: warm and dry without lesions    NEURO:  alert, approp, no deficits         Dec 25, 2014 cxr pos for infiltrates > levaquin x 10 days >  Stopped January 04 2015   01/03/15 cxr c/w mild chf   CXR PA and Lateral:   01/10/2015 :     I personally reviewed images and agree with radiology impression as follows:     1. Clearing of patchy opacity within the right upper lobe on prior chest x-ray. No definite active process. 2. Stable scarring in the right middle lobe and lingula.     Assessment & Plan:

## 2015-01-11 ENCOUNTER — Encounter: Payer: Self-pay | Admitting: Cardiology

## 2015-01-11 ENCOUNTER — Encounter: Payer: Self-pay | Admitting: Internal Medicine

## 2015-01-11 NOTE — Assessment & Plan Note (Addendum)
Quit smoking  02/2011  - PFT's 09/15/2005 FEV1 1.69 (76%) with ratio 67 and better x 14% p B2, DLC 95%  - PFT's 11/11/2010 FEV1 1.32 (63%) with ratio 62 and no better p B2, DLC0 66%  - PFT's 08/25/2012 FEV1 1.56 (76%) with ratio 66 and no better p B2, DLCO 88% - PFTs 08/12/2013 FEV1   1.37 (64%) ratio 64 with no better B2, DLCO 59 corrects 77% p am symbicort  - Add tudorza 06/25/2012 > no better so d/c'd  - 01/10/2015 p extensive coaching HFA effectiveness =    90%    I had an extended final summary discussion with the patient reviewing all relevant studies completed to date   on the following issues:    1) the copd component of her problem is well compensated and her mdi technique is excellent so no need to change from symbicort  160 2 bid   2) see sep discussion of pulmonary infiltrates but nothing further needed for now  3) Each maintenance medication was reviewed in detail including most importantly the difference between maintenance and as needed and under what circumstances the prns are to be used.  Please see instructions for details which were reviewed in writing and the patient given a copy.     (total time counseling = 35 m )

## 2015-01-11 NOTE — Assessment & Plan Note (Signed)
See CT Chest 05/30/13  And 11/27/14 -Increasing mediastinal lymphadenopathy, favored to be reactive. There is a spectrum of findings in the lungs since suggestive of a progressive chronic indolent atypical infectious process such as mycobacterium avium intracellulare (MAI).    Although there are clearly abnormalities on CT scan, they should probably be considered "microscopic" since not obvious on plain cxr .     In the setting of obvious "macroscopic" health issues,  I am very reluctatnt to embark on an invasive w/u at this point - she would need fob / bal to document active MAI and presently there is no clincal indication to do so.   Instead I would rec use levaquin x 500 mg daily x 10 day courses prn flares and unless there are persistent symptoms or progressive "macroscopic" changes on cxr I would not pursue more aggressive rx here  Discussed in detail all the  indications, usual  risks and alternatives  relative to the benefits with patient who agrees to proceed with conservative rx as outlined.

## 2015-01-11 NOTE — Assessment & Plan Note (Signed)
Noted at 4.6 cm so need to keep bp low but now experiencing orthostasis >  Dr Wynonia Lawman managing now that she is back in independent living.

## 2015-01-11 NOTE — Progress Notes (Signed)
Patient ID: [name] [name], female   DOB: 08/14/1930, 81 y.o.   MRN: [id number]   [place], [place]    Date of visit:  01/11/2015 DOB:  [birth year]    Age:  81 yrs. Medical record number:  [id number]     Account number:  [id number] Primary Care Provider: Kelton Pillar C ____________________________ CURRENT DIAGNOSES  1. CAD Native without angina  2. Paroxysmal atrial fibrillation  3. Essential (primary) hypertension  4. Long Term (current) Use Of Anticoagulants  5. Thoracic Aortic Aneurysm, Without Rupture  6. Chronic Obstructive Pulmonary Disease, Unspecified ____________________________ ALLERGIES  Morphine, Intolerance-unknown ____________________________ MEDICATIONS  1. Tylenol Extra Strength 500 mg tablet, PRN  2. alprazolam 0.25 mg tablet, PRN  3. biotin 300 mcg tablet, 1070mcg qd  4. Symbicort 160 mcg-4.5 mcg/actuation HFA aerosol inhaler, 2 puffs bid  5. Vitamin D3 1,000 unit tablet, 1 p.o. daily  6. diphenhydramine-PSE-acetaminophen 12.5 mg-30 mg-500 mg tablet, PRN  7. Glucosamine-Chondroitin-MSM 500 mg-416.6 mg-20 mg-0.6 mg tablet, BID  8. Tikosyn 250 mcg capsule, BID  9. diltiazem ER 120 mg tablet,extended release 24 hr, 1 p.o. daily  10. losartan 100 mg tablet, 1/2 tab qPM  11. Eliquis 5 mg tablet, 1/2 tab b.i.d.  12. spironolactone 25 mg tablet, 1/2 tab daily ____________________________ CHIEF COMPLAINTS  Followup of Essential (primary) hypertension  low blood pressure ____________________________ HISTORY OF PRESENT ILLNESS Patient seen early for cardiac evaluation. She fell recently with a mechanical fall and injured her back. She evidently has been feeling poorly and her blood pressure has been low and her losartan and spironolactone has been reduced. Her primary doctor cut back her Eliquus to 2.5 mg twice a day. The most part she has been maintaining sinus just feels bad and was concerned because her primary doctor was adjusted so many of her medications. She hasn't had any  definite return of a fibrillation. She denies PND orthopnea or claudication. ____________________________ PAST HISTORY  Past Medical Illnesses:  hypertension, anxiety, COPD, hyperlipidemia, shingles, celiac disease;  Cardiovascular Illnesses:  CAD (calcified vessels), thoracic aneurysm, atrial fibrillation-paroxysmal;  Infectious Diseases:  no previous history of significant infectious diseases;  Surgical Procedures:  hip replacement-rt, sinus surgery, bil knee replacements, morton neuroma L foot, hip replacement-left February 2015;  Trauma History:  no previous history of significant trauma;  NYHA Classification:  I;  Canadian Angina Classification:  Class 0: Asymptomatic;  Cardiology Procedures-Invasive:  no previous interventional or invasive cardiology procedures;  Cardiology Procedures-Noninvasive:  lexiscan cardiolite October 2015, echocardiogram October 2015;  LVEF of 55% documented via echocardiogram on 05/08/2014,   ____________________________ CARDIO-PULMONARY TEST DATES EKG Date:  01/11/2015;  Nuclear Study Date:  05/09/2014;  Echocardiography Date: 05/08/2014;  CT Scan Date:  11/27/2014   ____________________________ FAMILY HISTORY Brother -- Brother dead, Chronic obstructive lung disease Father -- Father dead, Myocardial infarction at less than 29 Mother -- Mother dead, kidney disease ____________________________ SOCIAL HISTORY Alcohol Use:  wine and gin daily;  Smoking:  used to smoke but quit 2012, greater than 50 pack year history;  Diet:  regular diet;  Lifestyle:  widowed;  Exercise:  exercise is limited due to physical disability;  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 Respiratory: mild  dyspnea with exertion Cardiovascular:  please review HPI Genitourinary-Female: nocturia Musculoskeletal:  recent back  injury Neurological:  recent fall Psychiatric:  anxiety  ____________________________ PHYSICAL EXAMINATION VITAL SIGNS  Blood Pressure:  118/58 Sitting, Left arm, regular cuff  , 108/60 Standing, Right arm and regular cuff   Pulse:  80/min. Weight:  174.00 lbs. Height:  66"BMI: 28  Constitutional:  pleasant white female, in no acute distress, anxious Skin:  warm and dry to touch, no apparent skin lesions, or masses noted. Head:  normocephalic, normal hair pattern, no masses or tenderness Neck:  supple, without massess. No JVD, thyromegaly or carotid bruits. Carotid upstroke normal. Chest:  normal symmetry, clear to auscultation. Cardiac:  regular rhythm, normal S1 and S2, No S3 or S4, no murmurs, gallops or rubs detected. Peripheral Pulses:  the femoral,dorsalis pedis, and posterior tibial pulses are full and equal bilaterally with no bruits auscultated. Extremities & Back:  no deformities, clubbing, cyanosis, erythema or edema observed. Normal muscle strength and tone. Neurological:  no gross motor or sensory deficits noted, affect appropriate, oriented x3. ____________________________ IMPRESSIONS/PLAN 1. Labile hypertension somewhat low today- I recommended that she reduce her spironolactone to 12.5 mg daily. She should hold her losartan in the evening if the blood pressures less than 120. We certainly want to avoid hypotension. I would not change the metoprolol because of her atrial fibrillation. IF her pressure remains low consider stopping the losartan or spironolactone next. 2. Paroxysmal atrial fibrillation currently in sinus rhythm by EKG today 3. Long-term use of anticoagulation  Recommendations:  I would reduce her spironolactone to 12.5 mg daily. She may be underdosed on Eliquis and not sure why her regular doctor cut it back.I would hold losartan if blood pressure  less than 120 at night.and if she remains hypotensive might consider discontinuing it all together.  A reasonable blood pressure would be 030-092 systolic. Followup in 6 months. She will need to have her aortic aneurysm reassessed in November ____________________________ TODAYS ORDERS  1. Return Visit: 6 months  2. 12 Lead EKG: 6 months  3. 12 Lead EKG: Today                       ____________________________ Cardiology Physician:  Kerry Hough. MD [facility]

## 2015-01-11 NOTE — Progress Notes (Signed)
Quick Note:  Spoke with pt and notified of results per Dr. Wert. Pt verbalized understanding and denied any questions.  ______ 

## 2015-01-15 LAB — BASIC METABOLIC PANEL
BUN: 17 mg/dL (ref 4–21)
CREATININE: 0.8 mg/dL (ref 0.5–1.1)
GLUCOSE: 130 mg/dL
Potassium: 4.2 mmol/L (ref 3.4–5.3)
SODIUM: 129 mmol/L — AB (ref 137–147)

## 2015-01-15 LAB — CBC AND DIFFERENTIAL
HEMATOCRIT: 28 % — AB (ref 36–46)
HEMOGLOBIN: 9.4 g/dL — AB (ref 12.0–16.0)
PLATELETS: 236 10*3/uL (ref 150–399)
WBC: 7.9 10^3/mL

## 2015-01-16 ENCOUNTER — Non-Acute Institutional Stay (SKILLED_NURSING_FACILITY): Payer: Medicare Other | Admitting: Internal Medicine

## 2015-01-16 DIAGNOSIS — I712 Thoracic aortic aneurysm, without rupture: Secondary | ICD-10-CM

## 2015-01-16 DIAGNOSIS — I2510 Atherosclerotic heart disease of native coronary artery without angina pectoris: Secondary | ICD-10-CM

## 2015-01-16 DIAGNOSIS — I7121 Aneurysm of the ascending aorta, without rupture: Secondary | ICD-10-CM

## 2015-01-16 DIAGNOSIS — J479 Bronchiectasis, uncomplicated: Secondary | ICD-10-CM | POA: Diagnosis not present

## 2015-01-16 DIAGNOSIS — J449 Chronic obstructive pulmonary disease, unspecified: Secondary | ICD-10-CM

## 2015-01-16 DIAGNOSIS — I480 Paroxysmal atrial fibrillation: Secondary | ICD-10-CM | POA: Diagnosis not present

## 2015-01-16 DIAGNOSIS — I10 Essential (primary) hypertension: Secondary | ICD-10-CM

## 2015-01-16 DIAGNOSIS — Z7901 Long term (current) use of anticoagulants: Secondary | ICD-10-CM

## 2015-01-16 DIAGNOSIS — S32000A Wedge compression fracture of unspecified lumbar vertebra, initial encounter for closed fracture: Secondary | ICD-10-CM | POA: Diagnosis not present

## 2015-01-16 NOTE — Progress Notes (Signed)
Patient ID: [name] [name], female   DOB: [birth year], 81 y.o.   MRN: [id number]  Provider:  Rexene Edison. Mariea Clonts, D.O., C.M.D. Location:  [facility]  PCP: Hollace Kinnier, DO  Code Status: full code Goals of Care: Advanced Directive information Does patient have an advance directive?: Yes, Type of Advance Directive: Living will;Healthcare Power of Attorney, Does patient want to make changes to advanced directive?: No - Patient declined   Chief Complaint  Patient presents with  . Readmit To SNF    due to recurrence of chills and sweats daily, poor po intake, generalized malaise    HPI: 81 y.o. female with h/o afib, bronchiectasis with possible MAI, COPD, lumbar spinal compression fracture and AAA was seen for readmission to rehab due to recurrence of symptoms that brought her here last time.  She was not eating well at home b/c she did not want to prepare meals, felt weak and tired.  She notes that for the past few days, she's had a couple of hours of sweats then a couple of hours of chills.  She's been back on tylenol so unclear if she's had any fever.  This was stopped today so we can see if she is spiking temps.  Yesterday, she had labs and was noted to have mild hyponatremia.  Due to her recurrent chills, sweats and generalized malaise and chest xray only showing improvement of pneumonia, she was restarted on levaquin for another 10 days (not yet received due to pharmacy not sending b/c she was on tikosyn--we moved her to rehab so she can be monitored each shift with bp and hr).  I encouraged her to eat and drink well and stay physically active to prevent worsening muscle weakness.  I also spoke with her daughter, [facility], re: her condition and answered her questions.  She saw Dr. Wynonia Lawman who noted that her eliquis was reduced (per pharmacy recommendations here at [facility]).  Her losartan was reduced due to hypotension which persisted after her infection seemed to have subsided.  Mrs. Hegel  had requested that we check with cardiology and our NP sent a message through epic and faxes were sent to the office for advice on the bp med changes.  As her bp remained low, I reduced her losartan pending a response.  Note from 6/9 suggests we "reduce her spironolactone to 12.5 mg daily. Hold losartan if blood pressure less than 120 at night and if she remains hypotensive, might consider discontinuing it altogether."  ROS: Review of Systems  Constitutional: Positive for fever, chills, weight loss, malaise/fatigue and diaphoresis.  HENT: Negative for congestion.   Respiratory: Positive for cough. Negative for shortness of breath.   Cardiovascular: Negative for chest pain and leg swelling.  Gastrointestinal: Negative for abdominal pain, constipation, blood in stool and melena.  Genitourinary: Negative for dysuria, urgency and frequency.  Musculoskeletal: Negative for falls.  Skin: Negative for rash.  Neurological: Positive for weakness. Negative for dizziness.  Psychiatric/Behavioral: Negative for memory loss.    Past Medical History  Diagnosis Date  . COPD (chronic obstructive pulmonary disease)     pft's 09/15/2005, FEV1 1.69 (76%) with ration67 and better x 14% p B2, DLC 95%  . Hypertension   . Bronchitis, chronic   . Celiac disease   . Anxiety   . Pneumonia     hx of x3  . Acute blood loss anemia 09/07/13  . Anxiety state, unspecified   . Bronchiectasis 2012  . Cerebrovascular disease, unspecified 05/26/13  .  Cerebral atrophy 05/26/13   Past Surgical History  Procedure Laterality Date  . Nasal sinus surgery  years ago  . Total knee arthroplasty Bilateral [id number]  . Excision morton's neuroma Bilateral 35 years ago, 2010  . Total hip arthroplasty Right 2009    Dr. Telford Nab  . Cataract extraction Bilateral ~8 years ago  . Total hip arthroplasty Left 09/06/2013    Dr. Salli Quarry   Social History:   reports that she quit smoking about 2 years ago. Her smoking use included  Cigarettes. She has a 30 pack-year smoking history. She has never used smokeless tobacco. She reports that she drinks alcohol. She reports that she does not use illicit drugs.  Family History  Problem Relation Age of Onset  . Heart disease Father 4    heart attack  . Prostate cancer Paternal Grandfather   . Pulmonary fibrosis Brother     former smoker    Allergies  Allergen Reactions  . Benicar [Olmesartan] Diarrhea and Other (See Comments)    Hypotension  . Metronidazole Nausea Only  . Morphine Hives    Medications: Patient's Medications  New Prescriptions   No medications on file  Previous Medications   ACETAMINOPHEN (TYLENOL) 650 MG CR TABLET    Take 1,300 mg by mouth every 8 (eight) hours as needed for pain.   ALBUTEROL (PROVENTIL HFA;VENTOLIN HFA) 108 (90 BASE) MCG/ACT INHALER    Inhale 1-2 puffs into the lungs every 6 (six) hours as needed for wheezing or shortness of breath.   ALPRAZOLAM (XANAX) 0.25 MG TABLET    Take 1 tablet (0.25 mg total) by mouth every 6 (six) hours as needed for anxiety or sleep.   APIXABAN (ELIQUIS) 5 MG TABS TABLET    Take 2.5 mg by mouth 2 (two) times daily.   BIOTIN PO    Take 1,000 mcg by mouth daily.   BUDESONIDE-FORMOTEROL (SYMBICORT) 160-4.5 MCG/ACT INHALER    Inhale 2 puffs into the lungs 2 (two) times daily.     CHOLECALCIFEROL (VITAMIN D3) 1000 UNITS CAPS    Take 1,000 Units by mouth daily.    DILTIAZEM (CARDIZEM CD) 120 MG 24 HR CAPSULE    Take 120 mg by mouth every morning.    DIPHENHYDRAMINE-ACETAMINOPHEN (TYLENOL PM) 25-500 MG TABS    Take 2 tablets by mouth at bedtime.   DIPHENHYDRAMINE-PHENYLEPHRINE 25-10 MG TABS    Take 1 tablet by mouth at bedtime as needed (for sleep and decongestant).   DOCUSATE SODIUM (COLACE) 100 MG CAPSULE    Take 100 mg by mouth 2 (two) times daily.   DOFETILIDE (TIKOSYN) 250 MCG CAPSULE    Take 1 capsule (250 mcg total) by mouth 2 (two) times daily.   GLUCOSAMINE-CHONDROITIN 500-400 MG TABLET    Take 1  tablet by mouth 2 (two) times daily.     GUAIFENESIN (MUCINEX MAXIMUM STRENGTH) 1200 MG TB12    Take 1 tablet by mouth at bedtime as needed (for cough).    LOSARTAN (COZAAR) 100 MG TABLET    Take 50 mg by mouth every evening.    METOPROLOL SUCCINATE (TOPROL-XL) 50 MG 24 HR TABLET    Take 50 mg by mouth daily. Take with or immediately following a meal.   POLYETHYL GLYCOL-PROPYL GLYCOL (SYSTANE OP)    Place 1 drop into both eyes every morning.   POLYETHYLENE GLYCOL (MIRALAX / GLYCOLAX) PACKET    Take 17 g by mouth daily.   SENNA (SENOKOT) 8.6 MG TABLET    Take 1  tablet by mouth daily.   SPIRONOLACTONE (ALDACTONE) 25 MG TABLET    Take 1 tablet (25 mg total) by mouth daily.   TRAMADOL (ULTRAM) 50 MG TABLET    Take 50 mg by mouth every 4 (four) hours as needed.   Modified Medications   No medications on file  Discontinued Medications   No medications on file     Physical Exam: There were no vitals filed for this visit. There is no weight on file to calculate BMI. Physical Exam  Constitutional: She is oriented to person, place, and time. She appears well-developed and well-nourished.  HENT:  Head: Normocephalic and atraumatic.  Right Ear: External ear normal.  Left Ear: External ear normal.  Nose: Nose normal.  Mouth/Throat: Oropharynx is clear and moist. No oropharyngeal exudate.  Eyes: Conjunctivae and EOM are normal. Pupils are equal, round, and reactive to light.  Neck: Neck supple. No JVD present.  Cardiovascular: Normal rate, regular rhythm, normal heart sounds and intact distal pulses.   Pulmonary/Chest: Effort normal and breath sounds normal. She has no wheezes. She has no rales.  Abdominal: Soft. Bowel sounds are normal. She exhibits no distension and no mass. There is no tenderness.  Musculoskeletal: Normal range of motion.  Weak when tries to sit up  Neurological: She is alert and oriented to person, place, and time.  Skin: Skin is warm and dry.  Psychiatric: She has a normal  mood and affect. Her behavior is normal. Judgment and thought content normal.     Labs reviewed: Basic Metabolic Panel:  Recent Labs  05/08/14 0044  05/09/14 0349 05/10/14 0307 07/18/14 2357 07/19/14 0400 12/24/14 1210 12/29/14 01/05/15  NA 138  < > 141 139 138 136* 132* 131* 134*  K 4.0  < > 4.0 4.2 3.9 3.8 4.7 4.5 4.2  CL 97  < > 101 98 98 97 94*  --   --   CO2 27  < > 28 27 24 27 27   --   --   GLUCOSE 105*  < > 107* 100* 105* 111* 122*  --   --   BUN 17  < > 16 20 13 11 19 21  24*  CREATININE 0.52  < > 0.55 0.69 0.50 0.47* 0.77 0.6 0.8  CALCIUM 9.2  < > 9.4 9.1 9.0 8.9 9.0  --   --   MG 2.1  --  2.2 2.2  --   --   --   --   --   PHOS  --   --   --   --   --  3.0  --   --   --   < > = values in this interval not displayed. Liver Function Tests:  Recent Labs  07/18/14 2357 07/19/14 0400 12/24/14 1210  AST 19 19 25   ALT 11 11 16   ALKPHOS 54 57 50  BILITOT 0.4 0.5 0.6  PROT 6.8 6.7 7.4  ALBUMIN 3.4* 3.3* 3.3*   No results for input(s): LIPASE, AMYLASE in the last 8760 hours. No results for input(s): AMMONIA in the last 8760 hours. CBC:  Recent Labs  07/18/14 2357 07/19/14 0400 12/24/14 1210  WBC 9.5 8.4 9.8  NEUTROABS 7.4 6.4 8.3*  HGB 11.0* 10.6* 10.2*  HCT 34.6* 33.7* 33.5*  MCV 98.0 98.5 95.7  PLT 210 200 264   Cardiac Enzymes:  Recent Labs  07/19/14 0400 07/19/14 0946 07/19/14 1409  TROPONINI <0.30 <0.30 <0.30   Lab Results  Component Value Date  TSH 2.180 07/19/2014   Imaging and Procedures obtained prior to SNF admission: 01/10/15 CXR (ordered by Dr. Melvyn Novas):  1. Clearing of patchy opacity within the right upper lobe on prior chest x-ray. No definite active process. 2. Stable scarring in the right middle lobe and lingula.  Patient Care Team: Gayland Curry, DO as PCP - General (Geriatric Medicine) [facility] Paralee Cancel, MD as Consulting Physician (Orthopedic Surgery) Jacolyn Reedy, MD as Consulting Physician  (Cardiology) Royal Hawthorn, NP as Nurse Practitioner (Nurse Practitioner) Tanda Rockers, MD as Consulting Physician (Pulmonary Disease)  Assessment/Plan 1. Bronchiectasis without complication -possible atypical pneumonia -hopefully will get first dose of levaquin today now that we are observing her hr and pulse shiftly to monitor for arrhthymias from QT prolongation -continues with chills and sweats--will monitor for fever off tylenol  2. COPD GOLD II -felt to be stable on current therapy  3. Lumbar compression fracture, closed, initial encounter -cont tramadol use at least 2x daily for pain -she doesn't want anything to sedate her and we agreed that we need to sort out her lung findings first  4. Paroxysmal atrial fibrillation -has been on eliquis low dose with worsened renal function as per pharmacy recommendations -rate controlled with diltiazem and toprol  5. Essential hypertension -bp had been oscillating considerably -goal systolic [id number] per cardiology -spironolactone was on hold with bmp recheck tomorrow -dose was reduced to 12.5mg  per cardiology and I previously reduced her losartan which can be stopped if need be per her PCP  6. Coronary artery disease involving native coronary artery of native heart without angina pectoris -cont bp, lipid control  7. Ascending aortic aneurysm -being followed by Dr. Wynonia Lawman  8. Current use of long term anticoagulation -cont eliquis 2.5mg  po bid as per pharmacy recs  Functional status: independent, but very weak and was not doing well with self-care especially meal prep at home  Family/ staff Communication: discussed with her daughter, Pam, rehab nurse and nurse manager  Labs/tests ordered:  Has bmp tomorrow  Mahamud Metts L. Chaz Mcglasson, D.O. [facility] [address], [place] [address] Cell Phone (Mon-Fri 8am-5pm):  [phone] On Call:  [phone] & follow prompts after 5pm &  weekends Office Phone:  [phone] Office Fax:  [phone]

## 2015-01-17 ENCOUNTER — Encounter: Payer: Self-pay | Admitting: Internal Medicine

## 2015-01-17 LAB — BASIC METABOLIC PANEL
BUN: 13 mg/dL (ref 4–21)
CREATININE: 0.6 mg/dL (ref 0.5–1.1)
Glucose: 108 mg/dL
Potassium: 3.8 mmol/L (ref 3.4–5.3)
SODIUM: 125 mmol/L — AB (ref 137–147)

## 2015-01-18 ENCOUNTER — Other Ambulatory Visit: Payer: Self-pay | Admitting: Internal Medicine

## 2015-01-18 ENCOUNTER — Ambulatory Visit
Admission: RE | Admit: 2015-01-18 | Discharge: 2015-01-18 | Disposition: A | Discharge status: Home / Self Care | Payer: Medicare Other | Source: Ambulatory Visit | Attending: Internal Medicine | Admitting: Internal Medicine

## 2015-01-18 ENCOUNTER — Non-Acute Institutional Stay (SKILLED_NURSING_FACILITY): Payer: Medicare Other | Admitting: Adult Health

## 2015-01-18 ENCOUNTER — Encounter: Payer: Self-pay | Admitting: Adult Health

## 2015-01-18 DIAGNOSIS — J470 Bronchiectasis with acute lower respiratory infection: Secondary | ICD-10-CM

## 2015-01-18 DIAGNOSIS — E860 Dehydration: Secondary | ICD-10-CM | POA: Diagnosis not present

## 2015-01-18 DIAGNOSIS — R509 Fever, unspecified: Secondary | ICD-10-CM

## 2015-01-18 NOTE — Progress Notes (Signed)
Patient ID: [name] [name], female   DOB: June 01, 1931, 81 y.o.   MRN: [id number]   Location:  [facility]  PCP: Hollace Kinnier, DO  Code Status: full code Goals of Care: Advanced Directive information     Chief Complaint  Patient presents with  . Acute Visit    fever    HPI: 81 y.o. female with h/o afib, bronchiectasis with possible MAI, COPD, lumbar spinal compression fracture and AAA was seen today for continued fever. She has received two doses of Levaquin for possible pneumonia and had a temp of 102 at midnight and 100.3 at 8 am. She continues with malaise, sweating, and no appetite. She does not have a cough, dysuria, sputum production, or diarrhea. She denies any recent procedures, dental or otherwise, injections, wounds, etc.  She was treated for bronchitis in mid May with 10 days of Doxycycline and then 10 days of Levaquin on 12/27/14 for probable pneumonia.  See xray below. Blood cultures were negative at that time. UA C and S was negative on 6/13 and in May as well.  She has received 1L of NS for a sodium of 125, presumably from dehydration.  ROS: Review of Systems  Constitutional: Positive for fever, chills, weight loss, malaise/fatigue and diaphoresis.  HENT: Negative for congestion.   Respiratory: Negative for cough, sputum production, shortness of breath and wheezing.   Cardiovascular: Negative for chest pain and leg swelling.  Gastrointestinal: Negative for abdominal pain, diarrhea, constipation, blood in stool and melena.  Genitourinary: Negative for dysuria, urgency and frequency.  Musculoskeletal: Negative for falls.  Skin: Negative for rash.  Neurological: Positive for weakness. Negative for dizziness and focal weakness.  Psychiatric/Behavioral: Negative for memory loss.    Past Medical History  Diagnosis Date  . COPD (chronic obstructive pulmonary disease)     pft's 09/15/2005, FEV1 1.69 (76%) with ration67 and better x 14% p B2, DLC 95%  . Hypertension   .  Bronchitis, chronic   . Celiac disease   . Anxiety   . Pneumonia     hx of x3  . Acute blood loss anemia 09/07/13  . Anxiety state, unspecified   . Bronchiectasis 2012  . Cerebrovascular disease, unspecified 05/26/13  . Cerebral atrophy 05/26/13   Past Surgical History  Procedure Laterality Date  . Nasal sinus surgery  years ago  . Total knee arthroplasty Bilateral [id number]  . Excision morton's neuroma Bilateral 35 years ago, 2010  . Total hip arthroplasty Right 2009    Dr. Telford Nab  . Cataract extraction Bilateral ~8 years ago  . Total hip arthroplasty Left 09/06/2013    Dr. Salli Quarry   Social History:   reports that she quit smoking about 2 years ago. Her smoking use included Cigarettes. She has a 30 pack-year smoking history. She has never used smokeless tobacco. She reports that she drinks alcohol. She reports that she does not use illicit drugs.  Family History  Problem Relation Age of Onset  . Heart disease Father 40    heart attack  . Prostate cancer Paternal Grandfather   . Pulmonary fibrosis Brother     former smoker    Allergies  Allergen Reactions  . Benicar [Olmesartan] Diarrhea and Other (See Comments)    Hypotension  . Metronidazole Nausea Only  . Morphine Hives    Medications: Patient's Medications  New Prescriptions   No medications on file  Previous Medications   ACETAMINOPHEN (TYLENOL) 650 MG CR TABLET    Take 1,300 mg by  mouth every 8 (eight) hours as needed for pain.   ALBUTEROL (PROVENTIL HFA;VENTOLIN HFA) 108 (90 BASE) MCG/ACT INHALER    Inhale 1-2 puffs into the lungs every 6 (six) hours as needed for wheezing or shortness of breath.   ALPRAZOLAM (XANAX) 0.25 MG TABLET    Take 1 tablet (0.25 mg total) by mouth every 6 (six) hours as needed for anxiety or sleep.   APIXABAN (ELIQUIS) 5 MG TABS TABLET    Take 2.5 mg by mouth 2 (two) times daily.   BIOTIN PO    Take 1,000 mcg by mouth daily.   BUDESONIDE-FORMOTEROL (SYMBICORT) 160-4.5 MCG/ACT INHALER     Inhale 2 puffs into the lungs 2 (two) times daily.     CHOLECALCIFEROL (VITAMIN D3) 1000 UNITS CAPS    Take 1,000 Units by mouth daily.    DILTIAZEM (CARDIZEM CD) 120 MG 24 HR CAPSULE    Take 120 mg by mouth every morning.    DIPHENHYDRAMINE-ACETAMINOPHEN (TYLENOL PM) 25-500 MG TABS    Take 2 tablets by mouth at bedtime.   DIPHENHYDRAMINE-PHENYLEPHRINE 25-10 MG TABS    Take 1 tablet by mouth at bedtime as needed (for sleep and decongestant).   DOCUSATE SODIUM (COLACE) 100 MG CAPSULE    Take 100 mg by mouth 2 (two) times daily.   DOFETILIDE (TIKOSYN) 250 MCG CAPSULE    Take 1 capsule (250 mcg total) by mouth 2 (two) times daily.   GLUCOSAMINE-CHONDROITIN 500-400 MG TABLET    Take 1 tablet by mouth 2 (two) times daily.     GUAIFENESIN (MUCINEX MAXIMUM STRENGTH) 1200 MG TB12    Take 1 tablet by mouth at bedtime as needed (for cough).    LOSARTAN (COZAAR) 100 MG TABLET    Take 50 mg by mouth every evening.    METOPROLOL SUCCINATE (TOPROL-XL) 50 MG 24 HR TABLET    Take 50 mg by mouth daily. Take with or immediately following a meal.   POLYETHYL GLYCOL-PROPYL GLYCOL (SYSTANE OP)    Place 1 drop into both eyes every morning.   POLYETHYLENE GLYCOL (MIRALAX / GLYCOLAX) PACKET    Take 17 g by mouth daily.   SENNA (SENOKOT) 8.6 MG TABLET    Take 1 tablet by mouth daily.   SPIRONOLACTONE (ALDACTONE) 25 MG TABLET    Take 1 tablet (25 mg total) by mouth daily.   TRAMADOL (ULTRAM) 50 MG TABLET    Take 50 mg by mouth every 4 (four) hours as needed.   Modified Medications   No medications on file  Discontinued Medications   No medications on file     Physical Exam: Filed Vitals:   01/18/15 1103  BP: 131/79  Pulse: 92  Temp: 99.1 F (37.3 C)  Resp: 17  SpO2: 96%   There is no weight on file to calculate BMI. Physical Exam  Constitutional: She is oriented to person, place, and time. She appears well-developed and well-nourished.  HENT:  Head: Normocephalic and atraumatic.  Right Ear: External ear  normal.  Left Ear: External ear normal.  Nose: Nose normal.  Mouth/Throat: Oropharynx is clear and moist. No oropharyngeal exudate.  Eyes: Conjunctivae and EOM are normal. Pupils are equal, round, and reactive to light.  Neck: Neck supple. No JVD present.  Cardiovascular: Normal rate, regular rhythm, normal heart sounds and intact distal pulses.   Pulmonary/Chest: Effort normal and breath sounds normal. She has no wheezes. She has no rales.  coarse  Abdominal: Soft. Bowel sounds are normal. She exhibits no  distension and no mass. There is no tenderness.  Musculoskeletal: Normal range of motion.  Neurological: She is alert and oriented to person, place, and time.  Skin: Skin is warm and dry.  Psychiatric: She has a normal mood and affect. Her behavior is normal. Judgment and thought content normal.     Labs reviewed: Basic Metabolic Panel:  Recent Labs  05/08/14 0044  05/09/14 0349 05/10/14 0307 07/18/14 2357 07/19/14 0400 12/24/14 1210 12/29/14 01/05/15 01/17/15  NA 138  < > 141 139 138 136* 132* 131* 134* 125*  K 4.0  < > 4.0 4.2 3.9 3.8 4.7 4.5 4.2 3.8  CL 97  < > 101 98 98 97 94*  --   --   --   CO2 27  < > _0 --   --   --   GLUCOSE 105*  < > 107* 100* 105* 111* 122*  --   --   --   BUN 17  < > _1 24* 13  CREATININE 0.52  < > 0.55 0.69 0.50 0.47* 0.77 0.6 0.8 0.6  CALCIUM 9.2  < > 9.4 9.1 9.0 8.9 9.0  --   --   --   MG 2.1  --  2.2 2.2  --   --   --   --   --   --   PHOS  --   --   --   --   --  3.0  --   --   --   --   < > = values in this interval not displayed. Liver Function Tests:  Recent Labs  07/18/14 2357 07/19/14 0400 12/24/14 1210  AST _2 ALT _3 ALKPHOS 54 57 50  BILITOT 0.4 0.5 0.6  PROT 6.8 6.7 7.4  ALBUMIN 3.4* 3.3* 3.3*   No results for input(s): LIPASE, AMYLASE in the last 8760 hours. No results for input(s): AMMONIA in the last 8760 hours. CBC:  Recent Labs  07/18/14 2357 07/19/14 0400  12/24/14 1210  WBC 9.5 8.4 9.8  NEUTROABS 7.4 6.4 8.3*  HGB 11.0* 10.6* 10.2*  HCT 34.6* 33.7* 33.5*  MCV 98.0 98.5 95.7  PLT 210 200 264   Cardiac Enzymes:  Recent Labs  07/19/14 0400 07/19/14 0946 07/19/14 1409  TROPONINI <0.30 <0.30 <0.30   Lab Results  Component Value Date   TSH 2.180 07/19/2014   Imaging and Procedures obtained prior to SNF admission: 01/10/15 CXR (ordered by Dr. Melvyn Novas):  1. Clearing of patchy opacity within the right upper lobe on prior chest x-ray. No definite active process. 2. Stable scarring in the right middle lobe and lingula.  Patient Care Team: Gayland Curry, DO as PCP - General (Geriatric Medicine) [facility] Paralee Cancel, MD as Consulting Physician (Orthopedic Surgery) Jacolyn Reedy, MD as Consulting Physician (Cardiology) Royal Hawthorn, NP as Nurse Practitioner (Nurse Practitioner) Tanda Rockers, MD as Consulting Physician (Pulmonary Disease)  Assessment/Plan  1. Febrile illness -Unclear etiology, possibly respiratory -Send out of the facility for PA and LAT xray -Check blood cultures again if temp spikes > or = to 101 -Check CBC and ESR -F/U with Dr. Melvyn Novas when Levaquin complete -Will d/c levaquin if she continues with a fever after completing her 3rd day of therapy  2. Dehydration -Complete 1L of fluid for hyponatremia -BMP due in am -Encourage oral fluids and monitor VS qshift  Cindi Carbon,  ANP [facility] [phone]

## 2015-01-19 ENCOUNTER — Non-Acute Institutional Stay (SKILLED_NURSING_FACILITY): Payer: Medicare Other | Admitting: Adult Health

## 2015-01-19 ENCOUNTER — Encounter: Payer: Self-pay | Admitting: Adult Health

## 2015-01-19 DIAGNOSIS — I714 Abdominal aortic aneurysm, without rupture, unspecified: Secondary | ICD-10-CM

## 2015-01-19 DIAGNOSIS — J189 Pneumonia, unspecified organism: Secondary | ICD-10-CM | POA: Diagnosis not present

## 2015-01-19 DIAGNOSIS — E871 Hypo-osmolality and hyponatremia: Secondary | ICD-10-CM

## 2015-01-19 DIAGNOSIS — G4700 Insomnia, unspecified: Secondary | ICD-10-CM | POA: Diagnosis not present

## 2015-01-19 NOTE — Progress Notes (Signed)
Patient ID: [name] [name], female   DOB: 02/28/1931, 81 y.o.   MRN: [id number]   Location:  [facility]  PCP: Hollace Kinnier, DO  Code Status: full code Goals of Care: Advanced Directive information   Patient Care Team: Gayland Curry, DO as PCP - General (Geriatric Medicine) [facility] Paralee Cancel, MD as Consulting Physician (Orthopedic Surgery) Jacolyn Reedy, MD as Consulting Physician (Cardiology) Royal Hawthorn, NP as Nurse Practitioner (Nurse Practitioner) Tanda Rockers, MD as Consulting Physician (Pulmonary Disease)   Chief Complaint  Patient presents with  . Acute Visit    f/u xray    HPI: 81 y.o. female with h/o afib, bronchiectasis, COPD, lumbar spinal compression fracture and AAA was seen today in follow up of a chest xray reported on 6/16 showing a new airspace dz in the left apex worrisome for pneumonia. She was started on levaquin 6/14 and after two doses still was having a temp of 102 at midnight. She has not had any cough or sputum production. Her sats have been normal without oxygen. She has a hx of bronchiectasis.    She was treated for bronchitis in mid May with 10 days of Doxycycline and then 10 days of Levaquin on 12/27/14 for probable pneumonia.  See xrays below. Blood cultures were negative at that time. UA C and S was negative on 6/13 and in May as well.   She as received 1L of saline for a sodium of 125 and her aldactone has been on hold for 48 hrs.    She reports feeling anxious about her situation and is not sleeping well at night.   ROS: Review of Systems  Constitutional: Positive for fever, chills, weight loss, malaise/fatigue and diaphoresis.  HENT: Negative for congestion.   Respiratory: Negative for cough, sputum production, shortness of breath and wheezing.   Cardiovascular: Negative for chest pain and leg swelling.  Gastrointestinal: Negative for abdominal pain, diarrhea, constipation, blood in stool and melena.    Genitourinary: Negative for dysuria, urgency and frequency.  Musculoskeletal: Negative for falls.  Skin: Negative for rash.  Neurological: Positive for weakness. Negative for dizziness and focal weakness.  Psychiatric/Behavioral: Negative for memory loss. The patient is nervous/anxious and has insomnia.     Past Medical History  Diagnosis Date  . COPD (chronic obstructive pulmonary disease)     pft's 09/15/2005, FEV1 1.69 (76%) with ration67 and better x 14% p B2, DLC 95%  . Hypertension   . Bronchitis, chronic   . Celiac disease   . Anxiety   . Pneumonia     hx of x3  . Acute blood loss anemia 09/07/13  . Anxiety state, unspecified   . Bronchiectasis 2012  . Cerebrovascular disease, unspecified 05/26/13  . Cerebral atrophy 05/26/13   Past Surgical History  Procedure Laterality Date  . Nasal sinus surgery  years ago  . Total knee arthroplasty Bilateral [id number]  . Excision morton's neuroma Bilateral 35 years ago, 2010  . Total hip arthroplasty Right 2009    Dr. Telford Nab  . Cataract extraction Bilateral ~8 years ago  . Total hip arthroplasty Left 09/06/2013    Dr. Salli Quarry   Social History:   reports that she quit smoking about 2 years ago. Her smoking use included Cigarettes. She has a 30 pack-year smoking history. She has never used smokeless tobacco. She reports that she drinks alcohol. She reports that she does not use illicit drugs.  Family History  Problem Relation Age of  Onset  . Heart disease Father 77    heart attack  . Prostate cancer Paternal Grandfather   . Pulmonary fibrosis Brother     former smoker    Allergies  Allergen Reactions  . Benicar [Olmesartan] Diarrhea and Other (See Comments)    Hypotension  . Metronidazole Nausea Only  . Morphine Hives    Medications: Patient's Medications  New Prescriptions   No medications on file  Previous Medications   ACETAMINOPHEN (TYLENOL) 650 MG CR TABLET    Take 1,300 mg by mouth every 8 (eight) hours as  needed for pain.   ALBUTEROL (PROVENTIL HFA;VENTOLIN HFA) 108 (90 BASE) MCG/ACT INHALER    Inhale 1-2 puffs into the lungs every 6 (six) hours as needed for wheezing or shortness of breath.   ALPRAZOLAM (XANAX) 0.25 MG TABLET    Take 1 tablet (0.25 mg total) by mouth every 6 (six) hours as needed for anxiety or sleep.   APIXABAN (ELIQUIS) 5 MG TABS TABLET    Take 2.5 mg by mouth 2 (two) times daily.   BIOTIN PO    Take 1,000 mcg by mouth daily.   BUDESONIDE-FORMOTEROL (SYMBICORT) 160-4.5 MCG/ACT INHALER    Inhale 2 puffs into the lungs 2 (two) times daily.     CHOLECALCIFEROL (VITAMIN D3) 1000 UNITS CAPS    Take 1,000 Units by mouth daily.    CIPROFLOXACIN (CIPRO) 500 MG TABLET    Take 500 mg by mouth 2 (two) times daily.   DILTIAZEM (CARDIZEM CD) 120 MG 24 HR CAPSULE    Take 120 mg by mouth every morning.    DIPHENHYDRAMINE-ACETAMINOPHEN (TYLENOL PM) 25-500 MG TABS    Take 2 tablets by mouth at bedtime.   DIPHENHYDRAMINE-PHENYLEPHRINE 25-10 MG TABS    Take 1 tablet by mouth at bedtime as needed (for sleep and decongestant).   DOCUSATE SODIUM (COLACE) 100 MG CAPSULE    Take 100 mg by mouth 2 (two) times daily.   DOFETILIDE (TIKOSYN) 250 MCG CAPSULE    Take 1 capsule (250 mcg total) by mouth 2 (two) times daily.   GLUCOSAMINE-CHONDROITIN 500-400 MG TABLET    Take 1 tablet by mouth 2 (two) times daily.     GUAIFENESIN (MUCINEX MAXIMUM STRENGTH) 1200 MG TB12    Take 1 tablet by mouth at bedtime as needed (for cough).    LOSARTAN (COZAAR) 100 MG TABLET    Take 50 mg by mouth every evening.    METOPROLOL SUCCINATE (TOPROL-XL) 50 MG 24 HR TABLET    Take 50 mg by mouth daily. Take with or immediately following a meal.   POLYETHYL GLYCOL-PROPYL GLYCOL (SYSTANE OP)    Place 1 drop into both eyes every morning.   POLYETHYLENE GLYCOL (MIRALAX / GLYCOLAX) PACKET    Take 17 g by mouth daily.   SENNA (SENOKOT) 8.6 MG TABLET    Take 1 tablet by mouth daily.   SPIRONOLACTONE (ALDACTONE) 25 MG TABLET    Take 1  tablet (25 mg total) by mouth daily.   TRAMADOL (ULTRAM) 50 MG TABLET    Take 50 mg by mouth every 4 (four) hours as needed.   Modified Medications   No medications on file  Discontinued Medications   No medications on file     Physical Exam: Filed Vitals:   01/19/15 1214  BP: 134/82  Pulse: 74  Temp: 97.1 F (36.2 C)  Resp: 19  SpO2: 96%   There is no weight on file to calculate BMI. Physical Exam  Constitutional: She is oriented to person, place, and time. She appears well-developed and well-nourished.  HENT:  Head: Normocephalic and atraumatic.  Right Ear: External ear normal.  Left Ear: External ear normal.  Nose: Nose normal.  Mouth/Throat: Oropharynx is clear and moist. No oropharyngeal exudate.  Eyes: Conjunctivae and EOM are normal. Pupils are equal, round, and reactive to light.  Neck: Neck supple. No JVD present.  Cardiovascular: Normal rate, regular rhythm, normal heart sounds and intact distal pulses.   Pulmonary/Chest: Effort normal and breath sounds normal. She has no wheezes. She has no rales.  coarse  Abdominal: Soft. Bowel sounds are normal. She exhibits no distension and no mass. There is no tenderness.  Musculoskeletal: Normal range of motion.  Neurological: She is alert and oriented to person, place, and time.  Skin: Skin is warm and dry.  Psychiatric: She has a normal mood and affect. Her behavior is normal. Judgment and thought content normal.     Labs reviewed: Basic Metabolic Panel:  Recent Labs  05/08/14 0044  05/09/14 0349 05/10/14 0307 07/18/14 2357 07/19/14 0400 12/24/14 1210 12/29/14 01/05/15 01/17/15  NA 138  < > 141 139 138 136* 132* 131* 134* 125*  K 4.0  < > 4.0 4.2 3.9 3.8 4.7 4.5 4.2 3.8  CL 97  < > 101 98 98 97 94*  --   --   --   CO2 27  < > 28 27 24 27 27   --   --   --   GLUCOSE 105*  < > 107* 100* 105* 111* 122*  --   --   --   BUN 17  < > 16 20 13 11 19 21  24* 13  CREATININE 0.52  < > 0.55 0.69 0.50 0.47* 0.77 0.6 0.8  0.6  CALCIUM 9.2  < > 9.4 9.1 9.0 8.9 9.0  --   --   --   MG 2.1  --  2.2 2.2  --   --   --   --   --   --   PHOS  --   --   --   --   --  3.0  --   --   --   --   < > = values in this interval not displayed. Liver Function Tests:  Recent Labs  07/18/14 2357 07/19/14 0400 12/24/14 1210  AST 19 19 25   ALT 11 11 16   ALKPHOS 54 57 50  BILITOT 0.4 0.5 0.6  PROT 6.8 6.7 7.4  ALBUMIN 3.4* 3.3* 3.3*   No results for input(s): LIPASE, AMYLASE in the last 8760 hours. No results for input(s): AMMONIA in the last 8760 hours. CBC:  Recent Labs  07/18/14 2357 07/19/14 0400 12/24/14 1210  WBC 9.5 8.4 9.8  NEUTROABS 7.4 6.4 8.3*  HGB 11.0* 10.6* 10.2*  HCT 34.6* 33.7* 33.5*  MCV 98.0 98.5 95.7  PLT 210 200 264   Cardiac Enzymes:  Recent Labs  07/19/14 0400 07/19/14 0946 07/19/14 1409  TROPONINI <0.30 <0.30 <0.30   Lab Results  Component Value Date   TSH 2.180 07/19/2014   Imaging and Procedures obtained prior to SNF admission: 01/10/15 CXR (ordered by Dr. Melvyn Novas):   1. Clearing of patchy opacity within the right upper lobe on prior chest x-ray. No definite active process. 2. Stable scarring in the right middle lobe and lingula.  6/16/16CXR  IMPRESSION: New airspace opacities of developed at the left apex worrisome for pneumonia. Followup PA and lateral chest  X-ray is recommended in 3-4 weeks following trial of antibiotic therapy to ensure resolution and exclude underlying malignancy.   Assessment/Plan  1. Pneumonia -In consultation with her pulmonologist, we have decided to change her therapy to Cipro 500mg  BID for 10 days for pseudomonas coverage.  -Add Florastor 1 cap BID for 10 days -Check EKG today and in 48 hrs to monitor for QT prolongation -F/U with Pulmonary on 6/24, may consider bronchoscopy   2. Insomnia May use xanax 0.5 mg qhs prn for sleep  3. AAA Her goal BP per cardiology should be <150/90.  Her aldactone is on hold due to low BP and dehydration.  We will monitor her pressure closely.  4. Hyponatremia Continue to hold aldactone and encourage fluids. No IVF at this time. Recheck BMP on Monday  We discussed her plan of care and I also called her step daughter, Ms. Beswick, to go over the details of her care.  Cindi Carbon, ANP [name] [phone]

## 2015-01-24 ENCOUNTER — Encounter: Payer: Self-pay | Admitting: Internal Medicine

## 2015-01-25 ENCOUNTER — Other Ambulatory Visit: Payer: Self-pay

## 2015-01-25 LAB — CBC AND DIFFERENTIAL
HEMATOCRIT: 29 % — AB (ref 36–46)
Hemoglobin: 9.2 g/dL — AB (ref 12.0–16.0)
PLATELETS: 482 10*3/uL — AB (ref 150–399)
WBC: 6.4 10^3/mL

## 2015-01-25 LAB — BASIC METABOLIC PANEL
BUN: 19 mg/dL (ref 4–21)
CREATININE: 0.8 mg/dL (ref 0.5–1.1)
Glucose: 87 mg/dL
POTASSIUM: 5.1 mmol/L (ref 3.4–5.3)
Sodium: 138 mmol/L (ref 137–147)

## 2015-01-26 ENCOUNTER — Encounter: Payer: Self-pay | Admitting: Internal Medicine

## 2015-01-26 ENCOUNTER — Ambulatory Visit (INDEPENDENT_AMBULATORY_CARE_PROVIDER_SITE_OTHER)
Admission: RE | Admit: 2015-01-26 | Discharge: 2015-01-26 | Disposition: A | Discharge status: Home / Self Care | Payer: Medicare Other | Source: Ambulatory Visit | Attending: Internal Medicine | Admitting: Internal Medicine

## 2015-01-26 ENCOUNTER — Ambulatory Visit (INDEPENDENT_AMBULATORY_CARE_PROVIDER_SITE_OTHER): Payer: Medicare Other | Admitting: Internal Medicine

## 2015-01-26 ENCOUNTER — Encounter: Payer: Self-pay | Admitting: Adult Health

## 2015-01-26 ENCOUNTER — Non-Acute Institutional Stay (SKILLED_NURSING_FACILITY): Payer: Medicare Other | Admitting: Adult Health

## 2015-01-26 VITALS — BP 102/64 | HR 68 | Ht 66.0 in | Wt 175.0 lb

## 2015-01-26 DIAGNOSIS — J479 Bronchiectasis, uncomplicated: Secondary | ICD-10-CM

## 2015-01-26 DIAGNOSIS — N3281 Overactive bladder: Secondary | ICD-10-CM

## 2015-01-26 DIAGNOSIS — J189 Pneumonia, unspecified organism: Secondary | ICD-10-CM

## 2015-01-26 DIAGNOSIS — J449 Chronic obstructive pulmonary disease, unspecified: Secondary | ICD-10-CM

## 2015-01-26 DIAGNOSIS — I712 Thoracic aortic aneurysm, without rupture: Secondary | ICD-10-CM | POA: Diagnosis not present

## 2015-01-26 DIAGNOSIS — S32000A Wedge compression fracture of unspecified lumbar vertebra, initial encounter for closed fracture: Secondary | ICD-10-CM

## 2015-01-26 DIAGNOSIS — I10 Essential (primary) hypertension: Secondary | ICD-10-CM

## 2015-01-26 DIAGNOSIS — D6489 Other specified anemias: Secondary | ICD-10-CM | POA: Diagnosis not present

## 2015-01-26 DIAGNOSIS — K5901 Slow transit constipation: Secondary | ICD-10-CM

## 2015-01-26 DIAGNOSIS — I480 Paroxysmal atrial fibrillation: Secondary | ICD-10-CM | POA: Diagnosis not present

## 2015-01-26 DIAGNOSIS — I7121 Aneurysm of the ascending aorta, without rupture: Secondary | ICD-10-CM

## 2015-01-26 DIAGNOSIS — E871 Hypo-osmolality and hyponatremia: Secondary | ICD-10-CM | POA: Diagnosis not present

## 2015-01-26 DIAGNOSIS — K5900 Constipation, unspecified: Secondary | ICD-10-CM | POA: Insufficient documentation

## 2015-01-26 NOTE — Progress Notes (Signed)
Subjective:    Patient ID: [name] [name], female    DOB: 08/15/1930   MRN: [id number]  Brief patient profile:  81 yowf former church coordinator at [employer] quit smoking almost completely July 2012  With GOLD II copd by pft's 11/11/10 and residing independently at [facility]    History of Present Illness  79/22/2013 f/u ov/[name] [name]  Quit smoking 02/2011 cc indolent onset x 6 months progressively worse to point of doe x one flight of steps but no need to  stop at top, maintained on symbicort 2 bid not using albuterol at all.   rec tudorza is one puff twice daily> no better so d/c'd Only use your albuterol as a rescue medication    79/28/2014 f/u ov/[name] [name] re: copd/ lingular bronchiectasis/ ? MAI  Chief Complaint  Patient presents with  . Follow-up    Pt c/o congestion, some SOB with exertion.  s/s have improved since lv    Not using symbicort first thing Only using rescue now 2-3 x since previous ov  May need joint replacement  Has sense of chest congestion but no excess or purulent secretions  rec Plan A = automatic = Symbicort 160 Take 2 puffs first thing in am and then another 2 puffs about 12 hours later.  Plan B= Backup = Ventolin = albuterol   79/03/2015 Consultation  post admit to SNF /[name] [name] re:  Copd  GOLD II previously with ? mai on symbicort 160 2 bid only maint rx Chief Complaint  Patient presents with  . Follow-up    Pt states she was dxed with PNA 12/24/14- had fever, chills, sweats, and cough. She was txed with levaquin. She states now has very min cough with clear sputum. Her breathing is at baseline for her. She c/o feeling fatigued. She uses albuterol inhaler 5 x per wk on average.   much better since in SNF/ able to walk with walker with PT i F but c/o mild/ moderate orthostasis worse p diuresis/ Dr Wynonia Lawman notified No longer doing pool ex x one year due to wound healing L ankle Using alb once every 5 days with excellent hfa (see a/p)  rec No change rx     79/24/2016  f/u ov/[name] [name] re: bronchiectasis/ recent RUL pna/ on cipro x 10 day course  Chief Complaint  Patient presents with  . Follow-up    Breathing is unchanged. She is coughing very little- with clear sputum. She still feels fatigued and relates this to her iron def.   Not limited by breathing from desired activities  But not very active at this point  No obvious daytime variabilty or assoc excess or purulent or bloody sputum or cp or chest tightness, subjective wheeze overt   hb symptoms. No unusual exp hx or h/o childhood pna/ asthma or premature birth to her  knowledge.   Sleeping ok without nocturnal  or early am exacerbation  of respiratory  c/o's or need for noct saba. Also denies any obvious fluctuation of symptoms with weather or environmental changes or other aggravating or alleviating factors except as outlined above   ROS  The following are not active complaints unless bolded sore throat, dysphagia, dental problems, itching, sneezing,  nasal congestion or excess/ purulent secretions, ear ache,   fever, chills, sweats, unintended wt loss, pleuritic or exertional cp, hemoptysis,  orthopnea pnd or leg swelling, presyncope, palpitations, heartburn, abdominal pain, anorexia, nausea, vomiting, diarrhea  or change in bowel or urinary habits, change in stools or urine, dysuria,hematuria,  rash, arthralgias, visual complaints, headache, numbness weakness or ataxia or problems with walking or coordination,  change in mood/affect or memory.       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 Nl external ear canals without cough reflex   NECK :  without JVD/Nodes/TM/ nl carotid upstrokes bilaterally   LUNGS: chest slt barrel shaped, distant bs with mod increased Texp s wheezes/ no crackles on insp    CV:  RRR  no s3 or murmur or increase in P2, no edema   ABD:  soft and nontender with nl excursion in the supine position. No bruits or organomegaly, bowel sounds nl  MS:  warm without deformities, calf tenderness, cyanosis or clubbing  SKIN: warm and dry without lesions    NEURO:  alert, approp, no deficits         Dec 25, 2014 cxr pos for infiltrates > levaquin x 10 days >  Stopped January 04 2015    I personally reviewed images and agree with radiology impression as follows:  CXR:  01/26/15 No active cardiopulmonary disease. Persistent patchy density in the left apex and lower lungs as described on previous CT scan likely waxing and waning chronic atypical infectious or inflammatory process.                  Assessment & Plan:

## 2015-01-26 NOTE — Progress Notes (Signed)
Patient ID: [name] [name], female   DOB: 1931-02-13, 81 y.o.   MRN: [id number]   Location:  [facility]  PCP: Hollace Kinnier, DO  Code Status: full code Goals of Care: Advanced Directive information   Patient Care Team: Gayland Curry, DO as PCP - General (Geriatric Medicine) [facility] Paralee Cancel, MD as Consulting Physician (Orthopedic Surgery) Jacolyn Reedy, MD as Consulting Physician (Cardiology) Royal Hawthorn, NP as Nurse Practitioner (Nurse Practitioner) Tanda Rockers, MD as Consulting Physician (Pulmonary Disease)   Chief Complaint  Patient presents with  . Discharge Note    HPI: 81 y.o. female with h/o afib, bronchiectasis, COPD, lumbar spinal compression fracture and AAA was seen today for discharge from the rehab unit. This is her second admission due to a HCAP. She has been afebrile without a cough or fever. She was started on Levaquin on 6/13 and then changed to Cipro on 6/15 for pseudomonas coverage. She is to complete 10 days, as of 6/25.  She has improved and is ready for discharge.    Her BP has ran low and aldactone has been on hold.   She suffered a lumbar fracture of L3 due to a mechanical fall, noted on xray on 5/23.  She is currently using ultram for pain relief. She is ambulatory with a walker and has no radiculopathy. She continues to report mild to moderate pain, especially when standing or walking for prolonged periods.   She is reporting frequent trips to the bathroom at night and incontinence. She is using xanax at bedtime to help her sleep but got up q2 hrs last night to urinate. She denies dysuria or bladder pain.   Her H/H on 01/20/15 was 8.6/27 normocytic, normochromic. Recheck on 6/23 shows an H/H of 9.2/28.8. She has been hemoccult negative x2. She remains on eliquis for afib. She is on 2.5 mg BID due to a pharmacy recommendation due to an interaction with cardizem, her age, and renal function.   She is now eating and  drinking well with a normal BMP. On admission she required IVF due to hyponatremia and low BP. This has resolved.   ROS: Review of Systems  Constitutional: Negative for fever, chills, malaise/fatigue and diaphoresis.  HENT: Negative for congestion.   Respiratory: Negative for cough, sputum production, shortness of breath and wheezing.   Cardiovascular: Negative for chest pain and leg swelling.  Gastrointestinal: Negative for abdominal pain, diarrhea, constipation, blood in stool and melena.  Genitourinary: Positive for frequency. Negative for dysuria and urgency.       Leakage at night  Musculoskeletal: Positive for back pain. Negative for falls.  Skin: Negative for rash.  Neurological: Negative for dizziness, focal weakness and weakness.  Psychiatric/Behavioral: Negative for memory loss. The patient is nervous/anxious and has insomnia.     Past Medical History  Diagnosis Date  . COPD (chronic obstructive pulmonary disease)     pft's 09/15/2005, FEV1 1.69 (76%) with ration67 and better x 14% p B2, DLC 95%  . Hypertension   . Bronchitis, chronic   . Celiac disease   . Anxiety   . Pneumonia     hx of x3  . Acute blood loss anemia 09/07/13  . Anxiety state, unspecified   . Bronchiectasis 2012  . Cerebrovascular disease, unspecified 05/26/13  . Cerebral atrophy 05/26/13   Past Surgical History  Procedure Laterality Date  . Nasal sinus surgery  years ago  . Total knee arthroplasty Bilateral [id number]  . Excision  morton's neuroma Bilateral 35 years ago, 2010  . Total hip arthroplasty Right 2009    Dr. Telford Nab  . Cataract extraction Bilateral ~8 years ago  . Total hip arthroplasty Left 09/06/2013    Dr. Salli Quarry   Social History:   reports that she quit smoking about 3 years ago. Her smoking use included Cigarettes. She has a 30 pack-year smoking history. She has never used smokeless tobacco. She reports that she drinks alcohol. She reports that she does not use illicit  drugs.  Family History  Problem Relation Age of Onset  . Heart disease Father 57    heart attack  . Prostate cancer Paternal Grandfather   . Pulmonary fibrosis Brother     former smoker    Allergies  Allergen Reactions  . Benicar [Olmesartan] Diarrhea and Other (See Comments)    Hypotension  . Metronidazole Nausea Only  . Morphine Hives    Medications: Patient's Medications  New Prescriptions   No medications on file  Previous Medications   ACETAMINOPHEN (TYLENOL) 650 MG CR TABLET    Take 1,300 mg by mouth every 8 (eight) hours as needed for pain.   ALBUTEROL (PROVENTIL HFA;VENTOLIN HFA) 108 (90 BASE) MCG/ACT INHALER    Inhale 1-2 puffs into the lungs every 6 (six) hours as needed for wheezing or shortness of breath.   ALPRAZOLAM (XANAX) 0.25 MG TABLET    Take 1 tablet (0.25 mg total) by mouth every 6 (six) hours as needed for anxiety or sleep.   APIXABAN (ELIQUIS) 5 MG TABS TABLET    Take 2.5 mg by mouth 2 (two) times daily.   BIOTIN PO    Take 1,000 mcg by mouth daily.   BUDESONIDE-FORMOTEROL (SYMBICORT) 160-4.5 MCG/ACT INHALER    Inhale 2 puffs into the lungs 2 (two) times daily.     CHOLECALCIFEROL (VITAMIN D3) 1000 UNITS CAPS    Take 1,000 Units by mouth daily.    CIPROFLOXACIN (CIPRO) 500 MG TABLET    Take 500 mg by mouth 2 (two) times daily.   DILTIAZEM (CARDIZEM CD) 120 MG 24 HR CAPSULE    Take 120 mg by mouth every morning.    DIPHENHYDRAMINE-ACETAMINOPHEN (TYLENOL PM) 25-500 MG TABS    Take 2 tablets by mouth at bedtime.   DIPHENHYDRAMINE-PHENYLEPHRINE 25-10 MG TABS    Take 1 tablet by mouth at bedtime as needed (for sleep and decongestant).   DOCUSATE SODIUM (COLACE) 100 MG CAPSULE    Take 100 mg by mouth 2 (two) times daily.   DOFETILIDE (TIKOSYN) 250 MCG CAPSULE    Take 1 capsule (250 mcg total) by mouth 2 (two) times daily.   GLUCOSAMINE-CHONDROITIN 500-400 MG TABLET    Take 1 tablet by mouth 2 (two) times daily.     GUAIFENESIN (MUCINEX MAXIMUM STRENGTH) 1200 MG  TB12    Take 1 tablet by mouth at bedtime as needed (for cough).    LOSARTAN (COZAAR) 100 MG TABLET    Take 50 mg by mouth every evening.    METOPROLOL SUCCINATE (TOPROL-XL) 50 MG 24 HR TABLET    Take 50 mg by mouth daily. Take with or immediately following a meal.   POLYETHYL GLYCOL-PROPYL GLYCOL (SYSTANE OP)    Place 1 drop into both eyes every morning.   POLYETHYLENE GLYCOL (MIRALAX / GLYCOLAX) PACKET    Take 17 g by mouth daily.   SENNA (SENOKOT) 8.6 MG TABLET    Take 1 tablet by mouth daily.   TRAMADOL (ULTRAM) 50 MG TABLET  Take 50 mg by mouth every 4 (four) hours as needed.   Modified Medications   No medications on file  Discontinued Medications   SPIRONOLACTONE (ALDACTONE) 25 MG TABLET    Take 1 tablet (25 mg total) by mouth daily.     Physical Exam: Filed Vitals:   01/26/15 0952  BP: 103/70  Pulse: 98  Temp: 98.7 F (37.1 C)  Resp: 20  Weight: 184 lb (83.462 kg)  SpO2: 96%   Body mass index is 29.71 kg/(m^2). Physical Exam  Constitutional: She is oriented to person, place, and time. She appears well-developed and well-nourished.  HENT:  Head: Normocephalic and atraumatic.  Right Ear: External ear normal.  Left Ear: External ear normal.  Nose: Nose normal.  Mouth/Throat: Oropharynx is clear and moist. No oropharyngeal exudate.  Eyes: Conjunctivae and EOM are normal. Pupils are equal, round, and reactive to light.  Neck: Neck supple. No JVD present.  Cardiovascular: Normal rate, regular rhythm, normal heart sounds and intact distal pulses.   Pulmonary/Chest: Effort normal and breath sounds normal. She has no wheezes. She has no rales.  Abdominal: Soft. Bowel sounds are normal. She exhibits no distension and no mass. There is no tenderness.  Musculoskeletal: Normal range of motion.  Neurological: She is alert and oriented to person, place, and time.  Skin: Skin is warm and dry.  Psychiatric: She has a normal mood and affect. Her behavior is normal. Judgment and  thought content normal.     Labs reviewed: Basic Metabolic Panel:  Recent Labs  05/08/14 0044  05/09/14 0349 05/10/14 0307 07/18/14 2357 07/19/14 0400 12/24/14 1210  01/15/15 01/17/15 01/25/15  NA 138  < > 141 139 138 136* 132*  < > 129* 125* 138  K 4.0  < > 4.0 4.2 3.9 3.8 4.7  < > 4.2 3.8 5.1  CL 97  < > 101 98 98 97 94*  --   --   --   --   CO2 27  < > 28 27 24 27 27   --   --   --   --   GLUCOSE 105*  < > 107* 100* 105* 111* 122*  --   --   --   --   BUN 17  < > 16 20 13 11 19   < > 17 13 19   CREATININE 0.52  < > 0.55 0.69 0.50 0.47* 0.77  < > 0.8 0.6 0.8  CALCIUM 9.2  < > 9.4 9.1 9.0 8.9 9.0  --   --   --   --   MG 2.1  --  2.2 2.2  --   --   --   --   --   --   --   PHOS  --   --   --   --   --  3.0  --   --   --   --   --   < > = values in this interval not displayed. Liver Function Tests:  Recent Labs  07/18/14 2357 07/19/14 0400 12/24/14 1210  AST 19 19 25   ALT 11 11 16   ALKPHOS 54 57 50  BILITOT 0.4 0.5 0.6  PROT 6.8 6.7 7.4  ALBUMIN 3.4* 3.3* 3.3*   No results for input(s): LIPASE, AMYLASE in the last 8760 hours. No results for input(s): AMMONIA in the last 8760 hours. CBC:  Recent Labs  07/18/14 2357 07/19/14 0400 12/24/14 1210 01/15/15 01/25/15  WBC 9.5 8.4 9.8 7.9 6.4  NEUTROABS 7.4 6.4 8.3*  --   --   HGB 11.0* 10.6* 10.2* 9.4* 9.2*  HCT 34.6* 33.7* 33.5* 28* 29*  MCV 98.0 98.5 95.7  --   --   PLT 210 200 264 236 482*   Cardiac Enzymes:  Recent Labs  07/19/14 0400 07/19/14 0946 07/19/14 1409  TROPONINI <0.30 <0.30 <0.30   Lab Results  Component Value Date   TSH 2.180 07/19/2014   Imaging and Procedures obtained prior to SNF admission: 01/10/15 CXR (ordered by Dr. Melvyn Novas):   1. Clearing of patchy opacity within the right upper lobe on prior chest x-ray. No definite active process. 2. Stable scarring in the right middle lobe and lingula.  6/16/16CXR  IMPRESSION: New airspace opacities of developed at the left apex worrisome  for pneumonia. Followup PA and lateral chest X-ray is recommended in 3-4 weeks following trial of antibiotic therapy to ensure resolution and exclude underlying malignancy.   Assessment/Plan  1.  HC associated Pneumonia -Complete Cipro and Florastor and f/u with pulmonary today   2. Insomnia May use xanax 0.5 mg qhs prn for sleep, hold tonight and see if it help her frequent trips to the bathroom  3. AAA Her goal BP per cardiology should be <150/90.  Current she is not on aldactone but will need f/u with Dr. Mariea Clonts regarding her BP  4. Hyponatremia Resolved  5. Lumbar compression fracture Continue prn ultram and f/u with Dr. Mariea Clonts for continued pain  6. Afib Continue Eliquis for CVA risk reduction, as well as cardizem, tikosyn, cozaar,  and toprol for rate control. Followed by Dr. Wynonia Lawman  7. OAB Try diet changed and bladder training If no improvement consider Myrbetriq  8. Constipation Continue Miralax and senokot  9. HTN Soft BP today, see above plan  10. Anemia Improving, most likely due to acute illness  She is ready for discharge and will need to f/u with Dr. Mariea Clonts in 10-14 days regarding her back pain, OAB, and BP monitoring.  I spent >30 min on this discharge  Cindi Carbon, [place] [phone]

## 2015-01-26 NOTE — Patient Instructions (Addendum)
Bronchiectasis =   you have scarring of your bronchial tubes which means that they don't function perfectly normally and mucus tends to pool in certain areas of your lung which can cause pneumonia and further scarring of your lung and bronchial tubes  Whenever you develop cough congestion take mucinex  Up to 1200 mg every 12 hours > these will help keep the mucus loose and flowing but if your condition worsens you need to seek help immediately preferably here or somewhere inside the [facility] system to compare xrays ( worse = darker or bloody mucus or pain on breathing in)    Please remember to go to the  x-ray department downstairs for your tests - we will call you with the results when they are available.  We need to make sure you received prevnar -13

## 2015-01-28 ENCOUNTER — Encounter: Payer: Self-pay | Admitting: Internal Medicine

## 2015-01-28 NOTE — Assessment & Plan Note (Signed)
Quit smoking  02/2011  - PFT's 09/15/2005 FEV1 1.69 (76%) with ratio 67 and better x 14% p B2, DLC 95%  - PFT's 11/11/2010 FEV1 1.32 (63%) with ratio 62 and no better p B2, DLC0 66%  - PFT's 08/25/2012 FEV1 1.56 (76%) with ratio 66 and no better p B2, DLCO 88% - PFTs 08/12/2013 FEV1   1.37 (64%) ratio 64 with no better B2, DLCO 59 corrects 77% p am symbicort  - Add tudorza 06/25/2012 > no better so d/cd - 01/10/2015 p extensive coaching HFA effectiveness =    90%  Doing well on symbicort 160 2 bid and prn saba

## 2015-01-28 NOTE — Assessment & Plan Note (Addendum)
See CT Chest 05/30/13  And 11/27/14 -Increasing mediastinal lymphadenopathy, favored to be reactive. 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   I had an extended summary discussion with the patient and daughter reviewing all relevant studies completed to date and  lasting 15 to 20 minutes of a 25 minute visit on the following ongoing concerns:  1) pathyophysiology of bronchiectasis/ MAI reviewed using the "punishment should fit the crime" analogy  2) she gets very rapidly better with infiltrates gone in days on abx so that's more typical of a bacterial process like an opportunistic GNR than MAI at this point, though she is at risk of both  3) we need to be sure she's received prevnar 13 and if not provide it for [facility] at [facility]  4) I told her I thought she was getting outstanding care at [facility] and should depend on Dr Mariea Clonts and her team  for further guidance as far as micromanaging her problems and rely less on specialists since we are not seeing her "real time"  Whereas Dr Mariea Clonts is and also we are not able to look at the "big picture" as well since we are the specialists and Dr Mariea Clonts can decide which specialists are appropriate at this point and better coordinate her care.   5) only remaining pulmonary  issue is whether fever/ infiltrates will recur - if it happens early in the week we can arrange fob the next day and hold abx until seen, if not I would do the cipro 500 bid x 10 days again  6) pulmonary f/u is prn

## 2015-02-01 LAB — CBC AND DIFFERENTIAL
HCT: 32 % — AB (ref 36–46)
HEMOGLOBIN: 9.8 g/dL — AB (ref 12.0–16.0)
Platelets: 408 10*3/uL — AB (ref 150–399)
WBC: 6.4 10*3/mL

## 2015-02-07 ENCOUNTER — Encounter: Payer: Self-pay | Admitting: Internal Medicine

## 2015-02-07 ENCOUNTER — Non-Acute Institutional Stay: Payer: Medicare Other | Admitting: Internal Medicine

## 2015-02-07 VITALS — BP 100/62 | HR 80 | Temp 99.2°F | Wt 174.0 lb

## 2015-02-07 DIAGNOSIS — J470 Bronchiectasis with acute lower respiratory infection: Secondary | ICD-10-CM

## 2015-02-07 DIAGNOSIS — F411 Generalized anxiety disorder: Secondary | ICD-10-CM | POA: Diagnosis not present

## 2015-02-07 DIAGNOSIS — K5901 Slow transit constipation: Secondary | ICD-10-CM

## 2015-02-07 DIAGNOSIS — N3281 Overactive bladder: Secondary | ICD-10-CM

## 2015-02-07 DIAGNOSIS — I480 Paroxysmal atrial fibrillation: Secondary | ICD-10-CM | POA: Diagnosis not present

## 2015-02-07 DIAGNOSIS — S32000D Wedge compression fracture of unspecified lumbar vertebra, subsequent encounter for fracture with routine healing: Secondary | ICD-10-CM

## 2015-02-07 DIAGNOSIS — I10 Essential (primary) hypertension: Secondary | ICD-10-CM

## 2015-02-07 MED ORDER — ALPRAZOLAM 0.5 MG PO TABS: 0.5000 mg | ORAL_TABLET | Freq: Four times a day (QID) | ORAL | Status: DC | PRN

## 2015-02-07 NOTE — Progress Notes (Signed)
Patient ID: [name] [name], female   DOB: 1930-10-27, 81 y.o.   MRN: [id number]   Location:  [facility]  Code Status: full code  Goals of Care:Advanced Directive information Type of Advance Directive: Living will;Healthcare Power of Attorney  Chief Complaint  Patient presents with  . Medical Management of Chronic Issues    pneumonia, lumbar fracture, fever, follow-up from Rehab     HPI: Patient is a 81 y.o. white female seen in the [facility] today for f/u on her bronchiectasis, possible lymph nodes in her neck, and her chronic back pain.    T 101.6 was put on levaquin.  Then on 4th started on cipro when T was 104.  Is on cipro and still having a low grade fever of 99.2.  She doesn't feel "worth a toot".   Is for bronchoscopy 7/19 after abx are completed.   She has not lost weight, but has no appetite.  She is having fewer chills and sweats since 7/3.  Can only stay up for an hour and 1/2 doing anything before she needs a nap due to extreme fatigue.  Hgb 9.8.  Her stepdaughter requests a b12 level.  I will also do an iron panel.   Not sleeping well with the xanax 0.5mg , but request a refill.    There were some concerns about lymphadenopathy on an xray she had, but she has no tenderness of her neck.    Still has her chronic back pain across her lower back.  She does not want to focus on this now--is too worried about her "lung problem."  Is using miralax for her bowels with success.    Review of Systems:  Review of Systems  Constitutional: Positive for fever, chills and diaphoresis. Negative for weight loss.  HENT: Negative for congestion.   Eyes: Negative for blurred vision.  Respiratory: Negative for cough, sputum production, shortness of breath and wheezing.   Cardiovascular: Negative for chest pain and leg swelling.  Gastrointestinal: Positive for constipation. Negative for abdominal pain, blood in stool and melena.  Genitourinary: Positive for frequency.  Negative for dysuria and urgency.       Less than previously  Musculoskeletal: Positive for back pain. Negative for falls.  Skin: Negative for itching and rash.  Neurological: Positive for weakness.    Past Medical History  Diagnosis Date  . COPD (chronic obstructive pulmonary disease)     pft's 09/15/2005, FEV1 1.69 (76%) with ration67 and better x 14% p B2, DLC 95%  . Hypertension   . Bronchitis, chronic   . Celiac disease   . Anxiety   . Pneumonia     hx of x3  . Acute blood loss anemia 09/07/13  . Anxiety state, unspecified   . Bronchiectasis 2012  . Cerebrovascular disease, unspecified 05/26/13  . Cerebral atrophy 05/26/13    Past Surgical History  Procedure Laterality Date  . Nasal sinus surgery  years ago  . Total knee arthroplasty Bilateral [id number]  . Excision morton's neuroma Bilateral 35 years ago, 2010  . Total hip arthroplasty Right 2009    Dr. Telford Nab  . Cataract extraction Bilateral ~8 years ago  . Total hip arthroplasty Left 09/06/2013    Dr. Salli Quarry    Social History:   reports that she quit smoking about 3 years ago. Her smoking use included Cigarettes. She has a 30 pack-year smoking history. She has never used smokeless tobacco. She reports that she drinks alcohol. She reports that she does  not use illicit drugs.  Allergies  Allergen Reactions  . Benicar [Olmesartan] Diarrhea and Other (See Comments)    Hypotension  . Metronidazole Nausea Only  . Morphine Hives    Medications: Patient's Medications  New Prescriptions   No medications on file  Previous Medications   ACETAMINOPHEN (TYLENOL) 650 MG CR TABLET    Take 1,300 mg by mouth every 8 (eight) hours as needed for pain.   ALBUTEROL (PROVENTIL HFA;VENTOLIN HFA) 108 (90 BASE) MCG/ACT INHALER    Inhale 1-2 puffs into the lungs every 6 (six) hours as needed for wheezing or shortness of breath.   ALPRAZOLAM (XANAX) 0.25 MG TABLET    Take 1 tablet (0.25 mg total) by mouth every 6 (six) hours as needed  for anxiety or sleep.   APIXABAN (ELIQUIS) 5 MG TABS TABLET    Take 2.5 mg by mouth 2 (two) times daily.   BUDESONIDE-FORMOTEROL (SYMBICORT) 160-4.5 MCG/ACT INHALER    Inhale 2 puffs into the lungs 2 (two) times daily.     CIPROFLOXACIN (CIPRO) 500 MG TABLET    Take one twice daily for infection   DILTIAZEM (CARDIZEM CD) 120 MG 24 HR CAPSULE    Take 120 mg by mouth every morning.    DIPHENHYDRAMINE-ACETAMINOPHEN (TYLENOL PM) 25-500 MG TABS    Take 2 tablets by mouth at bedtime.   DOCUSATE SODIUM (COLACE) 100 MG CAPSULE    Take 100 mg by mouth 2 (two) times daily.   DOFETILIDE (TIKOSYN) 250 MCG CAPSULE    Take 1 capsule (250 mcg total) by mouth 2 (two) times daily.   GLUCOSAMINE-CHONDROITIN 500-400 MG TABLET    Take 1 tablet by mouth 2 (two) times daily.     LOSARTAN (COZAAR) 100 MG TABLET    Take 50 mg by mouth every evening.    METOPROLOL SUCCINATE (TOPROL-XL) 50 MG 24 HR TABLET    Take 50 mg by mouth daily. Take with or immediately following a meal.   MULTIPLE VITAMINS-MINERALS (MULTIVITAMIN ADULT) TABS    Take by mouth. Take one tablet daily   POLYETHYL GLYCOL-PROPYL GLYCOL (SYSTANE OP)    Place 1 drop into both eyes every morning.   POLYETHYLENE GLYCOL (MIRALAX / GLYCOLAX) PACKET    Take 17 g by mouth daily.   SACCHAROMYCES BOULARDII (FLORASTOR PO)    Take by mouth. Probiotic take one twice daily  Modified Medications   No medications on file  Discontinued Medications   BIOTIN PO    Take 1,000 mcg by mouth daily.   CHOLECALCIFEROL (VITAMIN D3) 1000 UNITS CAPS    Take 1,000 Units by mouth daily.    DIPHENHYDRAMINE-PHENYLEPHRINE 25-10 MG TABS    Take 1 tablet by mouth at bedtime as needed (for sleep and decongestant).   GUAIFENESIN (MUCINEX MAXIMUM STRENGTH) 1200 MG TB12    Take 1 tablet by mouth at bedtime as needed (for cough).    SENNA (SENOKOT) 8.6 MG TABLET    Take 1 tablet by mouth daily.   TRAMADOL (ULTRAM) 50 MG TABLET    Take 50 mg by mouth every 4 (four) hours as needed.       Physical Exam: Filed Vitals:   02/07/15 1327  BP: 100/62  Pulse: 80  Temp: 99.2 F (37.3 C)  TempSrc: Oral  Weight: 174 lb (78.926 kg)  SpO2: 99%   Body mass index is 28.1 kg/(m^2). Physical Exam  Constitutional: She is oriented to person, place, and time.  HENT:  Head: Normocephalic and atraumatic.  Neck: Neck  supple.  Cardiovascular: Normal rate, regular rhythm, normal heart sounds and intact distal pulses.   Pulmonary/Chest: Effort normal and breath sounds normal. No respiratory distress. She has no wheezes. She has no rales.  Musculoskeletal: Normal range of motion. She exhibits tenderness.  Lower lumbar region  Lymphadenopathy:    She has no cervical adenopathy.  Neurological: She is alert and oriented to person, place, and time.  Psychiatric:  anxious     Labs reviewed: Basic Metabolic Panel:  Recent Labs  05/08/14 0044 05/08/14 0059  05/09/14 0349 05/10/14 0307 07/18/14 2357 07/19/14 0400 12/24/14 1210  01/15/15 01/17/15 01/25/15  NA 138  --   < > 141 139 138 136* 132*  < > 129* 125* 138  K 4.0  --   < > 4.0 4.2 3.9 3.8 4.7  < > 4.2 3.8 5.1  CL 97  --   < > 101 98 98 97 94*  --   --   --   --   CO2 27  --   < > 28 27 24 27 27   --   --   --   --   GLUCOSE 105*  --   < > 107* 100* 105* 111* 122*  --   --   --   --   BUN 17  --   < > 16 20 13 11 19   < > 17 13 19   CREATININE 0.52  --   < > 0.55 0.69 0.50 0.47* 0.77  < > 0.8 0.6 0.8  CALCIUM 9.2  --   < > 9.4 9.1 9.0 8.9 9.0  --   --   --   --   MG 2.1  --   --  2.2 2.2  --   --   --   --   --   --   --   PHOS  --   --   --   --   --   --  3.0  --   --   --   --   --   TSH  --  2.470  --   --   --   --  2.180  --   --   --   --   --   < > = values in this interval not displayed. Liver Function Tests:  Recent Labs  07/18/14 2357 07/19/14 0400 12/24/14 1210  AST 19 19 25   ALT 11 11 16   ALKPHOS 54 57 50  BILITOT 0.4 0.5 0.6  PROT 6.8 6.7 7.4  ALBUMIN 3.4* 3.3* 3.3*   No results for input(s):  LIPASE, AMYLASE in the last 8760 hours. No results for input(s): AMMONIA in the last 8760 hours. CBC:  Recent Labs  07/18/14 2357 07/19/14 0400 12/24/14 1210 01/15/15 01/25/15 02/01/15  WBC 9.5 8.4 9.8 7.9 6.4 6.4  NEUTROABS 7.4 6.4 8.3*  --   --   --   HGB 11.0* 10.6* 10.2* 9.4* 9.2* 9.8*  HCT 34.6* 33.7* 33.5* 28* 29* 32*  MCV 98.0 98.5 95.7  --   --   --   PLT 210 200 264 236 482* 408*  Procedures since last appt: 01/26/15:  CXR:  No active cardiopulmonary disease. Persistent patchy density in the left apex and lower lungs as described on previous CT scan likely waxing and waning chronic atypical infectious or inflammatory process. Possible progressive mild left neck base adenopathy. Recommend neck CT with contrast for further evaluation this patient with smoking  History.  Patient Care Team: Gayland Curry, DO as PCP - General (Geriatric Medicine) [facility] Paralee Cancel, MD as Consulting Physician (Orthopedic Surgery) Jacolyn Reedy, MD as Consulting Physician (Cardiology) Royal Hawthorn, NP as Nurse Practitioner (Nurse Practitioner) Tanda Rockers, MD as Consulting Physician (Pulmonary Disease)  Assessment/Plan 1. Bronchiectasis with acute lower respiratory infection -complete course of cipro as planned and cont florastor -keep appt for bronchoscopy and lavage 7/19 with Dr. Melvyn Novas to determine cause of infection (?pseudomonas, MAI) -is still having low grade temp even on cipro now -of note, she had no palpable lymphadenopathy or throat tenderness  2. Essential hypertension -bp is at goal, not too low at present  3. Paroxysmal atrial fibrillation -stable and asymptomatic, was regular today, cont eliquis and toprol xl  4. Lumbar compression fracture, with routine healing, subsequent encounter -persists--not using tylenol and hydrocodone due to monitoring temps  5. Slow transit constipation -cont miralax for this  6. Overactive bladder -has  improved since she has been home from rehab  7. Anxiety state -requests xanax 0.5mg  every 6 hrs as needed for her anxiety--this was called in  Labs/tests ordered:  Add b12/folate and iron panel to assess anemia   Next appt:  6 wks  Ting Cage L. Macie Baum, D.O. [facility] [address], [place] [address] Cell Phone (Mon-Fri 8am-5pm):  [phone] On Call:  [phone] & follow prompts after 5pm & weekends Office Phone:  [phone] Office Fax:  [phone]

## 2015-02-09 ENCOUNTER — Telehealth: Payer: Self-pay

## 2015-02-09 NOTE — Telephone Encounter (Signed)
Spoke with patient about labs, B12 is normal 509, the Iron 19 (normal 42-145). Dr. Mariea Clonts wants her to start Nu Iron 150 mg one tablet daily, it shouldn't be as constipating as Ferrous Sulfate.  This is over the counter but you have to ask the pharmacist for it.  Patient was going to have the her daughter pick it up for her.

## 2015-02-09 NOTE — Telephone Encounter (Signed)
Left message for patient to call back for lab results

## 2015-02-12 ENCOUNTER — Telehealth: Payer: Self-pay | Admitting: *Deleted

## 2015-02-12 NOTE — Telephone Encounter (Signed)
Patient called and daughter in [place] wants you to call her regarding her mother. Daughter is a Marine scientist and has questions. Please Call her at #: [phone].

## 2015-02-13 ENCOUNTER — Encounter: Payer: Self-pay | Admitting: Internal Medicine

## 2015-02-13 ENCOUNTER — Ambulatory Visit: Payer: Medicare Other

## 2015-02-19 ENCOUNTER — Telehealth: Payer: Self-pay | Admitting: Internal Medicine

## 2015-02-19 NOTE — Telephone Encounter (Signed)
Spoke with pt. Advised her of the instructions that I know of but gave her the number to respiratory ([phone]) to get detailed instructions. She agreed and verbalized understanding. Nothing further was needed.

## 2015-02-20 ENCOUNTER — Ambulatory Visit (HOSPITAL_COMMUNITY)
Admission: RE | Admit: 2015-02-20 | Discharge: 2015-02-20 | Disposition: A | Discharge status: Home / Self Care | Payer: Medicare Other | Source: Ambulatory Visit | Attending: Internal Medicine | Admitting: Internal Medicine

## 2015-02-20 ENCOUNTER — Ambulatory Visit (HOSPITAL_COMMUNITY): Payer: Medicare Other

## 2015-02-20 ENCOUNTER — Encounter (HOSPITAL_COMMUNITY)
Admission: RE | Disposition: A | Discharge status: Home / Self Care | Payer: Self-pay | Source: Ambulatory Visit | Attending: Internal Medicine

## 2015-02-20 DIAGNOSIS — Z7951 Long term (current) use of inhaled steroids: Secondary | ICD-10-CM | POA: Insufficient documentation

## 2015-02-20 DIAGNOSIS — Z79899 Other long term (current) drug therapy: Secondary | ICD-10-CM | POA: Insufficient documentation

## 2015-02-20 DIAGNOSIS — I10 Essential (primary) hypertension: Secondary | ICD-10-CM | POA: Insufficient documentation

## 2015-02-20 DIAGNOSIS — Z96659 Presence of unspecified artificial knee joint: Secondary | ICD-10-CM | POA: Diagnosis not present

## 2015-02-20 DIAGNOSIS — Z96649 Presence of unspecified artificial hip joint: Secondary | ICD-10-CM | POA: Insufficient documentation

## 2015-02-20 DIAGNOSIS — Z87891 Personal history of nicotine dependence: Secondary | ICD-10-CM | POA: Insufficient documentation

## 2015-02-20 DIAGNOSIS — J479 Bronchiectasis, uncomplicated: Secondary | ICD-10-CM | POA: Insufficient documentation

## 2015-02-20 HISTORY — PX: VIDEO BRONCHOSCOPY: SHX5072

## 2015-02-20 SURGERY — VIDEO BRONCHOSCOPY WITHOUT FLUORO
Anesthesia: Moderate Sedation | Laterality: Bilateral

## 2015-02-20 MED ORDER — LIDOCAINE HCL 2 % EX GEL
CUTANEOUS | Status: DC | PRN
  Administered 2015-02-20: 1

## 2015-02-20 MED ORDER — MEPERIDINE HCL 100 MG/ML IJ SOLN
INTRAMUSCULAR | Status: AC
  Filled 2015-02-20: qty 2

## 2015-02-20 MED ORDER — LIDOCAINE HCL 2 % EX GEL: 1.0000 "application " | Freq: Once | CUTANEOUS | Status: DC

## 2015-02-20 MED ORDER — SODIUM CHLORIDE 0.9 % IV SOLN
INTRAVENOUS | Status: DC
  Administered 2015-02-20: 08:00:00 via INTRAVENOUS

## 2015-02-20 MED ORDER — MIDAZOLAM HCL 5 MG/ML IJ SOLN: 1.0000 mg | Freq: Once | INTRAMUSCULAR | Status: DC

## 2015-02-20 MED ORDER — MIDAZOLAM HCL 5 MG/ML IJ SOLN
INTRAMUSCULAR | Status: AC
  Filled 2015-02-20: qty 2

## 2015-02-20 MED ORDER — PHENYLEPHRINE HCL 0.25 % NA SOLN: 1.0000 | Freq: Four times a day (QID) | NASAL | Status: DC | PRN

## 2015-02-20 MED ORDER — MEPERIDINE HCL 25 MG/ML IJ SOLN
INTRAMUSCULAR | Status: DC | PRN
  Administered 2015-02-20: 25 mg via INTRAVENOUS

## 2015-02-20 MED ORDER — PHENYLEPHRINE HCL 0.25 % NA SOLN
NASAL | Status: DC | PRN
  Administered 2015-02-20: 2 via NASAL

## 2015-02-20 MED ORDER — LIDOCAINE HCL 1 % IJ SOLN
INTRAMUSCULAR | Status: DC | PRN
  Administered 2015-02-20: 6 mL via RESPIRATORY_TRACT

## 2015-02-20 MED ORDER — MIDAZOLAM HCL 5 MG/ML IJ SOLN
INTRAMUSCULAR | Status: DC | PRN
  Administered 2015-02-20 (×2): 2.5 mg via INTRAVENOUS

## 2015-02-20 MED ORDER — MEPERIDINE HCL 100 MG/ML IJ SOLN: 100.0000 mg | Freq: Once | INTRAMUSCULAR | Status: DC

## 2015-02-20 NOTE — Discharge Instructions (Signed)
Flexible Bronchoscopy, Care After These instructions give you information on caring for yourself after your procedure. Your doctor may also give you more specific instructions. Call your doctor if you have any problems or questions after your procedure. HOME CARE  Do not eat or drink anything for 2 hours after your procedure. If you try to eat or drink before the medicine wears off, food or drink could go into your lungs. You could also burn yourself.  After 2 hours have passed and when you can cough and gag normally, you may eat soft food and drink liquids slowly.  The day after the test, you may eat your normal diet.  You may do your normal activities.  Keep all doctor visits. GET HELP RIGHT AWAY IF:  You get more and more short of breath.  You get light-headed.  You feel like you are going to pass out (faint).  You have chest pain.  You have new problems that worry you.  You cough up more than a little blood.  You cough up more blood than before. MAKE SURE YOU:  Understand these instructions.  Will watch your condition.  Will get help right away if you are not doing well or get worse. Document Released: 05/18/2009 Document Revised: 07/26/2013 Document Reviewed: 03/25/2013 [facility] Patient Information 2015 [place], [place]. This information is not intended to replace advice given to you by your health care provider. Make sure you discuss any questions you have with your health care provider.  Noting to eat or drink until    10:30 am      Today   02/20/2015

## 2015-02-20 NOTE — Progress Notes (Signed)
Video Bronchoscopy done  Intervention Bronchial washings done Procedure tolerated well

## 2015-02-20 NOTE — Op Note (Signed)
Bronchoscopy Procedure Note  Date of Operation: 02/20/2015   Pre-op Diagnosis: ? MAI  Post-op Diagnosis: same  Surgeon: Christinia Gully  Anesthesia: Monitored Local Anesthesia with Sedation  Operation: Video Flexible fiberoptic bronchoscopy, diagnostic   Findings: nl airways/ no purulent secretions   Specimen: BAL LUL  Estimated Blood Loss: non  Complications: none  Indications and History: See updated H and P same date. The risks, benefits, complications, treatment options and expected outcomes were discussed with the patient.  The possibilities of reaction to medication, pulmonary aspiration, perforation of a viscus, bleeding, failure to diagnose a condition and creating a complication requiring transfusion or operation were discussed with the patient who freely signed the consent.    Description of Procedure: The patient was re-examined in the bronchoscopy suite and the site of surgery properly noted/marked.  The patient was identified  and the procedure verified as Flexible Fiberoptic Bronchoscopy.  A Time Out was held and the above information confirmed.   After the induction of topical nasopharyngeal anesthesia, the patient was positioned  and the bronchoscope was passed through the R naris. The vocal cords were visualized and  1% buffered lidocaine 5 ml was topically placed onto the cords. The cords were nl . The scope was then passed into the trachea.  1% buffered lidocaine given topically. Airways inspected bilaterally to the subsegmental level with the following findings:  All airways were pristine/ no excess or purulent secretions    Interventions:  BAL multiple segments LUL  Post/apical and Lingula with limited return     The Patient was taken to the Endoscopy Recovery area in satisfactory condition.  Attestation: I performed the procedure.  Christinia Gully, MD Pulmonary and [place] [phone] After 5:30 PM or weekends, call  [phone]

## 2015-02-20 NOTE — H&P (Addendum)
Brief patient profile:  12 yowf former Architect at [employer] quit smoking almost completely July 2012 With GOLD II copd by pft's 11/11/10 and residing independently at [facility]    History of Present Illness  06/25/2012 f/u ov/[name] [name] Quit smoking 02/2011 cc indolent onset x 6 months progressively worse to point of doe x one flight of steps but no need to stop at top, maintained on symbicort 2 bid not using albuterol at all.  rec tudorza is one puff twice daily> no better so d/c'd Only use your albuterol as a rescue medication    07/01/2013 f/u ov/[name] [name] re: copd/ lingular bronchiectasis/ ? MAI  Chief Complaint  Patient presents with  . Follow-up    Pt c/o congestion, some SOB with exertion. s/s have improved since lv   Not using symbicort first thing Only using rescue now 2-3 x since previous ov  May need joint replacement  Has sense of chest congestion but no excess or purulent secretions  rec Plan A = automatic = Symbicort 160 Take 2 puffs first thing in am and then another 2 puffs about 12 hours later.  Plan B= Backup = Ventolin = albuterol   01/10/2015 Consultation post admit to SNF /[name] [name] re: Copd GOLD II previously with ? mai on symbicort 160 2 bid only maint rx Chief Complaint  Patient presents with  . Follow-up    Pt states she was dxed with PNA 12/24/14- had fever, chills, sweats, and cough. She was txed with levaquin. She states now has very min cough with clear sputum. Her breathing is at baseline for her. She c/o feeling fatigued. She uses albuterol inhaler 5 x per wk on average.   much better since in SNF/ able to walk with walker with PT i F but c/o mild/ moderate orthostasis worse p diuresis/ Dr Wynonia Lawman notified No longer doing pool ex x one year due to wound healing L ankle Using alb once every 5 days with excellent hfa (see a/p)  rec No change rx     01/26/2015 f/u ov/[name] [name] re: bronchiectasis/ recent RUL pna/ on cipro x 10 day  course  Chief Complaint  Patient presents with  . Follow-up    Breathing is unchanged. She is coughing very little- with clear sputum. She still feels fatigued and relates this to her iron def.   Not limited by breathing from desired activities But not very active at this point  No obvious daytime variabilty or assoc excess or purulent or bloody sputum or cp or chest tightness, subjective wheeze overt hb symptoms. No unusual exp hx or h/o childhood pna/ asthma or premature birth to her knowledge.   Sleeping ok without nocturnal or early am exacerbation of respiratory c/o's or need for noct saba. Also denies any obvious fluctuation of symptoms with weather or environmental changes or other aggravating or alleviating factors except as outlined above   ROS The following are not active complaints unless bolded sore throat, dysphagia, dental problems, itching, sneezing, nasal congestion or excess/ purulent secretions, ear ache, fever, chills, sweats, unintended wt loss, pleuritic or exertional cp, hemoptysis, orthopnea pnd or leg swelling, presyncope, palpitations, heartburn, abdominal pain, anorexia, nausea, vomiting, diarrhea or change in bowel or urinary habits, change in stools or urine, dysuria,hematuria, rash, arthralgias, visual complaints, headache, numbness weakness or ataxia or problems with walking or coordination, change in mood/affect or memory.     Past Medical History:  COPD (ICD-496)  - PFT's 09/15/2005 FEV1 1.69 (76%) with ratio 67 and better x  14% p B2, DLC 95%  - PFT's 11/11/2010 FEV1 1.32 (63%) with ratio 62 and no better p B2, DLC0 66% - HFA 90% 11/11/2010  COUGH, CHRONIC (ICD-786.2)  HYPERTENSION (ICD-401.9)    Past Surgical History:  Post Sinus surgery by Dr Harle Battiest in 2002  2 remote knee replacements  [facility] Neuroma repair on her left foot  Rt hip replaced 2009   Family History:  Heart dz- Father (passed with MI at age 87)  Prostate  CA- PGF  PF brother former smoker   Social History:  Widowed  3 children  Retired Solicitor  ETOH- wine and gin daily        Objective:  Physical Exam    Wt 188 September 26, 2010 > 185 11/11/2010 >188 02/26/2011 > 06/25/2012 189 > 184 08/25/2012 > 186 06/03/2013 > 07/01/2013 187 > 08/11/13 183> 01/10/2015 176 > 01/26/2015 175   Vital signs reviewed   thin pleasant amb wf nad using rolling walker  HEENT: nl dentition, turbinates, and orophanx. Nl external ear canals without cough reflex   NECK : without JVD/Nodes/TM/ nl carotid upstrokes bilaterally   LUNGS: chest slt barrel shaped, distant bs with mod increased Texp s wheezes/ no crackles on insp    CV: RRR no s3 or murmur or increase in P2, no edema   ABD: soft and nontender with nl excursion in the supine position. No bruits or organomegaly, bowel sounds nl  MS: warm without deformities, calf tenderness, cyanosis or clubbing  SKIN: warm and dry without lesions   NEURO: alert, approp, no deficits       Dec 25, 2014 cxr pos for infiltrates > levaquin x 10 days > Stopped January 04 2015    I personally reviewed images and agree with radiology impression as follows:  CXR: 01/26/15 No active cardiopulmonary disease. Persistent patchy density in the left apex and lower lungs as described on previous CT scan likely waxing and waning chronic atypical infectious or inflammatory process.              Assessment & Plan:              Bronchiectasis with possible MAI - [name] Rockers, MD at 01/28/2015 6:29 AM     Status: [name] [name] Related Problem: Bronchiectasis with possible MAI    Expand All Collapse All   See CT Chest 05/30/13 And 11/27/14 -Increasing mediastinal lymphadenopathy, favored to be reactive. There is a spectrum of findings in the lungs suggestive of a progressive chronic indolent atypical infectious process such as mycobacterium avium  intracellulare (MAI).   I had an extended summary discussion with the patient and daughter reviewing all relevant studies completed to date and lasting 15 to 20 minutes of a 25 minute visit on the following ongoing concerns:  1) pathyophysiology of bronchiectasis/ MAI reviewed using the "punishment should fit the crime" analogy  2) she gets very rapidly better with infiltrates gone in days on abx so that's more typical of a bacterial process like an opportunistic GNR than MAI at this point, though she is at risk of both  3) we need to be sure she's received prevnar 13 and if not provide it for her at [place]  4) I told her I thought [facility] was [facility] and should depend on Dr Mariea Clonts and her team for further guidance as far as micromanaging her problems and rely less on specialists since we are not seeing her "real time" Whereas Dr Mariea Clonts is and  also we are not able to look at the "big picture" as well since we are the specialists and Dr Mariea Clonts can decide which specialists are appropriate at this point and better coordinate her care.   5) only remaining pulmonary issue is whether fever/ infiltrates will recur - if it happens early in the week we can arrange fob the next day and hold abx until seen, if not I would do the cipro 500 bid x 10 days again             Date/Time User Action    > 01/28/2015 6:33 AM [name] Rockers, MD Edit     01/28/2015 6:30 AM [name] Rockers, MD Edit     01/28/2015 6:29 AM [name] Rockers, MD Create              COPD GOLD II - [name] Rockers, MD at 01/28/2015 6:30 AM     Status: Written Related Problem: COPD GOLD II   Expand All Collapse All   Quit smoking 02/2011  - PFT's 09/15/2005 FEV1 1.69 (76%) with ratio 67 and better x 14% p B2, DLC 95%  - PFT's 11/11/2010 FEV1 1.32 (63%) with ratio 62 and no better p B2, DLC0 66%  - PFT's 08/25/2012 FEV1 1.56 (76%) with ratio 66 and no better p B2, DLCO 88% - PFTs 08/12/2013  FEV1 1.37 (64%) ratio 64 with no better B2, DLCO 59 corrects 77% p am symbicort - Add tudorza 06/25/2012 > no better so d/cd - 01/10/2015 p extensive coaching HFA effectiveness = 90%  Doing well on symbicort 160 2 bid and prn saba         02/20/2015 day of FOB mucus borderline green but no fever since off abx/ cxr no acute change No other changes hx or exam    Christinia Gully, MD Pulmonary and [place] [phone] After 5:30 PM or weekends, call [phone]

## 2015-02-21 ENCOUNTER — Telehealth: Payer: Self-pay

## 2015-02-21 NOTE — Telephone Encounter (Signed)
Received message from [place], patient wants to increase her Nu-Iron to twice daily. Dr. Mariea Clonts said that would be ok, but caution her, she might get a little constipation. Called the patient, she's not having any trouble with constipation, take the Nu-Iron twice daily. She is just so weak, she feels she needs to increase the iron. Keep appt in August. Let us know if this doesn't help.

## 2015-02-22 ENCOUNTER — Encounter (HOSPITAL_COMMUNITY): Payer: Self-pay | Admitting: Internal Medicine

## 2015-02-22 LAB — CULTURE, RESPIRATORY: SPECIAL REQUESTS: NORMAL

## 2015-02-22 LAB — CULTURE, RESPIRATORY W GRAM STAIN

## 2015-02-23 ENCOUNTER — Telehealth: Payer: Self-pay

## 2015-02-23 ENCOUNTER — Emergency Department (HOSPITAL_COMMUNITY): Payer: Medicare Other

## 2015-02-23 ENCOUNTER — Encounter (HOSPITAL_COMMUNITY): Payer: Self-pay | Admitting: Emergency Medicine

## 2015-02-23 ENCOUNTER — Emergency Department (HOSPITAL_COMMUNITY)
Admission: EM | Admit: 2015-02-23 | Discharge: 2015-02-23 | Disposition: A | Discharge status: Home / Self Care | Payer: Medicare Other | Attending: Emergency Medicine | Admitting: Emergency Medicine

## 2015-02-23 DIAGNOSIS — Z79899 Other long term (current) drug therapy: Secondary | ICD-10-CM | POA: Diagnosis not present

## 2015-02-23 DIAGNOSIS — F419 Anxiety disorder, unspecified: Secondary | ICD-10-CM | POA: Diagnosis not present

## 2015-02-23 DIAGNOSIS — Z8719 Personal history of other diseases of the digestive system: Secondary | ICD-10-CM | POA: Insufficient documentation

## 2015-02-23 DIAGNOSIS — Z7902 Long term (current) use of antithrombotics/antiplatelets: Secondary | ICD-10-CM | POA: Insufficient documentation

## 2015-02-23 DIAGNOSIS — Z8701 Personal history of pneumonia (recurrent): Secondary | ICD-10-CM | POA: Diagnosis not present

## 2015-02-23 DIAGNOSIS — J449 Chronic obstructive pulmonary disease, unspecified: Secondary | ICD-10-CM | POA: Diagnosis not present

## 2015-02-23 DIAGNOSIS — Z7951 Long term (current) use of inhaled steroids: Secondary | ICD-10-CM | POA: Insufficient documentation

## 2015-02-23 DIAGNOSIS — I10 Essential (primary) hypertension: Secondary | ICD-10-CM | POA: Diagnosis not present

## 2015-02-23 DIAGNOSIS — D649 Anemia, unspecified: Secondary | ICD-10-CM | POA: Diagnosis not present

## 2015-02-23 DIAGNOSIS — Z87891 Personal history of nicotine dependence: Secondary | ICD-10-CM | POA: Diagnosis not present

## 2015-02-23 DIAGNOSIS — I4891 Unspecified atrial fibrillation: Secondary | ICD-10-CM | POA: Insufficient documentation

## 2015-02-23 DIAGNOSIS — R531 Weakness: Secondary | ICD-10-CM | POA: Insufficient documentation

## 2015-02-23 DIAGNOSIS — Z8669 Personal history of other diseases of the nervous system and sense organs: Secondary | ICD-10-CM | POA: Diagnosis not present

## 2015-02-23 HISTORY — DX: Aortic aneurysm of unspecified site, without rupture: I71.9

## 2015-02-23 HISTORY — DX: Unspecified atrial fibrillation: I48.91

## 2015-02-23 LAB — URINE MICROSCOPIC-ADD ON

## 2015-02-23 LAB — CBC
HEMATOCRIT: 31 % — AB (ref 36.0–46.0)
Hemoglobin: 9.5 g/dL — ABNORMAL LOW (ref 12.0–15.0)
MCH: 27.2 pg (ref 26.0–34.0)
MCHC: 30.6 g/dL (ref 30.0–36.0)
MCV: 88.8 fL (ref 78.0–100.0)
Platelets: 283 10*3/uL (ref 150–400)
RBC: 3.49 MIL/uL — ABNORMAL LOW (ref 3.87–5.11)
RDW: 15.4 % (ref 11.5–15.5)
WBC: 7 10*3/uL (ref 4.0–10.5)

## 2015-02-23 LAB — URINALYSIS, ROUTINE W REFLEX MICROSCOPIC
Bilirubin Urine: NEGATIVE
Glucose, UA: NEGATIVE mg/dL
Ketones, ur: NEGATIVE mg/dL
Nitrite: NEGATIVE
PROTEIN: NEGATIVE mg/dL
Specific Gravity, Urine: 1.014 (ref 1.005–1.030)
Urobilinogen, UA: 0.2 mg/dL (ref 0.0–1.0)
pH: 7 (ref 5.0–8.0)

## 2015-02-23 LAB — BASIC METABOLIC PANEL
ANION GAP: 9 (ref 5–15)
BUN: 17 mg/dL (ref 6–20)
CO2: 26 mmol/L (ref 22–32)
CREATININE: 0.65 mg/dL (ref 0.44–1.00)
Calcium: 8.9 mg/dL (ref 8.9–10.3)
Chloride: 97 mmol/L — ABNORMAL LOW (ref 101–111)
GFR calc non Af Amer: >60 mL/min (ref >60)
Glucose, Bld: 103 mg/dL — ABNORMAL HIGH (ref 65–99)
Potassium: 3.9 mmol/L (ref 3.5–5.1)
Sodium: 132 mmol/L — ABNORMAL LOW (ref 135–145)

## 2015-02-23 MED ORDER — SODIUM CHLORIDE 0.9 % IV BOLUS (SEPSIS)
500.0000 mL | Freq: Once | INTRAVENOUS | Status: AC
  Administered 2015-02-23: 500 mL via INTRAVENOUS

## 2015-02-23 NOTE — ED Notes (Signed)
Pt is alert and oriented in NAD,  Pt requested not to have another IV , states she had rather drink water ,  Per Dr Canary Brim this is ok.  Pt given ice water

## 2015-02-23 NOTE — ED Provider Notes (Signed)
CSN: [id number]     Arrival date & time 02/23/15  1702 History   First MD Initiated Contact with Patient 02/23/15 1726     Chief Complaint  Patient presents with  . Weakness     (Consider location/radiation/quality/duration/timing/severity/associated sxs/prior Treatment) HPI  Pt presents with c/o generalized weakness.  She states she had temp at [place] earlier today of 100.9.  She has been having intermittent fevers and has been treated for pneumonia x 4 since April 2016.  She states she finished her last course of antibiotics 5 days ago.  She has been feeling generally weak throughout the illness.  Had a bronchoscopy 4 days ago by Dr. Melvyn Novas and results are negative.  No cough, no difficulty breathing.  No vomiting or diarrhea.  No abdominal pain.  No rashes or joint pain.  She denies dysuria.  She also states her blood pressure has been running high- she took her evening doses of meds this afternoon due to her concern that BP is running high.  No syncope.  There are no other associated systemic symptoms, there are no other alleviating or modifying factors.   Past Medical History  Diagnosis Date  . COPD (chronic obstructive pulmonary disease)     pft's 09/15/2005, FEV1 1.69 (76%) with ration67 and better x 14% p B2, DLC 95%  . Hypertension   . Bronchitis, chronic   . Celiac disease   . Anxiety   . Pneumonia     hx of x3  . Acute blood loss anemia 09/07/13  . Anxiety state, unspecified   . Bronchiectasis 2012  . Cerebrovascular disease, unspecified 05/26/13  . Cerebral atrophy 05/26/13  . Anemia   . Atrial fibrillation   . Aneurysm of aorta    Past Surgical History  Procedure Laterality Date  . Nasal sinus surgery  years ago  . Total knee arthroplasty Bilateral [id number]  . Excision morton's neuroma Bilateral 35 years ago, 2010  . Total hip arthroplasty Right 2009    Dr. Telford Nab  . Cataract extraction Bilateral ~8 years ago  . Total hip arthroplasty Left 09/06/2013    Dr. Salli Quarry  . Video bronchoscopy Bilateral 02/20/2015    Procedure: VIDEO BRONCHOSCOPY WITHOUT FLUORO;  Surgeon: Tanda Rockers, MD;  Location: [facility];  Service: Cardiopulmonary;  Laterality: Bilateral;   Family History  Problem Relation Age of Onset  . Heart disease Father 31    heart attack  . Prostate cancer Paternal Grandfather   . Pulmonary fibrosis Brother     former smoker   History  Substance Use Topics  . Smoking status: Former Smoker -- 0.50 packs/day for 60 years    Types: Cigarettes    Quit date: 01/27/2012  . Smokeless tobacco: Never Used  . Alcohol Use: Yes     Comment: Wine and Gin daily   OB History    No data available     Review of Systems  ROS reviewed and all otherwise negative except for mentioned in HPI    Allergies  Benicar; Metronidazole; and Morphine  Home Medications   Prior to Admission medications   Medication Sig Start Date End Date Taking? Authorizing Provider  acetaminophen (TYLENOL) 650 MG CR tablet Take 1,300 mg by mouth every morning.    Yes Historical Provider, MD  albuterol (PROVENTIL HFA;VENTOLIN HFA) 108 (90 BASE) MCG/ACT inhaler Inhale 1-2 puffs into the lungs every 6 (six) hours as needed for wheezing or shortness of breath. 08/14/14  Yes Tanda Rockers, MD  ALPRAZolam (XANAX) 0.5 MG tablet Take 1 tablet (0.5 mg total) by mouth every 6 (six) hours as needed for anxiety or sleep. 02/07/15  Yes Tiffany L Reed, DO  BISACODYL PO Take by mouth at bedtime.   Yes Historical Provider, MD  budesonide-formoterol (SYMBICORT) 160-4.5 MCG/ACT inhaler Inhale 2 puffs into the lungs 2 (two) times daily.     Yes Historical Provider, MD  diltiazem (CARDIZEM CD) 120 MG 24 hr capsule Take 120 mg by mouth every morning.  05/05/14  Yes Historical Provider, MD  diphenhydramine-acetaminophen (TYLENOL PM) 25-500 MG TABS Take 2 tablets by mouth at bedtime.   Yes Historical Provider, MD  dofetilide (TIKOSYN) 250 MCG capsule Take 1 capsule (250 mcg total) by mouth 2  (two) times daily. 05/10/14  Yes Jacolyn Reedy, MD  ELIQUIS 5 MG TABS tablet Take 2.5 mg by mouth 2 (two) times daily. 12/26/14  Yes Historical Provider, MD  glucosamine-chondroitin 500-400 MG tablet Take 1 tablet by mouth 2 (two) times daily.     Yes Historical Provider, MD  iron polysaccharides (NIFEREX) 150 MG capsule Take 150 mg by mouth. Take one tablet daily for iron   Yes Historical Provider, MD  losartan (COZAAR) 100 MG tablet Take 50 mg by mouth every evening.    Yes Historical Provider, MD  metoprolol succinate (TOPROL-XL) 50 MG 24 hr tablet Take 50 mg by mouth daily. Take with or immediately following a meal.   Yes Historical Provider, MD  Multiple Vitamins-Minerals (MULTIVITAMIN ADULT) TABS Take by mouth. Take one tablet daily   Yes Historical Provider, MD  Polyethyl Glycol-Propyl Glycol (SYSTANE OP) Place 1 drop into both eyes every morning.   Yes Historical Provider, MD  polyethylene glycol (MIRALAX / GLYCOLAX) packet Take 17 g by mouth daily.   Yes Historical Provider, MD  Saccharomyces boulardii (FLORASTOR PO) Take by mouth. Probiotic take one twice daily   Yes Historical Provider, MD  spironolactone (ALDACTONE) 25 MG tablet Take 25 mg by mouth daily. 12/26/14  Yes Historical Provider, MD  ciprofloxacin (CIPRO) 500 MG tablet Take 500 mg by mouth 2 (two) times daily.    Historical Provider, MD   BP 177/98 mmHg  Pulse 100  Temp(Src) 98.9 F (37.2 C) (Oral)  Resp 16  SpO2 97%  Vitals reviewed Physical Exam  Physical Examination: General appearance - alert, well appearing, and in no distress Mental status - alert, oriented to person, place, and time Eyes  No conjunctival injection, no scleral icterus Mouth - mucous membranes moist, pharynx normal without lesions Chest - clear to auscultation, no wheezes, rales or rhonchi, symmetric air entry Heart - normal rate, regular rhythm, normal S1, S2, no murmurs, rubs, clicks or gallops Abdomen - soft, nontender, nondistended, no masses  or organomegaly Neurological - alert, oriented x 3, cranial nerves grossly intact, strength 5/5 in extremities x 4, sensation intact Extremities - peripheral pulses normal, no pedal edema, no clubbing or cyanosis Skin - normal coloration and turgor, no rashes  ED Course  Procedures (including critical care time) Labs Review Labs Reviewed  BASIC METABOLIC PANEL - Abnormal; Notable for the following:    Sodium 132 (*)    Chloride 97 (*)    Glucose, Bld 103 (*)    All other components within normal limits  CBC - Abnormal; Notable for the following:    RBC 3.49 (*)    Hemoglobin 9.5 (*)    HCT 31.0 (*)    All other components within normal limits  URINALYSIS, ROUTINE W  REFLEX MICROSCOPIC (NOT AT [facility]) - Abnormal; Notable for the following:    APPearance CLOUDY (*)    Hgb urine dipstick TRACE (*)    Leukocytes, UA TRACE (*)    All other components within normal limits  URINE MICROSCOPIC-ADD ON    Imaging Review Dg Chest 2 View  02/23/2015   CLINICAL DATA:  Fever and weakness  EXAM: CHEST  2 VIEW  COMPARISON:  02/20/2015  FINDINGS: Heart size is normal. The aorta is unfolded and ectatic. Patchy bibasilar airspace opacities are stable. No pleural effusion. Left shoulder probable loose bodies. No acute osseous abnormality.  IMPRESSION: Stable patchy bilateral lower lobe airspace opacities. Review of prior exams dating back to April demonstrates chronicity of these findings, which may occur with atypical infectious organisms or scarring.   Electronically Signed   By: Conchita Paris M.D.   On: 02/23/2015 19:02     EKG Interpretation None      MDM   Final diagnoses:  Weakness  Essential hypertension    Pt presents with ongoing generalized weakness which has been present for the past 3 months.  She has had multiple rounds of abx for presumed pneumonia, finished cipro 5 days ago.  Had bronchoscopy earlier this week with normal results per chart review.  CXR today shows no change from  prior xrays, likely scar tissue- temp of 100.9 earlier today- no fever here in the ED.  Urinalysis shows no UA- no other localizing findings for source of infection.  Blood cultures obtained although doubt bacteremia due to chronic nature of symptoms.  No findings of end organ damage due to hypertension- no focal neurologic abnormalities, no chest pain or shortness of breath, no syncope.  Renal function reassuring.  Anemia is present but at her baseline.  Long d/w patient and family about all results and need for close outpatient followup.  Discharged with strict return precautions.  Pt agreeable with plan.    Alfonzo Beers, MD 02/23/15 [phone]

## 2015-02-23 NOTE — Discharge Instructions (Signed)
Return to the ED with any concerns including fainting, chest pain, difficulty breathing, vomiting and not able to keep down liquids, decreased level of alertness/lethargy, or any other alarming symptoms °

## 2015-02-23 NOTE — ED Notes (Signed)
Per EMS. Pt from home. Reports she was diagnosed with PNA 6 weeks ago and had to have 4 courses of abx to clear PNA. Pt has been off abx for last 2 weeks. Reports she had a fever at home of 100.76F with generalized weakness.

## 2015-02-23 NOTE — Telephone Encounter (Signed)
[name] called, yesterday (02/22/15) patient went to clinic complaining of being so tire, BP 170/105. Mliss Sax [facility] clinic nurse) called Dr.Tilley's office. Didn't get an answer so Mliss Sax asked Christy NP, she told her to resume Spironolactone 25 mg one every morning. This morning patient came back to clinic still tire, BP setting 124/78 pulse 111, standing 108/72 pulse 115. [name] called Dr. Thurman Coyer office again, was told that Dr. Wynonia Lawman wants Dr. Mariea Clonts to call him [phone]. On 02/21/15 we increase her Nu Iron  To twice daily. Message to Dr. Mariea Clonts.

## 2015-02-23 NOTE — Telephone Encounter (Signed)
[name] [name] just got a call from patient BP 173/108 pulse 112, patient wants to go to hospital. [name] [name] on her wait to do an assessment at her home, will need an order from Dr. Mariea Clonts to sent patient to hospital.  Text Dr. Mariea Clonts, she said ok. Called [name] back, gave her Dr. Cyndi Lennert answer.

## 2015-02-23 NOTE — ED Notes (Signed)
Bed: WA04 Expected date:  Expected time:  Means of arrival:  Comments: Hold for A

## 2015-02-23 NOTE — Telephone Encounter (Signed)
Spoke with Dr. Wynonia Lawman.  He says that he will gladly see Ms. Kuck more often if we are having difficulty managing her blood pressure and pulse.  We discussed her anxiety.  He suggested an EKG at present to further evaluate her tachycardia.

## 2015-02-23 NOTE — ED Notes (Signed)
Bed: [facility] Expected date:  Expected time:  Means of arrival:  Comments: EMS- 81yo F, weakness, recent PNA

## 2015-02-23 NOTE — ED Notes (Signed)
MD at bedside. 

## 2015-02-23 NOTE — Telephone Encounter (Signed)
EMT came to house patient has fever again, told to take patient to hospital, per Dr. Cyndi Lennert note

## 2015-02-23 NOTE — ED Notes (Signed)
Pt was able to contact staff member at [facility] who will have primary MD see patient tomorrow and adjust HTN medications. Advised patient to check BP with home monitor tonight and to return if BP higher than current measurement. Ambulatory with steady gait and 1 assist (usually walks with a walker). No other c/c. AVS explained in detail.

## 2015-02-26 ENCOUNTER — Encounter: Payer: Self-pay | Admitting: Adult Health

## 2015-02-26 ENCOUNTER — Non-Acute Institutional Stay (SKILLED_NURSING_FACILITY): Payer: Medicare Other | Admitting: Adult Health

## 2015-02-26 DIAGNOSIS — D638 Anemia in other chronic diseases classified elsewhere: Secondary | ICD-10-CM | POA: Diagnosis not present

## 2015-02-26 DIAGNOSIS — I480 Paroxysmal atrial fibrillation: Secondary | ICD-10-CM | POA: Diagnosis not present

## 2015-02-26 DIAGNOSIS — I10 Essential (primary) hypertension: Secondary | ICD-10-CM | POA: Diagnosis not present

## 2015-02-26 DIAGNOSIS — I712 Thoracic aortic aneurysm, without rupture: Secondary | ICD-10-CM

## 2015-02-26 DIAGNOSIS — R509 Fever, unspecified: Secondary | ICD-10-CM | POA: Diagnosis not present

## 2015-02-26 DIAGNOSIS — J470 Bronchiectasis with acute lower respiratory infection: Secondary | ICD-10-CM | POA: Diagnosis not present

## 2015-02-26 DIAGNOSIS — K5901 Slow transit constipation: Secondary | ICD-10-CM

## 2015-02-26 DIAGNOSIS — S32000D Wedge compression fracture of unspecified lumbar vertebra, subsequent encounter for fracture with routine healing: Secondary | ICD-10-CM

## 2015-02-26 DIAGNOSIS — I7121 Aneurysm of the ascending aorta, without rupture: Secondary | ICD-10-CM

## 2015-02-26 NOTE — Progress Notes (Addendum)
Patient ID: [name] [name], female   DOB: Oct 19, 1930, 81 y.o.   MRN: [id number]   Location:  [facility]  PCP: Hollace Kinnier, DO  Code Status: full code Goals of Care: Advanced Directive information   Patient Care Team: Gayland Curry, DO as PCP - General (Geriatric Medicine) [facility] Paralee Cancel, MD as Consulting Physician (Orthopedic Surgery) Jacolyn Reedy, MD as Consulting Physician (Cardiology) Royal Hawthorn, NP as Nurse Practitioner (Nurse Practitioner) Tanda Rockers, MD as Consulting Physician (Pulmonary Disease)   Chief Complaint  Patient presents with  . Acute Visit    febrile illness    HPI: 81 y.o. female with h/o afib, bronchiectasis, COPD, lumbar spinal compression fracture and AAA was seen today for admission to the rehab unit. This is her third admission due to a febrile illness and the 4th time she has been on antibiotics for this issue. Her main symptoms are fever, sweats, and malaise. She developed a temp of 102.2 on 02/23/15 and went to the ER with negative urine, and stable CXR findings. She reports blood cultures were drawn but the hospital indicated they were not. She was started on Cipro on 02/24/15 per Dr. Melvyn Novas recommendation in his notes by [facility]. She does not currently have a cough, sputum, runny nose, diarrhea, vomiting, SOB, or CP or joint pain.    History of febrile illness  She originally was seen by her primary care doc in mid May and started on Doxycycline for a probable bronchitis and at that point she did have purulent sputum and a cough, but no xray was obtained. On 12/24/14 she was started on Levaquin for 10 days for a fever but minimal cough. She did have positive xray findings in the RUL with a infiltrate that was modest. Then, she was started on Levaquin on 6/13 and then changed to Cipro on 6/15 for pseudomonas coverage for a probable pneumonia with xray findings to the left upper lobe. She completed10 days.  Then she was  started on cipro on 7/4 for fever with no cough or sputum. She completed 10 days and then received a bronchoscopy by Dr. Melvyn Novas which was unrevealing.  Her initial AFB was neg but final report is still pending.   Of note she denies any recent injections or dental procedures.  She has had both knees and both hips replaced but denies pain to these areas. She denies any sexual activity for 33 years. She did have a sputum culture (not a good specimen) and blood cultures performed in June which showed no growth.    Lumbar fx She suffered a lumbar fracture of L3 due to a mechanical fall, noted on xray on 5/23. Marland Kitchen She is ambulatory with a walker and has no radiculopathy. She reports that this has healed and has little pain to her low back.    Anemia Her H/H on 01/20/15 was 8.6/27 normocytic, normochromic. Recheck on 6/23 shows an H/H of 9.2/28.8. She has been hemoccult negative x2. She remains on eliquis for afib. She is on 2.5 mg BID due to a pharmacy recommendation due to an interaction with cardizem, her age, and renal function.    ROS: Review of Systems  Constitutional: Positive for fever, chills, malaise/fatigue and diaphoresis.  HENT: Negative for congestion and sore throat.   Eyes: Negative for blurred vision, double vision, pain and redness.  Respiratory: Negative for cough, sputum production, shortness of breath and wheezing.   Cardiovascular: Negative for chest pain and leg swelling.  Gastrointestinal: Negative for nausea, vomiting, abdominal pain, diarrhea, constipation, blood in stool and melena.  Genitourinary: Negative for dysuria and urgency.       Leakage at night  Musculoskeletal: Negative for back pain and falls.  Skin: Negative for rash.  Neurological: Positive for weakness. Negative for dizziness and focal weakness.  Psychiatric/Behavioral: Negative for memory loss. The patient is nervous/anxious and has insomnia.     Past Medical History  Diagnosis Date  . COPD (chronic  obstructive pulmonary disease)     pft's 09/15/2005, FEV1 1.69 (76%) with ration67 and better x 14% p B2, DLC 95%  . Hypertension   . Bronchitis, chronic   . Celiac disease   . Anxiety   . Pneumonia     hx of x3  . Acute blood loss anemia 09/07/13  . Anxiety state, unspecified   . Bronchiectasis 2012  . Cerebrovascular disease, unspecified 05/26/13  . Cerebral atrophy 05/26/13  . Anemia   . Atrial fibrillation   . Aneurysm of aorta    Past Surgical History  Procedure Laterality Date  . Nasal sinus surgery  years ago  . Total knee arthroplasty Bilateral [id number]  . Excision morton's neuroma Bilateral 35 years ago, 2010  . Total hip arthroplasty Right 2009    Dr. Telford Nab  . Cataract extraction Bilateral ~8 years ago  . Total hip arthroplasty Left 09/06/2013    Dr. Salli Quarry  . Video bronchoscopy Bilateral 02/20/2015    Procedure: VIDEO BRONCHOSCOPY WITHOUT FLUORO;  Surgeon: Tanda Rockers, MD;  Location: [facility];  Service: Cardiopulmonary;  Laterality: Bilateral;   Social History:   reports that she quit smoking about 3 years ago. Her smoking use included Cigarettes. She has a 30 pack-year smoking history. She has never used smokeless tobacco. She reports that she drinks alcohol. She reports that she does not use illicit drugs.  Family History  Problem Relation Age of Onset  . Heart disease Father 32    heart attack  . Prostate cancer Paternal Grandfather   . Pulmonary fibrosis Brother     former smoker    Allergies  Allergen Reactions  . Benicar [Olmesartan] Diarrhea and Other (See Comments)    Hypotension  . Metronidazole Nausea Only  . Morphine Hives    Medications: Patient's Medications  New Prescriptions   No medications on file  Previous Medications   ACETAMINOPHEN (TYLENOL) 650 MG CR TABLET    Take 1,300 mg by mouth every morning.    ALBUTEROL (PROVENTIL HFA;VENTOLIN HFA) 108 (90 BASE) MCG/ACT INHALER    Inhale 1-2 puffs into the lungs every 6 (six) hours as  needed for wheezing or shortness of breath.   ALPRAZOLAM (XANAX) 0.5 MG TABLET    Take 1 tablet (0.5 mg total) by mouth every 6 (six) hours as needed for anxiety or sleep.   BISACODYL PO    Take by mouth at bedtime.   BUDESONIDE-FORMOTEROL (SYMBICORT) 160-4.5 MCG/ACT INHALER    Inhale 2 puffs into the lungs 2 (two) times daily.     CIPROFLOXACIN (CIPRO) 500 MG TABLET    Take 500 mg by mouth 2 (two) times daily.   DILTIAZEM (CARDIZEM CD) 120 MG 24 HR CAPSULE    Take 120 mg by mouth every morning.    DIPHENHYDRAMINE-ACETAMINOPHEN (TYLENOL PM) 25-500 MG TABS    Take 2 tablets by mouth at bedtime.   DOFETILIDE (TIKOSYN) 250 MCG CAPSULE    Take 1 capsule (250 mcg total) by mouth 2 (two) times daily.  ELIQUIS 5 MG TABS TABLET    Take 2.5 mg by mouth 2 (two) times daily.   GLUCOSAMINE-CHONDROITIN 500-400 MG TABLET    Take 1 tablet by mouth 2 (two) times daily.     IRON POLYSACCHARIDES (NIFEREX) 150 MG CAPSULE    Take 150 mg by mouth. Take one tablet daily for iron   LOSARTAN (COZAAR) 100 MG TABLET    Take 50 mg by mouth every evening.    METOPROLOL SUCCINATE (TOPROL-XL) 50 MG 24 HR TABLET    Take 50 mg by mouth daily. Take with or immediately following a meal.   MULTIPLE VITAMINS-MINERALS (MULTIVITAMIN ADULT) TABS    Take by mouth. Take one tablet daily   POLYETHYL GLYCOL-PROPYL GLYCOL (SYSTANE OP)    Place 1 drop into both eyes every morning.   POLYETHYLENE GLYCOL (MIRALAX / GLYCOLAX) PACKET    Take 17 g by mouth daily.   SACCHAROMYCES BOULARDII (FLORASTOR PO)    Take by mouth. Probiotic take one twice daily   SPIRONOLACTONE (ALDACTONE) 25 MG TABLET    Take 12.5 mg by mouth daily.   Modified Medications   No medications on file  Discontinued Medications   No medications on file     Physical Exam: Filed Vitals:   02/26/15 1521  BP: 105/70  Pulse: 85  Temp: 98.1 F (36.7 C)  Resp: 21  SpO2: 95%   There is no weight on file to calculate BMI. Physical Exam  Constitutional: She is oriented  to person, place, and time. She appears well-developed and well-nourished.  HENT:  Head: Normocephalic and atraumatic.  Right Ear: Tympanic membrane, external ear and ear canal normal.  Left Ear: Tympanic membrane, external ear and ear canal normal.  Nose: Nose normal. No mucosal edema or rhinorrhea. Right sinus exhibits no maxillary sinus tenderness and no frontal sinus tenderness. Left sinus exhibits no maxillary sinus tenderness and no frontal sinus tenderness.  Mouth/Throat: Oropharynx is clear and moist. No oropharyngeal exudate.  Eyes: Conjunctivae and EOM are normal. Pupils are equal, round, and reactive to light.  Neck: Neck supple. No JVD present. No tracheal deviation present. No thyromegaly present.  Cardiovascular: Normal rate, regular rhythm, normal heart sounds and intact distal pulses.   Pulmonary/Chest: Effort normal and breath sounds normal. She has no wheezes. She has no rales.  Abdominal: Soft. Bowel sounds are normal. She exhibits no distension and no mass. There is no tenderness.  Musculoskeletal: Normal range of motion.  Lymphadenopathy:    She has no cervical adenopathy.  Neurological: She is alert and oriented to person, place, and time.  Skin: Skin is warm and dry.  Psychiatric: She has a normal mood and affect. Her behavior is normal. Judgment and thought content normal.     Labs reviewed: Basic Metabolic Panel:  Recent Labs  05/08/14 0044  05/09/14 0349 05/10/14 0307  07/19/14 0400 12/24/14 1210  01/17/15 01/25/15 02/23/15 1843  NA 138  < > 141 139  < > 136* 132*  < > 125* 138 132*  K 4.0  < > 4.0 4.2  < > 3.8 4.7  < > 3.8 5.1 3.9  CL 97  < > 101 98  < > 97 94*  --   --   --  97*  CO2 27  < > 28 27  < > 27 27  --   --   --  26  GLUCOSE 105*  < > 107* 100*  < > 111* 122*  --   --   --  103*  BUN 17  < > 16 20  < > 11 19  < > 13 19 17   CREATININE 0.52  < > 0.55 0.69  < > 0.47* 0.77  < > 0.6 0.8 0.65  CALCIUM 9.2  < > 9.4 9.1  < > 8.9 9.0  --   --   --   8.9  MG 2.1  --  2.2 2.2  --   --   --   --   --   --   --   PHOS  --   --   --   --   --  3.0  --   --   --   --   --   < > = values in this interval not displayed. Liver Function Tests:  Recent Labs  07/18/14 2357 07/19/14 0400 12/24/14 1210  AST 19 19 25   ALT 11 11 16   ALKPHOS 54 57 50  BILITOT 0.4 0.5 0.6  PROT 6.8 6.7 7.4  ALBUMIN 3.4* 3.3* 3.3*   No results for input(s): LIPASE, AMYLASE in the last 8760 hours. No results for input(s): AMMONIA in the last 8760 hours. CBC:  Recent Labs  07/18/14 2357 07/19/14 0400 12/24/14 1210  01/25/15 02/01/15 02/23/15 1843  WBC 9.5 8.4 9.8  < > 6.4 6.4 7.0  NEUTROABS 7.4 6.4 8.3*  --   --   --   --   HGB 11.0* 10.6* 10.2*  < > 9.2* 9.8* 9.5*  HCT 34.6* 33.7* 33.5*  < > 29* 32* 31.0*  MCV 98.0 98.5 95.7  --   --   --  88.8  PLT 210 200 264  < > 482* 408* 283  < > = values in this interval not displayed. Cardiac Enzymes:  Recent Labs  07/19/14 0400 07/19/14 0946 07/19/14 1409  TROPONINI <0.30 <0.30 <0.30   Lab Results  Component Value Date   TSH 2.180 07/19/2014   Imaging and Procedures obtained prior to SN[facility] admission: 01/10/15 CXR (ordered by Dr. Melvyn Novas):   1. Clearing of patchy opacity within the right upper lobe on prior chest x-ray. No definite active process. 2. Stable scarring in the right middle lobe and lingula.  6/16/16CXR  IMPRESSION: New airspace opacities of developed at the left apex worrisome for pneumonia. Followup PA and lateral chest X-ray is recommended in 3-4 weeks following trial of antibiotic therapy to ensure resolution and exclude underlying malignancy.  02/23/15 CXR IMPRESSION: Stable patchy bilateral lower lobe airspace opacities. Review of prior exams dating back to April demonstrates chronicity of these findings, which may occur with atypical infectious organisms or Scarring.  Assessment/Plan  1. Febrile illness -?etiology, unfortunately no blood cultures were obtained, she may need  more extensive work up to include echo, CT scan, etc. The working dx in the past has been pseudomonas pna vs MAI (final result pending) -Continue cipro to complete 10 days with Florastor -F/U with ID ASAP -if fever continues through tomorrow may need to change antibiotic regimen -F/U CBC BMP on Thursday 7/28  2. Paroxysmal atrial fibrillation -rate controlled with Cardizem, Tikosyn, and Toprol.  Currently on eliquis for CVA risk reduction, 2.5 mg BID due to med interaction with Cardizem per pharm recommendation  3. Essential hypertension -BP soft now at 105/70  -has previously had issues with hypotension/hyponatremia and feeling weak during her bouts of febrile illness -reduce aldactone to 12.5 mg daily  4. Ascending aortic aneurysm -monitored by Dr. Wynonia Lawman  5. Bronchiectasis  -no sputum  production or cough currently -has COPD and maintained on Symbicort  6. Lumbar compression fracture, with routine healing, subsequent encounter -improved pain  7. Slow transit constipation -stable on miralax   8. Anemia, chronic disease -stable hgb  -most likely due to febrile illness -continue Niferex   Cindi Carbon, ANP [facility] [phone]

## 2015-02-26 NOTE — Progress Notes (Signed)
Quick Note:  ATC, NA and no option to leave msg, WCB ______ 

## 2015-02-27 ENCOUNTER — Non-Acute Institutional Stay (SKILLED_NURSING_FACILITY): Payer: Medicare Other | Admitting: Internal Medicine

## 2015-02-27 DIAGNOSIS — I10 Essential (primary) hypertension: Secondary | ICD-10-CM

## 2015-02-27 DIAGNOSIS — S32000D Wedge compression fracture of unspecified lumbar vertebra, subsequent encounter for fracture with routine healing: Secondary | ICD-10-CM | POA: Diagnosis not present

## 2015-02-27 DIAGNOSIS — F411 Generalized anxiety disorder: Secondary | ICD-10-CM | POA: Diagnosis not present

## 2015-02-27 DIAGNOSIS — R509 Fever, unspecified: Secondary | ICD-10-CM | POA: Diagnosis not present

## 2015-02-27 DIAGNOSIS — K5901 Slow transit constipation: Secondary | ICD-10-CM | POA: Diagnosis not present

## 2015-02-27 DIAGNOSIS — I480 Paroxysmal atrial fibrillation: Secondary | ICD-10-CM

## 2015-02-27 DIAGNOSIS — D509 Iron deficiency anemia, unspecified: Secondary | ICD-10-CM

## 2015-02-27 NOTE — Progress Notes (Signed)
Patient ID: [name] [name], female   DOB: Dec 18, 1930, 81 y.o.   MRN: [id number]  Provider:  Rexene Edison. Mariea Clonts, D.O., C.M.D. Location:  [facility]  PCP: Hollace Kinnier, DO  Code Status: full code Goals of Care: Advanced Directive information Does patient have an advance directive?: Yes, Type of Advance Directive: [place], Does patient want to make changes to advanced directive?: No - Patient declined  Chief Complaint  Patient presents with  . New Admit To SNF    again admitted to SNF after sent out for fever, tachycardia    HPI: 81 y.o. female white female [facility] resident was again admitted to rehab s/p ED visit with fever, sinus tachycardia on EKG.  UA, cbc, bmp were done and revealed only anemia with h/h 9.5/31 for which she was started on nu-iron and slight hyponatremia at 132 which she seems to get with each of these recurrent episodes.  She's had oscillating sweats and chills once again with this illness.  Her daughter, [name] [name], tells me that blood cultures were obtained in the ED, but I am unable to find any.  She was then started on cipro for the third round.  She previously was on levaquin, as well.  She underwent bronchoscopy to assess for MAI by Dr. Melvyn Novas and cultures and cytology have all been unremarkable at this point.  One final culture for mycobacterium is still pending.  Despite the cipro, she has continued with fevers--low grade at 99 this am; last night at 8pm 100.7 with HR 107. We have put in a referral for infectious disease to further work up these fevers and tachycardia.  She has no new murmurs on exam and no signs of chf.  She remains weak and anxious.  PPD has been negative on each admission here to rehab.  ROS: Review of Systems  Constitutional: Positive for fever, chills, malaise/fatigue and diaphoresis.  HENT: Negative for hearing loss.   Eyes: Negative for blurred vision.  Respiratory: Negative for cough, sputum production,  shortness of breath and wheezing.   Cardiovascular: Negative for chest pain, palpitations, orthopnea, leg swelling and PND.  Gastrointestinal: Negative for abdominal pain, constipation, blood in stool and melena.  Genitourinary: Negative for dysuria, urgency and frequency.  Musculoskeletal: Negative for falls.       Not c/o her back pain now  Skin: Negative for itching and rash.  Neurological: Positive for weakness. Negative for dizziness, loss of consciousness and headaches.  Endo/Heme/Allergies: Does not bruise/bleed easily.  Psychiatric/Behavioral: Negative for memory loss. The patient is nervous/anxious and has insomnia.     Past Medical History  Diagnosis Date  . COPD (chronic obstructive pulmonary disease)     pft's 09/15/2005, FEV1 1.69 (76%) with ration67 and better x 14% p B2, DLC 95%  . Hypertension   . Bronchitis, chronic   . Celiac disease   . Anxiety   . Pneumonia     hx of x3  . Acute blood loss anemia 09/07/13  . Anxiety state, unspecified   . Bronchiectasis 2012  . Cerebrovascular disease, unspecified 05/26/13  . Cerebral atrophy 05/26/13  . Anemia   . Atrial fibrillation   . Aneurysm of aorta    Past Surgical History  Procedure Laterality Date  . Nasal sinus surgery  years ago  . Total knee arthroplasty Bilateral [id number]  . Excision morton's neuroma Bilateral 35 years ago, 2010  . Total hip arthroplasty Right 2009    Dr. Telford Nab  . Cataract extraction  Bilateral ~8 years ago  . Total hip arthroplasty Left 09/06/2013    Dr. Salli Quarry  . Video bronchoscopy Bilateral 02/20/2015    Procedure: VIDEO BRONCHOSCOPY WITHOUT FLUORO;  Surgeon: Tanda Rockers, MD;  Location: [facility];  Service: Cardiopulmonary;  Laterality: Bilateral;   Social History:   reports that she quit smoking about 3 years ago. Her smoking use included Cigarettes. She has a 30 pack-year smoking history. She has never used smokeless tobacco. She reports that she drinks alcohol. She reports that  she does not use illicit drugs.  Family History  Problem Relation Age of Onset  . Heart disease Father 30    heart attack  . Prostate cancer Paternal Grandfather   . Pulmonary fibrosis Brother     former smoker    Allergies  Allergen Reactions  . Benicar [Olmesartan] Diarrhea and Other (See Comments)    Hypotension  . Metronidazole Nausea Only  . Morphine Hives    Medications: Patient's Medications  New Prescriptions   No medications on file  Previous Medications   ACETAMINOPHEN (TYLENOL) 650 MG CR TABLET    Take 1,300 mg by mouth every morning.    ALBUTEROL (PROVENTIL HFA;VENTOLIN HFA) 108 (90 BASE) MCG/ACT INHALER    Inhale 1-2 puffs into the lungs every 6 (six) hours as needed for wheezing or shortness of breath.   ALPRAZOLAM (XANAX) 0.5 MG TABLET    Take 1 tablet (0.5 mg total) by mouth every 6 (six) hours as needed for anxiety or sleep.   BISACODYL PO    Take by mouth at bedtime.   BUDESONIDE-FORMOTEROL (SYMBICORT) 160-4.5 MCG/ACT INHALER    Inhale 2 puffs into the lungs 2 (two) times daily.     CIPROFLOXACIN (CIPRO) 500 MG TABLET    Take 500 mg by mouth 2 (two) times daily.   DILTIAZEM (CARDIZEM CD) 120 MG 24 HR CAPSULE    Take 120 mg by mouth every morning.    DIPHENHYDRAMINE-ACETAMINOPHEN (TYLENOL PM) 25-500 MG TABS    Take 2 tablets by mouth at bedtime.   DOFETILIDE (TIKOSYN) 250 MCG CAPSULE    Take 1 capsule (250 mcg total) by mouth 2 (two) times daily.   ELIQUIS 5 MG TABS TABLET    Take 2.5 mg by mouth 2 (two) times daily.   GLUCOSAMINE-CHONDROITIN 500-400 MG TABLET    Take 1 tablet by mouth 2 (two) times daily.     IRON POLYSACCHARIDES (NIFEREX) 150 MG CAPSULE    Take 150 mg by mouth. Take one tablet daily for iron   LOSARTAN (COZAAR) 100 MG TABLET    Take 50 mg by mouth every evening.    METOPROLOL SUCCINATE (TOPROL-XL) 50 MG 24 HR TABLET    Take 50 mg by mouth daily. Take with or immediately following a meal.   MULTIPLE VITAMINS-MINERALS (MULTIVITAMIN ADULT) TABS     Take by mouth. Take one tablet daily   POLYETHYL GLYCOL-PROPYL GLYCOL (SYSTANE OP)    Place 1 drop into both eyes every morning.   POLYETHYLENE GLYCOL (MIRALAX / GLYCOLAX) PACKET    Take 17 g by mouth daily.   SACCHAROMYCES BOULARDII (FLORASTOR PO)    Take by mouth. Probiotic take one twice daily   SPIRONOLACTONE (ALDACTONE) 25 MG TABLET    Take 12.5 mg by mouth daily.   Modified Medications   No medications on file  Discontinued Medications   No medications on file     Physical Exam: Filed Vitals:   02/27/15 0931  BP: 118/78  Pulse: 107  Temp: 100.7 F (38.2 C)  Resp: 20  Height: 5' 6"  (1.676 m)  Weight: 173 lb 9.6 oz (78.744 kg)  SpO2: 95%   Body mass index is 28.03 kg/(m^2). Physical Exam  Constitutional: She is oriented to person, place, and time. She appears well-developed and well-nourished. No distress.  HENT:  Head: Normocephalic and atraumatic.  Right Ear: External ear normal.  Left Ear: External ear normal.  Nose: Nose normal.  Mouth/Throat: Oropharynx is clear and moist.  Eyes: Conjunctivae and EOM are normal. Pupils are equal, round, and reactive to light.  Neck: Normal range of motion. Neck supple. No JVD present.  Cardiovascular: Normal rate, regular rhythm, normal heart sounds and intact distal pulses.   Pulmonary/Chest: Effort normal and breath sounds normal. No respiratory distress.  Abdominal: Soft. Bowel sounds are normal. She exhibits no distension. There is no tenderness.  Musculoskeletal: Normal range of motion.  Weak when tries to sit up  Lymphadenopathy:    She has no cervical adenopathy.  Neurological: She is alert and oriented to person, place, and time.  Skin: Skin is warm and dry.  Psychiatric:  anxious     Labs reviewed: Basic Metabolic Panel:  Recent Labs  05/08/14 0044  05/09/14 0349 05/10/14 0307  07/19/14 0400 12/24/14 1210  01/17/15 01/25/15 02/23/15 1843  NA 138  < > 141 139  < > 136* 132*  < > 125* 138 132*  K 4.0  < >  4.0 4.2  < > 3.8 4.7  < > 3.8 5.1 3.9  CL 97  < > 101 98  < > 97 94*  --   --   --  97*  CO2 27  < > 28 27  < > 27 27  --   --   --  26  GLUCOSE 105*  < > 107* 100*  < > 111* 122*  --   --   --  103*  BUN 17  < > 16 20  < > 11 19  < > 13 19 17   CREATININE 0.52  < > 0.55 0.69  < > 0.47* 0.77  < > 0.6 0.8 0.65  CALCIUM 9.2  < > 9.4 9.1  < > 8.9 9.0  --   --   --  8.9  MG 2.1  --  2.2 2.2  --   --   --   --   --   --   --   PHOS  --   --   --   --   --  3.0  --   --   --   --   --   < > = values in this interval not displayed. Liver Function Tests:  Recent Labs  07/18/14 2357 07/19/14 0400 12/24/14 1210  AST 19 19 25   ALT 11 11 16   ALKPHOS 54 57 50  BILITOT 0.4 0.5 0.6  PROT 6.8 6.7 7.4  ALBUMIN 3.4* 3.3* 3.3*   No results for input(s): LIPASE, AMYLASE in the last 8760 hours. No results for input(s): AMMONIA in the last 8760 hours. CBC:  Recent Labs  07/18/14 2357 07/19/14 0400 12/24/14 1210  01/25/15 02/01/15 02/23/15 1843  WBC 9.5 8.4 9.8  < > 6.4 6.4 7.0  NEUTROABS 7.4 6.4 8.3*  --   --   --   --   HGB 11.0* 10.6* 10.2*  < > 9.2* 9.8* 9.5*  HCT 34.6* 33.7* 33.5*  < > 29* 32* 31.0*  MCV 98.0 98.5 95.7  --   --   --  88.8  PLT 210 200 264  < > 482* 408* 283  < > = values in this interval not displayed. Cardiac Enzymes:  Recent Labs  07/19/14 0400 07/19/14 0946 07/19/14 1409  TROPONINI <0.30 <0.30 <0.30   Lab Results  Component Value Date   TSH 2.180 07/19/2014   Imaging and Procedures obtained prior to SNF admission were reviewed: Bronchoscopy 02/20/15, Dr. Melvyn Novas  Patient Care Team: Gayland Curry, DO as PCP - General (Geriatric Medicine) [facility] Paralee Cancel, MD as Consulting Physician (Orthopedic Surgery) Jacolyn Reedy, MD as Consulting Physician (Cardiology) Royal Hawthorn, NP as Nurse Practitioner (Nurse Practitioner) Tanda Rockers, MD as Consulting Physician (Pulmonary Disease)  Assessment/Plan 1. Febrile  illness -ongoing investigation -has had 3 recurrences -has been treated first with levaquin and switched to cipro and twice now with cipro thereafter and continues with oscillating fever/tachycardia (not always afib) -continues on cipro at present that was restarted 02/23/15 -will do some more labs for FUO:  ESR, HIV Ab assay, RF, ANA, SPEP, peripheral smear -pt's daughter wants another abx added if her ID appt is not for a long time -also has been using tylenol each am and still having fevers at times  2. Paroxysmal atrial fibrillation -cont cardizem, tikosyn, eliquis, toprol -episode when she went to ED--once was afib, once was sinus tach when rhythm strips were run -the tachycardia episodes seems correlated with her fever  3. Essential hypertension -bp well controlled today, cont to monitor  4. Lumbar compression fracture, with routine healing, subsequent encounter -denies pain complaints today, has been using tylenol daily  5. Slow transit constipation -ongoing; is now on iron, too -cont bisacodyl and miralax when needed  6. Anemia, iron deficiency -cont nu-iron  7. Anxiety state - is worried about what's going on with her -cont xanax 0.79m po q 6 hrs for anxiety   Functional status:  Is requiring some assistance with her adls due to weakness, but is able to use restroom on her own and groom herself  Family/ staff Communication: discused with rehab nurse and spoke with Javaeh's daughter, PGerri Spore by phone  Labs/tests ordered:  Will order labs above  Anand Tejada L. Lurleen Soltero, D.O. [facility] [address] [place] [id number] Phone (Mon-Fri 8am-5pm):  [phone] Call:  [phone]& follow prompts after 5pm & weekends Office Phone:  [phone] Fax:  [phone]

## 2015-03-04 ENCOUNTER — Encounter: Payer: Self-pay | Admitting: Internal Medicine

## 2015-03-05 ENCOUNTER — Telehealth: Payer: Self-pay | Admitting: Internal Medicine

## 2015-03-05 NOTE — Telephone Encounter (Signed)
Result Note     Call patient : Studies are unremarkable, no change in recs (no evidence of any true infection at time of bronch)   lmomtcb x1

## 2015-03-06 ENCOUNTER — Non-Acute Institutional Stay (SKILLED_NURSING_FACILITY): Payer: Medicare Other | Admitting: Internal Medicine

## 2015-03-06 DIAGNOSIS — I480 Paroxysmal atrial fibrillation: Secondary | ICD-10-CM

## 2015-03-06 DIAGNOSIS — I10 Essential (primary) hypertension: Secondary | ICD-10-CM

## 2015-03-06 DIAGNOSIS — D509 Iron deficiency anemia, unspecified: Secondary | ICD-10-CM | POA: Diagnosis not present

## 2015-03-06 DIAGNOSIS — R509 Fever, unspecified: Secondary | ICD-10-CM | POA: Diagnosis not present

## 2015-03-06 DIAGNOSIS — D638 Anemia in other chronic diseases classified elsewhere: Secondary | ICD-10-CM

## 2015-03-06 LAB — BASIC METABOLIC PANEL
BUN: 22 mg/dL — AB (ref 4–21)
CREATININE: 0.8 mg/dL (ref 0.5–1.1)
Glucose: 101 mg/dL
POTASSIUM: 4.9 mmol/L (ref 3.4–5.3)
Sodium: 137 mmol/L (ref 137–147)

## 2015-03-06 NOTE — Telephone Encounter (Signed)
Spoke with pt and notified of results per Dr. Wert. Pt verbalized understanding and denied any questions. 

## 2015-03-06 NOTE — Telephone Encounter (Signed)
Pt returned call [phone]

## 2015-03-06 NOTE — Progress Notes (Signed)
Patient ID: [name] [name], female   DOB: [birth year], 81 y.o.   MRN: [id number]  Location:  [facility] Provider:  Rexene Edison. Mariea Clonts, D.O., C.M.D.  Code Status:  DNR Goals of care: Advanced Directive information Does patient have an advance directive?: Yes, Type of Advance Directive: [place], Does patient want to make changes to advanced directive?: No - Patient declined  Chief Complaint  Patient presents with  . Discharge Note    to assisted living until strong enough to return to independent living    HPI:  81 yo white female with h/o bronchiectasis, COPD, anemia, afib has been here in rehab for her third treatment for a febrile illness of unknown etiology.  She has been on cipro 7/23-8/1 569m po bid.  She is to complete this wed am.  She recently has been afebrile.  Temp today 97.2.  She has not had the episodes of sweating since her 3rd day of cipro.  Last low grade temp on [facility] record was July 27th.  She is doing a fever log twice daily for 14 days and bp and HR also.  She sees Dr. CMegan Salon8/10/16.  She is going to stay in [place] for a few days to make sure she is ok to go back to independent living.  She is very anxious and disturbed that her illness has not yet been identified.  Thus far all bronch results were normal.  She is also worrying about her blood pressure--this seems to go up in context of her fevers and anxiety.  She also is concerned about her anemia and is taking iron and wants those panels all repeated already.  Review of Systems:  Review of Systems  Constitutional: Positive for weight loss and malaise/fatigue. Negative for fever and chills.       All improved, last low grade fever was 7/27, last true fever July 25 of 101.3  HENT: Negative for congestion and hearing loss.   Eyes: Negative for blurred vision.  Respiratory: Negative for cough and shortness of breath.   Cardiovascular: Negative for chest pain and leg swelling.  Gastrointestinal:  Negative for abdominal pain, diarrhea, constipation, blood in stool and melena.  Genitourinary: Negative for dysuria, urgency and frequency.  Musculoskeletal: Negative for falls.  Skin: Negative for itching and rash.  Neurological: Positive for weakness. Negative for dizziness and loss of consciousness.  Psychiatric/Behavioral: Negative for memory loss. The patient is nervous/anxious.     Past Medical History  Diagnosis Date  . COPD (chronic obstructive pulmonary disease)     pft's 09/15/2005, FEV1 1.69 (76%) with ration67 and better x 14% p B2, DLC 95%  . Hypertension   . Bronchitis, chronic   . Celiac disease   . Anxiety   . Pneumonia     hx of x3  . Acute blood loss anemia 09/07/13  . Anxiety state, unspecified   . Bronchiectasis 2012  . Cerebrovascular disease, unspecified 05/26/13  . Cerebral atrophy 05/26/13  . Anemia   . Atrial fibrillation   . Aneurysm of aorta     Patient Active Problem List   Diagnosis Date Noted  . Degenerative joint disease 03/14/2015  . Unintentional weight loss 03/14/2015  . Fever of unknown origin (FUO) 02/26/2015  . Anemia, chronic disease 02/26/2015  . Bronchiectasis   . Constipation 01/26/2015  . Lumbar compression fracture 01/08/2015  . Dizziness 07/19/2014  . Current use of long term anticoagulation 05/10/2014  . Paroxysmal atrial fibrillation 05/07/2014  .  CAD (coronary artery disease), native coronary artery   . Ascending aortic aneurysm   . Overweight (BMI 25.0-29.9) 09/07/2013  . COPD GOLD II 07/09/2007  . Essential hypertension     Allergies  Allergen Reactions  . Benicar [Olmesartan] Diarrhea and Other (See Comments)    Hypotension  . Metronidazole Nausea Only  . Morphine Hives    Medications: Patient's Medications  New Prescriptions   No medications on file  Previous Medications   ACETAMINOPHEN (TYLENOL) 650 MG CR TABLET    Take 1,300 mg by mouth every morning.    ALBUTEROL (PROVENTIL HFA;VENTOLIN HFA) 108 (90 BASE)  MCG/ACT INHALER    Inhale 1-2 puffs into the lungs every 6 (six) hours as needed for wheezing or shortness of breath.   ALPRAZOLAM (XANAX) 0.5 MG TABLET    Take 1 tablet (0.5 mg total) by mouth every 6 (six) hours as needed for anxiety or sleep.   BISACODYL PO    Take by mouth at bedtime.   DILTIAZEM (CARDIZEM CD) 120 MG 24 HR CAPSULE    Take 120 mg by mouth every morning.    DIPHENHYDRAMINE-ACETAMINOPHEN (TYLENOL PM) 25-500 MG TABS    Take 2 tablets by mouth at bedtime.   DOFETILIDE (TIKOSYN) 250 MCG CAPSULE    Take 1 capsule (250 mcg total) by mouth 2 (two) times daily.   ELIQUIS 5 MG TABS TABLET    Take 2.5 mg by mouth 2 (two) times daily.   GLUCOSAMINE-CHONDROITIN 500-400 MG TABLET    Take 1 tablet by mouth 2 (two) times daily.     IRON POLYSACCHARIDES (NIFEREX) 150 MG CAPSULE    Take 150 mg by mouth. Take one tablet daily for iron   LOSARTAN (COZAAR) 100 MG TABLET    Take 50 mg by mouth every evening.    METOPROLOL SUCCINATE (TOPROL-XL) 50 MG 24 HR TABLET    Take 50 mg by mouth daily. Take with or immediately following a meal.   MULTIPLE VITAMINS-MINERALS (MULTIVITAMIN ADULT) TABS    Take by mouth. Take one tablet daily   POLYETHYL GLYCOL-PROPYL GLYCOL (SYSTANE OP)    Place 1 drop into both eyes every morning.   POLYETHYLENE GLYCOL (MIRALAX / GLYCOLAX) PACKET    Take 17 g by mouth daily.   SACCHAROMYCES BOULARDII (FLORASTOR PO)    Take by mouth. Probiotic take one twice daily   SPIRONOLACTONE (ALDACTONE) 25 MG TABLET    Take 12.5 mg by mouth daily.   Modified Medications   Modified Medication Previous Medication   CIPROFLOXACIN (CIPRO) 500 MG TABLET ciprofloxacin (CIPRO) 500 MG tablet      Take 1 tablet (500 mg total) by mouth 2 (two) times daily.    Take 500 mg by mouth 2 (two) times daily.   SYMBICORT 160-4.5 MCG/ACT INHALER budesonide-formoterol (SYMBICORT) 160-4.5 MCG/ACT inhaler      USE 2 PUFFS TWICE DAILY.    Inhale 2 puffs into the lungs 2 (two) times daily.    Discontinued  Medications   No medications on file    Physical Exam: Filed Vitals:   03/06/15 1031  BP: 123/80  Pulse: 88  Temp: 97.2 F (36.2 C)  Resp: 18  Height: 5' 6"  (1.676 m)  Weight: 167 lb 12.8 oz (76.114 kg)  SpO2: 96%   Body mass index is 27.1 kg/(m^2).  Physical Exam  Constitutional: She is oriented to person, place, and time. She appears well-developed and well-nourished. No distress.  Cardiovascular: Normal rate, regular rhythm, normal heart sounds and intact  distal pulses.   Pulmonary/Chest: Effort normal and breath sounds normal. No respiratory distress.  Abdominal: Soft. Bowel sounds are normal. She exhibits no distension. There is no tenderness.  Musculoskeletal: Normal range of motion.  Using rollator walker to steady herself due to fear of falling  Neurological: She is alert and oriented to person, place, and time.  Skin: Skin is warm and dry.  Psychiatric:  anxious    Labs reviewed: Basic Metabolic Panel:  Recent Labs  05/08/14 0044  05/09/14 0349 05/10/14 0307  07/19/14 0400 12/24/14 1210  01/25/15 02/23/15 1843 03/06/15  NA 138  < > 141 139  < > 136* 132*  < > 138 132* 137  K 4.0  < > 4.0 4.2  < > 3.8 4.7  < > 5.1 3.9 4.9  CL 97  < > 101 98  < > 97 94*  --   --  97*  --   CO2 27  < > 28 27  < > 27 27  --   --  26  --   GLUCOSE 105*  < > 107* 100*  < > 111* 122*  --   --  103*  --   BUN 17  < > 16 20  < > 11 19  < > 19 17 22*  CREATININE 0.52  < > 0.55 0.69  < > 0.47* 0.77  < > 0.8 0.65 0.8  CALCIUM 9.2  < > 9.4 9.1  < > 8.9 9.0  --   --  8.9  --   MG 2.1  --  2.2 2.2  --   --   --   --   --   --   --   PHOS  --   --   --   --   --  3.0  --   --   --   --   --   < > = values in this interval not displayed.  Liver Function Tests:  Recent Labs  07/18/14 2357 07/19/14 0400 12/24/14 1210  AST 19 19 25   ALT 11 11 16   ALKPHOS 54 57 50  BILITOT 0.4 0.5 0.6  PROT 6.8 6.7 7.4  ALBUMIN 3.4* 3.3* 3.3*    CBC:  Recent Labs  07/18/14 2357  07/19/14 0400 12/24/14 1210  01/25/15 02/01/15 02/23/15 1843  WBC 9.5 8.4 9.8  < > 6.4 6.4 7.0  NEUTROABS 7.4 6.4 8.3*  --   --   --   --   HGB 11.0* 10.6* 10.2*  < > 9.2* 9.8* 9.5*  HCT 34.6* 33.7* 33.5*  < > 29* 32* 31.0*  MCV 98.0 98.5 95.7  --   --   --  88.8  PLT 210 200 264  < > 482* 408* 283  < > = values in this interval not displayed.  Lab Results  Component Value Date   TSH 2.180 07/19/2014   No results found for: HGBA1C No results found for: CHOL, HDL, LDLCALC, LDLDIRECT, TRIG, CHOLHDL   Patient Care Team: Gayland Curry, DO as PCP - General (Geriatric Medicine) [facility] Paralee Cancel, MD as Consulting Physician (Orthopedic Surgery) Jacolyn Reedy, MD as Consulting Physician (Cardiology) Royal Hawthorn, NP as Nurse Practitioner (Nurse Practitioner) Tanda Rockers, MD as Consulting Physician (Pulmonary Disease) Michel Bickers, MD as Consulting Physician (Infectious Diseases)  Assessment/Plan 1. Febrile illness -oscillating temps -unclear etiology -initial bronch labs were all normal -other pertinent findings:  Associated tachycardia, hypertension  when febrile, worsening anemia, weakness, fatigue, some weight loss -does not have new murmur to suggest endocarditis and blood cultures have been negative each time they have been checked off abx thus far -labs to further investigate (will send results to Dr. Megan Salon when completed):  ESR, HIV ab assay, RF, ANA, SPEP, peripheral smear, cbc with diff recheck, iron panel recheck  2. Paroxysmal atrial fibrillation -in sinus this am, seems to fluctuate frequently and tachycardia is not always when in afib, sometimes sinus tach -cont to follow with cardiology and cont toprol xl 86m, eliquis anticoagulant, tikosyn antiarrhythmic and cardizem  3. Essential hypertension -bp when she is calm and afebrile seems to be under good control with aldactone 12.560mdaily, toprol-xl 5028maily, losartan 9m22maily, and her cardizem 120mg58mly -see below for parameters to make adjustments at home if needed  4. Anemia, chronic disease -seems her anemia is mixed -? Any hemolytic component also?--need peripheral smear  5. Anemia, iron deficiency -cont nu-iron and recheck cbc with diff, peripheral smear and iron panel, spep -if this is not improving, may also need hematology consult  The following have been established for her to follow at home:  If bp >160, increase losartan to a full tablet.  If bp still elevated at second check for the day, take a full tablet of spironolactone also.  If temp >103, may take tylenol.  If she is weak, tired, feels unsafe, HR>100, she is to call the [facility] nurse to evaluate her at her apt when she returns there.    Family/ staff Communication: discussed with rehab nurse, nurse manager  Labs/tests ordered:  See above  More than 30 minutes were spent with patient reviewing plan and ongoing concerns.  Yoshika Vensel L. Brahim Dolman, D.O. [facility] [address] [id number] Phone (Mon-Fri 8am-5pm):  [phone]:  [phone] prompts after 5pm & weekends Office Phone:  [phone] Fax:  [phone]

## 2015-03-08 LAB — POCT ERYTHROCYTE SEDIMENTATION RATE, NON-AUTOMATED: Sed Rate: 51 mm

## 2015-03-08 LAB — CBC AND DIFFERENTIAL
HCT: 29 % — AB (ref 36–46)
Hemoglobin: 8.9 g/dL — AB (ref 12.0–16.0)
Platelets: 440 10*3/uL — AB (ref 150–399)
WBC: 4.5 10^3/mL

## 2015-03-12 ENCOUNTER — Other Ambulatory Visit: Payer: Self-pay | Admitting: Internal Medicine

## 2015-03-14 ENCOUNTER — Encounter: Payer: Self-pay | Admitting: Internal Medicine

## 2015-03-14 ENCOUNTER — Ambulatory Visit (INDEPENDENT_AMBULATORY_CARE_PROVIDER_SITE_OTHER): Payer: Medicare Other | Admitting: Internal Medicine

## 2015-03-14 VITALS — BP 147/94 | HR 91 | Temp 98.6°F | Ht 66.0 in | Wt 171.5 lb

## 2015-03-14 DIAGNOSIS — M150 Primary generalized (osteo)arthritis: Secondary | ICD-10-CM

## 2015-03-14 DIAGNOSIS — R509 Fever, unspecified: Secondary | ICD-10-CM | POA: Diagnosis not present

## 2015-03-14 DIAGNOSIS — M1990 Unspecified osteoarthritis, unspecified site: Secondary | ICD-10-CM | POA: Insufficient documentation

## 2015-03-14 DIAGNOSIS — M159 Polyosteoarthritis, unspecified: Secondary | ICD-10-CM

## 2015-03-14 DIAGNOSIS — R634 Abnormal weight loss: Secondary | ICD-10-CM

## 2015-03-14 NOTE — Progress Notes (Signed)
[facility]  Reason for Consult: Fever of unknown origin Referring Physician: Dr. Hollace Kinnier  Patient Active Problem List   Diagnosis Date Noted  . Fever of unknown origin (FUO) 02/26/2015    Priority: High  . Unintentional weight loss 03/14/2015    Priority: Medium  . Anemia, chronic disease 02/26/2015    Priority: Medium  . Degenerative joint disease 03/14/2015  . Bronchiectasis   . Constipation 01/26/2015  . Lumbar compression fracture 01/08/2015  . Dizziness 07/19/2014  . Current use of long term anticoagulation 05/10/2014  . Paroxysmal atrial fibrillation 05/07/2014  . CAD (coronary artery disease), native coronary artery   . Ascending aortic aneurysm   . Overweight (BMI 25.0-29.9) 09/07/2013  . COPD GOLD II 07/09/2007  . Essential hypertension     Patient's Medications  New Prescriptions   No medications on file  Previous Medications   ACETAMINOPHEN (TYLENOL) 650 MG CR TABLET    Take 1,300 mg by mouth every morning.    ALBUTEROL (PROVENTIL HFA;VENTOLIN HFA) 108 (90 BASE) MCG/ACT INHALER    Inhale 1-2 puffs into the lungs every 6 (six) hours as needed for wheezing or shortness of breath.   ALPRAZOLAM (XANAX) 0.5 MG TABLET    Take 1 tablet (0.5 mg total) by mouth every 6 (six) hours as needed for anxiety or sleep.   BISACODYL PO    Take by mouth at bedtime.   CIPROFLOXACIN (CIPRO) 500 MG TABLET    Take 500 mg by mouth 2 (two) times daily.   DILTIAZEM (CARDIZEM CD) 120 MG 24 HR CAPSULE    Take 120 mg by mouth every morning.    DIPHENHYDRAMINE-ACETAMINOPHEN (TYLENOL PM) 25-500 MG TABS    Take 2 tablets by mouth at bedtime.   DOFETILIDE (TIKOSYN) 250 MCG CAPSULE    Take 1 capsule (250 mcg total) by mouth 2 (two) times daily.   ELIQUIS 5 MG TABS TABLET    Take 2.5 mg by mouth 2 (two) times daily.   GLUCOSAMINE-CHONDROITIN 500-400 MG TABLET    Take 1 tablet by mouth 2 (two) times daily.     IRON POLYSACCHARIDES (NIFEREX) 150 MG  CAPSULE    Take 150 mg by mouth. Take one tablet daily for iron   LOSARTAN (COZAAR) 100 MG TABLET    Take 50 mg by mouth every evening.    METOPROLOL SUCCINATE (TOPROL-XL) 50 MG 24 HR TABLET    Take 50 mg by mouth daily. Take with or immediately following a meal.   MULTIPLE VITAMINS-MINERALS (MULTIVITAMIN ADULT) TABS    Take by mouth. Take one tablet daily   POLYETHYL GLYCOL-PROPYL GLYCOL (SYSTANE OP)    Place 1 drop into both eyes every morning.   POLYETHYLENE GLYCOL (MIRALAX / GLYCOLAX) PACKET    Take 17 g by mouth daily.   SACCHAROMYCES BOULARDII (FLORASTOR PO)    Take by mouth. Probiotic take one twice daily   SPIRONOLACTONE (ALDACTONE) 25 MG TABLET    Take 12.5 mg by mouth daily.    SYMBICORT 160-4.5 MCG/ACT INHALER    USE 2 PUFFS TWICE DAILY.  Modified Medications   No medications on file  Discontinued Medications   No medications on file    Recommendations: 1. Observe off of antibiotics for now 2. Await results of final bronchoscopy cultures 3. Blood cultures 2 4. Abdominal and pelvic CT scan with and without contrast   Assessment: It is not entirely clear what is causing her fever of  unknown origin. However, the response to ciprofloxacin certainly does suggest a bacterial process. I suspect that she either has some form of smoldering pneumonia, possibly Mycobacterium avium or an intra-abdominal infection. I would like to watch her off of antibiotics now and obtain blood cultures and an abdominal and pelvic CT scan. I will call her once I have those results. I've asked her to call me if she develops recurrent fever.   HPI: [name] [name] is a 81 y.o. female who began developing fever, chills, sweats, fatigue and worsening shortness of breath in May. She was treated with empiric doxycycline without improvement. She was then switched to Levaquin and got a little bit better only to have the symptoms recur within about 1 week. She received a second round of Levaquin and again had some  transient improvement for her symptoms recurred. Since then she has received 3 short rounds of Cipro. Each time she was on Cipro she noted significant improvement only to relapse about one week after stopping. She completed her latest round of Cipro 1 week ago and feels like she is starting to get sick again. Coincident with this illness she has developed a new normocytic anemia. She has known history of COPD and bronchiectasis. Recent chest x-rays and CT scan showed stable by basilar, patchy infiltrates and also some increased mediastinal adenopathy what that was felt to be in "reactive". A recent urinalysis was normal. Sedimentation rate was moderately elevated at 51. HIV antibody was negative, ANA and rheumatoid factor are negative. She did have a fall in May and suffered an L3 fracture. The pain has been slowly improving. She notes the pain when she is up walking. The pain is rated at 4 out of 10 at its maximum recently. She does not need to take anything for pain. She underwent a bronchoscopy on 02/20/2015. Dr. Clois Comber noted normal-appearing airways. Routine gram, AFB and fungal stains were negative. Cultures grew Candida albicans and normal oral flora. Her AFB culture is negative to date.  Review of Systems: Constitutional: positive for anorexia, chills, fatigue, fevers, sweats and weight loss Eyes: negative Ears, nose, mouth, throat, and face: negative Respiratory: positive for dyspnea on exertion, negative for cough, pleurisy/chest pain and sputum Cardiovascular: positive for irregular heart beat, negative for chest pain Gastrointestinal: positive for constipation, negative for abdominal pain, diarrhea, nausea and vomiting Genitourinary:positive for nocturia, negative for dysuria Hematologic/lymphatic: positive for new normocytic anemia    Past Medical History  Diagnosis Date  . COPD (chronic obstructive pulmonary disease)     pft's 09/15/2005, FEV1 1.69 (76%) with ration67 and better x 14%  p B2, DLC 95%  . Hypertension   . Bronchitis, chronic   . Celiac disease   . Anxiety   . Pneumonia     hx of x3  . Acute blood loss anemia 09/07/13  . Anxiety state, unspecified   . Bronchiectasis 2012  . Cerebrovascular disease, unspecified 05/26/13  . Cerebral atrophy 05/26/13  . Anemia   . Atrial fibrillation   . Aneurysm of aorta     Social History  Substance Use Topics  . Smoking status: Former Smoker -- 0.50 packs/day for 60 years    Types: Cigarettes    Quit date: 01/27/2012  . Smokeless tobacco: Never Used  . Alcohol Use: Yes     Comment: Wine and Gin daily    Family History  Problem Relation Age of Onset  . Heart disease Father 65    heart attack  . Prostate cancer  Paternal Grandfather   . Pulmonary fibrosis Brother     former smoker   Allergies  Allergen Reactions  . Benicar [Olmesartan] Diarrhea and Other (See Comments)    Hypotension  . Metronidazole Nausea Only  . Morphine Hives    OBJECTIVE: Filed Vitals:   03/14/15 1402  BP: 147/94  Pulse: 91  Temp: 98.6 F (37 C)  TempSrc: Oral  Height: 5\' 6"  (1.676 m)  Weight: 171 lb 8 oz (77.792 kg)   Body mass index is 27.69 kg/(m^2).   General: Her weight is down 16 pounds since May Skin: No rash Lymph nodes: No palpable adenopathy Eyes: Normal external exam Oral: No oropharyngeal lesions Temporal arteries: Normal to palpation Lungs: Clear Cor: Regular S1 and S2 with no murmur Abdomen: Soft and nontender with no palpable masses Extremities: Healed surgical incisions over both hips and knees without evidence of active inflammation  Microbiology: No results found for this or any previous visit (from the past 240 hour(s)).  Michel Bickers, MD [facility] [phone] pager   [phone] cell 03/14/2015, 2:51 PM

## 2015-03-16 ENCOUNTER — Telehealth: Payer: Self-pay | Admitting: Internal Medicine

## 2015-03-16 ENCOUNTER — Telehealth: Payer: Self-pay | Admitting: *Deleted

## 2015-03-16 ENCOUNTER — Other Ambulatory Visit: Payer: Medicare Other

## 2015-03-16 ENCOUNTER — Other Ambulatory Visit: Payer: Self-pay | Admitting: Internal Medicine

## 2015-03-16 MED ORDER — CIPROFLOXACIN HCL 500 MG PO TABS: 500.0000 mg | ORAL_TABLET | Freq: Two times a day (BID) | ORAL | Status: DC

## 2015-03-16 NOTE — Telephone Encounter (Signed)
I received a phone call from [name] [name] today. Her fever has recurred. She's had fever up to 102.3. She feels very weak but otherwise has no other new symptoms. She has been taking acetaminophen. Her bronchoscopy AFB cultures remain negative. Recent blood cultures remain negative. A CT scan of her abdomen and pelvis is scheduled for one week from today.I discussed options with her and her daughter. I offered the option of facilitating admission to the hospital for more rapid CT scan and further diagnostic evaluation but they do not want that. At this point, with the outpatient CT scan 1 week away, I feel that the lesser evil is to restart ciprofloxacin. She will call me once the CT scan has been done.

## 2015-03-16 NOTE — Telephone Encounter (Signed)
Left message with Authorization code for CT ABD/Pelvis= [id number] [id number].

## 2015-03-17 ENCOUNTER — Encounter: Payer: Self-pay | Admitting: Internal Medicine

## 2015-03-19 ENCOUNTER — Telehealth: Payer: Self-pay | Admitting: Internal Medicine

## 2015-03-19 ENCOUNTER — Ambulatory Visit
Admission: RE | Admit: 2015-03-19 | Discharge: 2015-03-19 | Disposition: A | Discharge status: Home / Self Care | Payer: Medicare Other | Source: Ambulatory Visit | Attending: Internal Medicine | Admitting: Internal Medicine

## 2015-03-19 ENCOUNTER — Other Ambulatory Visit: Payer: Self-pay | Admitting: Internal Medicine

## 2015-03-19 DIAGNOSIS — R509 Fever, unspecified: Secondary | ICD-10-CM

## 2015-03-19 DIAGNOSIS — R197 Diarrhea, unspecified: Secondary | ICD-10-CM | POA: Insufficient documentation

## 2015-03-19 LAB — FUNGUS CULTURE W SMEAR
Fungal Smear: NONE SEEN
SPECIAL REQUESTS: NORMAL

## 2015-03-19 MED ORDER — IOPAMIDOL (ISOVUE-300) INJECTION 61%
100.0000 mL | Freq: Once | INTRAVENOUS | Status: AC | PRN
  Administered 2015-03-19: 100 mL via INTRAVENOUS

## 2015-03-19 NOTE — Telephone Encounter (Signed)
Blood cultures remain negative at 4 days.  AFB cultures from bronchoscopy specimens 02/20/2015 remain negative.  CT ABDOMEN AND PELVIS WITH CONTRAST  TECHNIQUE: Multidetector CT imaging of the abdomen and pelvis was performed using the standard protocol following bolus administration of intravenous contrast.  CONTRAST: 178mL ISOVUE-300 IOPAMIDOL (ISOVUE-300) INJECTION 61%  COMPARISON: No priors.  FINDINGS: Lower chest: Bronchial wall thickening with peribronchovascular micro and macronodularity and architectural distortion in the periphery of the right lower lobe, similar to prior examination 11/27/2014 presumably related to chronic infection such is MAI. Enlarged juxta pericardiac lymph node immediately adjacent to the right atrium measuring 15 mm in short axis, slightly enlarged compared to the prior examination.  Hepatobiliary: No cystic or solid hepatic lesions. No intra or extrahepatic biliary ductal dilatation. Gallbladder is normal in appearance.  Pancreas: No pancreatic mass. No pancreatic ductal dilatation. No pancreatic or peripancreatic fluid or inflammatory changes.  Spleen: Unremarkable.  Adrenals/Urinary Tract: Bilateral adrenal glands are normal in appearance. Sub cm low-attenuation lesions in the kidneys bilaterally too small to definitively characterize, but are statistically likely to represent small cysts. 1.8 cm well-defined low-attenuation lesion in the lower pole of the left kidney is compatible with a simple cyst. No hydroureteronephrosis to indicate urinary tract obstruction at this time. The urinary bladder is nearly completely obscured by extensive beam hardening artifact from the patient's bilateral total hip arthroplasties.  Stomach/Bowel: The appearance of the stomach is normal. No pathologic dilatation of small bowel or colon. Numerous colonic diverticulae are noted. In the well visualized portions of the abdomen and pelvis (much  of the pelvis is obscured by beam hardening artifact) there are no surrounding inflammatory changes to suggest an acute diverticulitis at this time. While the proximal aspect of the appendix is normal in appearance, the distal aspect appears mildly dilated and thickened, measuring up to 10 mm in diameter. No overt surrounding inflammatory changes are noted.  Vascular/Lymphatic: Atherosclerosis throughout the abdominal and pelvic vasculature, without evidence of aneurysm or dissection. Multiple borderline enlarged retroperitoneal and upper abdominal lymph nodes are noted measuring up to 8 mm in short axis, but are nonspecific.  Reproductive: Much of the pelvis is obscured by beam hardening artifacts, accurate evaluation of uterus and ovaries is not possible on today's examination.  Other: No significant volume of ascites. No pneumoperitoneum.  Musculoskeletal: Chronic appearing burst fracture of L3 with approximately 70% loss of central vertebral body height and 8 mm are retropulsion of fracture fragments impinging upon the central spinal canal. 7 mm of anterolisthesis of L4 upon L5. Status post bilateral total hip arthroplasty. There are no aggressive appearing lytic or blastic lesions noted in the visualized portions of the skeleton.  IMPRESSION: 1. No definite source for fever is confidently identified on today's examination. However, the distal aspect of the appendix appears mildly enlarged and thickened (10 mm in diameter), such that the possibility of chronic appendicitis should be considered. No overt inflammatory changes around the appendix at this time to suggest an acute appendicitis. 2. Multiple borderline enlarged upper abdominal and retroperitoneal lymph nodes, and a mildly enlarged right-sided juxta pericardiac lymph node. These findings are nonspecific, and the lower thoracic lymph node may be reactive related to chronic pulmonary infection, which may also be  a source for fevers and chills. 3. Colonic diverticulosis without evidence of acute diverticulitis at this time. 4. Additional incidental findings, as above.  I spoke to [name] [name] again by phone this evening. She did restart ciprofloxacin 3 days ago but continues to have  high fever up to 103.3. She is also started having profuse diarrhea. I'm concerned that she may have developed C. difficile colitis due to her recent ciprofloxacin therapy. She will come in tomorrow to get a specimen So we can test her for C. difficile. If if she does not have C. difficile colitis and continues to have the fevers I will consider referral for hematology-oncology evaluation given her anemia and the lymphadenopathy noted on recent chest CT scan and today's abdominal CT scan. She may need a bone marrow evaluation for her recurrent fevers given her evolving anemia.   Electronically Signed  By: Vinnie Langton M.D.  On: 03/19/2015 14:54

## 2015-03-20 LAB — CULTURE, BLOOD (SINGLE)
ORGANISM ID, BACTERIA: NO GROWTH
Organism ID, Bacteria: NO GROWTH

## 2015-03-21 ENCOUNTER — Encounter: Payer: Self-pay | Admitting: Internal Medicine

## 2015-03-21 ENCOUNTER — Other Ambulatory Visit: Payer: Medicare Other

## 2015-03-21 DIAGNOSIS — R197 Diarrhea, unspecified: Secondary | ICD-10-CM

## 2015-03-22 ENCOUNTER — Other Ambulatory Visit: Payer: Self-pay

## 2015-03-22 ENCOUNTER — Telehealth: Payer: Self-pay | Admitting: Internal Medicine

## 2015-03-22 LAB — CLOSTRIDIUM DIFFICILE BY PCR: CDIFFPCR: NOT DETECTED

## 2015-03-22 NOTE — Telephone Encounter (Signed)
I spoke to [name] [name] and her daughter again this afternoon. She continues to have high fevers from 101-103 degrees despite being back on ciprofloxacin for 6 days now. She is having rigors and sweats nightly. She is progressively weaker and thinks she is losing more weight. She is no longer having diarrhea. She is now constipated. Her stool C. Difficile assay is negative. Her recent blood cultures are final and negative. Her AFB cultures from her recent bronchoscopy remain negative at 4 weeks. Her chest CT done in April and her recent CT scan of her abdomen and pelvis did reveal some increased lymphadenopathy. I talked to them about diagnostic options at this point. I believe it is best that we bring her into the hospital for further evaluation and treatment. Her fevers have been occurring off and on since April and a fairly extensive outpatient evaluation has failed to reveal the cause. I've asked her stop taking ciprofloxacin tonight. I will try to contact her primary care physician, Dr. Jose Persia, in the morning and facilitate admission to the hospital.

## 2015-03-23 ENCOUNTER — Encounter (HOSPITAL_COMMUNITY): Payer: Self-pay

## 2015-03-23 ENCOUNTER — Other Ambulatory Visit: Payer: Medicare Other

## 2015-03-23 ENCOUNTER — Inpatient Hospital Stay (HOSPITAL_COMMUNITY): Payer: Medicare Other

## 2015-03-23 ENCOUNTER — Inpatient Hospital Stay (HOSPITAL_COMMUNITY)
Admission: AD | Admit: 2015-03-23 | Discharge: 2015-03-29 | DRG: 824 | Disposition: A | Discharge status: Home / Self Care | Payer: Medicare Other | Source: Ambulatory Visit | Attending: Internal Medicine | Admitting: Internal Medicine

## 2015-03-23 DIAGNOSIS — I480 Paroxysmal atrial fibrillation: Secondary | ICD-10-CM | POA: Diagnosis present

## 2015-03-23 DIAGNOSIS — J479 Bronchiectasis, uncomplicated: Secondary | ICD-10-CM

## 2015-03-23 DIAGNOSIS — I482 Chronic atrial fibrillation: Secondary | ICD-10-CM | POA: Diagnosis present

## 2015-03-23 DIAGNOSIS — D638 Anemia in other chronic diseases classified elsewhere: Secondary | ICD-10-CM | POA: Diagnosis present

## 2015-03-23 DIAGNOSIS — J984 Other disorders of lung: Secondary | ICD-10-CM

## 2015-03-23 DIAGNOSIS — I679 Cerebrovascular disease, unspecified: Secondary | ICD-10-CM | POA: Diagnosis present

## 2015-03-23 DIAGNOSIS — R599 Enlarged lymph nodes, unspecified: Secondary | ICD-10-CM | POA: Diagnosis not present

## 2015-03-23 DIAGNOSIS — E440 Moderate protein-calorie malnutrition: Secondary | ICD-10-CM | POA: Insufficient documentation

## 2015-03-23 DIAGNOSIS — Z888 Allergy status to other drugs, medicaments and biological substances status: Secondary | ICD-10-CM | POA: Diagnosis not present

## 2015-03-23 DIAGNOSIS — I739 Peripheral vascular disease, unspecified: Secondary | ICD-10-CM | POA: Diagnosis present

## 2015-03-23 DIAGNOSIS — D649 Anemia, unspecified: Secondary | ICD-10-CM

## 2015-03-23 DIAGNOSIS — Z91040 Latex allergy status: Secondary | ICD-10-CM | POA: Diagnosis not present

## 2015-03-23 DIAGNOSIS — K900 Celiac disease: Secondary | ICD-10-CM | POA: Diagnosis present

## 2015-03-23 DIAGNOSIS — Z809 Family history of malignant neoplasm, unspecified: Secondary | ICD-10-CM

## 2015-03-23 DIAGNOSIS — J189 Pneumonia, unspecified organism: Secondary | ICD-10-CM

## 2015-03-23 DIAGNOSIS — Z885 Allergy status to narcotic agent status: Secondary | ICD-10-CM | POA: Diagnosis not present

## 2015-03-23 DIAGNOSIS — R918 Other nonspecific abnormal finding of lung field: Secondary | ICD-10-CM | POA: Diagnosis not present

## 2015-03-23 DIAGNOSIS — I2510 Atherosclerotic heart disease of native coronary artery without angina pectoris: Secondary | ICD-10-CM | POA: Diagnosis present

## 2015-03-23 DIAGNOSIS — C8124 Mixed cellularity classical Hodgkin lymphoma, lymph nodes of axilla and upper limb: Principal | ICD-10-CM | POA: Diagnosis present

## 2015-03-23 DIAGNOSIS — Z7901 Long term (current) use of anticoagulants: Secondary | ICD-10-CM

## 2015-03-23 DIAGNOSIS — I10 Essential (primary) hypertension: Secondary | ICD-10-CM | POA: Diagnosis present

## 2015-03-23 DIAGNOSIS — Z87891 Personal history of nicotine dependence: Secondary | ICD-10-CM

## 2015-03-23 DIAGNOSIS — R590 Localized enlarged lymph nodes: Secondary | ICD-10-CM | POA: Diagnosis present

## 2015-03-23 DIAGNOSIS — I4891 Unspecified atrial fibrillation: Secondary | ICD-10-CM

## 2015-03-23 DIAGNOSIS — F411 Generalized anxiety disorder: Secondary | ICD-10-CM | POA: Diagnosis present

## 2015-03-23 DIAGNOSIS — R591 Generalized enlarged lymph nodes: Secondary | ICD-10-CM | POA: Diagnosis not present

## 2015-03-23 DIAGNOSIS — Z96643 Presence of artificial hip joint, bilateral: Secondary | ICD-10-CM | POA: Diagnosis present

## 2015-03-23 DIAGNOSIS — J449 Chronic obstructive pulmonary disease, unspecified: Secondary | ICD-10-CM | POA: Diagnosis present

## 2015-03-23 DIAGNOSIS — Z7951 Long term (current) use of inhaled steroids: Secondary | ICD-10-CM

## 2015-03-23 DIAGNOSIS — R509 Fever, unspecified: Secondary | ICD-10-CM | POA: Diagnosis present

## 2015-03-23 DIAGNOSIS — Z9889 Other specified postprocedural states: Secondary | ICD-10-CM | POA: Diagnosis not present

## 2015-03-23 DIAGNOSIS — R634 Abnormal weight loss: Secondary | ICD-10-CM

## 2015-03-23 DIAGNOSIS — R700 Elevated erythrocyte sedimentation rate: Secondary | ICD-10-CM | POA: Diagnosis not present

## 2015-03-23 DIAGNOSIS — K5900 Constipation, unspecified: Secondary | ICD-10-CM | POA: Diagnosis present

## 2015-03-23 DIAGNOSIS — G319 Degenerative disease of nervous system, unspecified: Secondary | ICD-10-CM | POA: Diagnosis present

## 2015-03-23 DIAGNOSIS — Z8249 Family history of ischemic heart disease and other diseases of the circulatory system: Secondary | ICD-10-CM | POA: Diagnosis not present

## 2015-03-23 DIAGNOSIS — Z96653 Presence of artificial knee joint, bilateral: Secondary | ICD-10-CM | POA: Diagnosis present

## 2015-03-23 DIAGNOSIS — D509 Iron deficiency anemia, unspecified: Secondary | ICD-10-CM | POA: Diagnosis not present

## 2015-03-23 LAB — URINE MICROSCOPIC-ADD ON

## 2015-03-23 LAB — COMPREHENSIVE METABOLIC PANEL
ALBUMIN: 2.7 g/dL — AB (ref 3.5–5.0)
ALT: 33 U/L (ref 14–54)
AST: 39 U/L (ref 15–41)
Alkaline Phosphatase: 58 U/L (ref 38–126)
Anion gap: 10 (ref 5–15)
BUN: 15 mg/dL (ref 6–20)
CHLORIDE: 96 mmol/L — AB (ref 101–111)
CO2: 27 mmol/L (ref 22–32)
Calcium: 9.6 mg/dL (ref 8.9–10.3)
Creatinine, Ser: 0.82 mg/dL (ref 0.44–1.00)
GFR calc Af Amer: >60 mL/min (ref >60)
GFR calc non Af Amer: >60 mL/min (ref >60)
GLUCOSE: 120 mg/dL — AB (ref 65–99)
POTASSIUM: 4.1 mmol/L (ref 3.5–5.1)
SODIUM: 133 mmol/L — AB (ref 135–145)
Total Bilirubin: 0.3 mg/dL (ref 0.3–1.2)
Total Protein: 6.6 g/dL (ref 6.5–8.1)

## 2015-03-23 LAB — URINALYSIS, ROUTINE W REFLEX MICROSCOPIC
Bilirubin Urine: NEGATIVE
GLUCOSE, UA: NEGATIVE mg/dL
Ketones, ur: NEGATIVE mg/dL
LEUKOCYTES UA: NEGATIVE
Nitrite: NEGATIVE
PH: 5.5 (ref 5.0–8.0)
Protein, ur: NEGATIVE mg/dL
Specific Gravity, Urine: 1.009 (ref 1.005–1.030)
Urobilinogen, UA: 0.2 mg/dL (ref 0.0–1.0)

## 2015-03-23 LAB — CBC WITH DIFFERENTIAL/PLATELET
Basophils Absolute: 0 10*3/uL (ref 0.0–0.1)
Basophils Relative: 0 % (ref 0–1)
EOS PCT: 1 % (ref 0–5)
Eosinophils Absolute: 0.1 10*3/uL (ref 0.0–0.7)
HCT: 25.2 % — ABNORMAL LOW (ref 36.0–46.0)
Hemoglobin: 8 g/dL — ABNORMAL LOW (ref 12.0–15.0)
LYMPHS ABS: 0.8 10*3/uL (ref 0.7–4.0)
LYMPHS PCT: 17 % (ref 12–46)
MCH: 26.1 pg (ref 26.0–34.0)
MCHC: 31.7 g/dL (ref 30.0–36.0)
MCV: 82.4 fL (ref 78.0–100.0)
MONO ABS: 0.7 10*3/uL (ref 0.1–1.0)
MONOS PCT: 14 % — AB (ref 3–12)
Neutro Abs: 3.2 10*3/uL (ref 1.7–7.7)
Neutrophils Relative %: 68 % (ref 43–77)
PLATELETS: 327 10*3/uL (ref 150–400)
RBC: 3.06 MIL/uL — AB (ref 3.87–5.11)
RDW: 16.2 % — ABNORMAL HIGH (ref 11.5–15.5)
WBC: 4.7 10*3/uL (ref 4.0–10.5)

## 2015-03-23 LAB — OCCULT BLOOD X 1 CARD TO LAB, STOOL: Fecal Occult Bld: NEGATIVE

## 2015-03-23 LAB — C-REACTIVE PROTEIN: CRP: 19.3 mg/dL — ABNORMAL HIGH (ref <1.0)

## 2015-03-23 LAB — TSH: TSH: 1.641 u[IU]/mL (ref 0.350–4.500)

## 2015-03-23 LAB — SEDIMENTATION RATE: Sed Rate: 108 mm/hr — ABNORMAL HIGH (ref 0–22)

## 2015-03-23 MED ORDER — DOFETILIDE 250 MCG PO CAPS
250.0000 ug | ORAL_CAPSULE | Freq: Two times a day (BID) | ORAL | Status: DC
  Administered 2015-03-23 – 2015-03-29 (×12): 250 ug via ORAL
  Filled 2015-03-23 (×17): qty 1

## 2015-03-23 MED ORDER — ONDANSETRON HCL 4 MG/2ML IJ SOLN
4.0000 mg | Freq: Four times a day (QID) | INTRAMUSCULAR | Status: DC | PRN
  Administered 2015-03-27: 4 mg via INTRAVENOUS

## 2015-03-23 MED ORDER — ACETAMINOPHEN 650 MG RE SUPP: 650.0000 mg | Freq: Four times a day (QID) | RECTAL | Status: DC | PRN

## 2015-03-23 MED ORDER — DILTIAZEM HCL ER COATED BEADS 120 MG PO CP24
120.0000 mg | ORAL_CAPSULE | Freq: Every morning | ORAL | Status: DC
  Administered 2015-03-24 – 2015-03-29 (×5): 120 mg via ORAL
  Filled 2015-03-23 (×6): qty 1

## 2015-03-23 MED ORDER — METOPROLOL SUCCINATE ER 50 MG PO TB24: 50.0000 mg | ORAL_TABLET | Freq: Every day | ORAL | Status: DC

## 2015-03-23 MED ORDER — BUDESONIDE-FORMOTEROL FUMARATE 160-4.5 MCG/ACT IN AERO
2.0000 | INHALATION_SPRAY | Freq: Two times a day (BID) | RESPIRATORY_TRACT | Status: DC
  Administered 2015-03-23 – 2015-03-27 (×3): 2 via RESPIRATORY_TRACT
  Filled 2015-03-23 (×2): qty 6

## 2015-03-23 MED ORDER — ENSURE ENLIVE PO LIQD
237.0000 mL | Freq: Two times a day (BID) | ORAL | Status: DC
  Administered 2015-03-26: 237 mL via ORAL

## 2015-03-23 MED ORDER — OXYCODONE HCL 5 MG PO TABS: 5.0000 mg | ORAL_TABLET | ORAL | Status: DC | PRN

## 2015-03-23 MED ORDER — SODIUM CHLORIDE 0.9 % IJ SOLN
3.0000 mL | Freq: Two times a day (BID) | INTRAMUSCULAR | Status: DC
  Administered 2015-03-24 – 2015-03-29 (×8): 3 mL via INTRAVENOUS

## 2015-03-23 MED ORDER — APIXABAN 2.5 MG PO TABS
2.5000 mg | ORAL_TABLET | Freq: Two times a day (BID) | ORAL | Status: DC
  Administered 2015-03-23 – 2015-03-25 (×5): 2.5 mg via ORAL
  Filled 2015-03-23 (×5): qty 1

## 2015-03-23 MED ORDER — SACCHAROMYCES BOULARDII 250 MG PO CAPS
250.0000 mg | ORAL_CAPSULE | Freq: Two times a day (BID) | ORAL | Status: DC
  Administered 2015-03-23 – 2015-03-29 (×12): 250 mg via ORAL
  Filled 2015-03-23 (×12): qty 1

## 2015-03-23 MED ORDER — METOPROLOL SUCCINATE ER 50 MG PO TB24
50.0000 mg | ORAL_TABLET | Freq: Every day | ORAL | Status: DC
  Administered 2015-03-23 – 2015-03-28 (×6): 50 mg via ORAL
  Filled 2015-03-23 (×6): qty 1

## 2015-03-23 MED ORDER — SODIUM CHLORIDE 0.9 % IV SOLN
INTRAVENOUS | Status: DC
  Administered 2015-03-23 – 2015-03-24 (×3): via INTRAVENOUS

## 2015-03-23 MED ORDER — ONDANSETRON HCL 4 MG PO TABS: 4.0000 mg | ORAL_TABLET | Freq: Four times a day (QID) | ORAL | Status: DC | PRN

## 2015-03-23 MED ORDER — ALUM & MAG HYDROXIDE-SIMETH 200-200-20 MG/5ML PO SUSP: 30.0000 mL | Freq: Four times a day (QID) | ORAL | Status: DC | PRN

## 2015-03-23 MED ORDER — ACETAMINOPHEN 325 MG PO TABS
650.0000 mg | ORAL_TABLET | Freq: Four times a day (QID) | ORAL | Status: DC | PRN
  Administered 2015-03-23: 650 mg via ORAL
  Filled 2015-03-23: qty 2

## 2015-03-23 MED ORDER — ALPRAZOLAM 0.5 MG PO TABS
0.5000 mg | ORAL_TABLET | Freq: Once | ORAL | Status: DC
  Filled 2015-03-23: qty 1

## 2015-03-23 NOTE — Progress Notes (Signed)
Admission note:  Arrival Method: Wheelchair from clinic Mental Orientation: A&OX4 Telemetry: N/A Assessment: See flowsheet Skin: Dry; intact; bruising on bilateral arms and hands IV: N/A Pain: Denies Tubes: N/A Safety Measures: bed in lowest position, call light within reach, bed alarm activated, all belongings within reach [place]: Reviewed with pt.  Admission Screening: To be completed 6700 Orientation: Patient has been oriented to the unit, staff and to the room.  MD paged on pt's arrival. Awaiting orders. Will continue to monitor pt.   Carole Civil, RN

## 2015-03-23 NOTE — Progress Notes (Signed)
Patient has her home medication at bedside.  RN explained that we will dispense the hospital's medication to her in lieu of her home medications.  Patient stated she would not take her home medications, but administered 2 puffs of her home Symbicort because she did not want to wait for respiratory therapy.  Notified Dr. Daryll Drown.  Stated this was ok as long as pharmacy would review her Symbicort.  No new orders at this time.  Will continue to monitor.

## 2015-03-23 NOTE — H&P (Signed)
[facility] History and Physical  MARNITA POIRIER GDJ:[id number] DOB: Jul 29, 1931 DOA: 03/23/2015  Referring physician:  PCP: Hollace Kinnier, DO   Chief Complaint: Fever  HPI: ALEK PONCEDELEON is a 81 y.o. female with a past medical history of atrial fibrillation, chronic objective pulmonary disease, anemia, presenting as a direct admission for further workup of fever of unknown origin. Symptoms started back in a month of May where she developed fevers, chills, night sweats, fatigue, shortness of breath. She received empiric antibiotic therapy with doxycycline and Levaquin with symptoms recurring. She received several courses of Cipro with symptoms recurring after its discontinuation. A CT scan of lungs without contrast performed on 11/27/2014 revealing mediastinal lymphadenopathy felt to be reactive. Radiology reported that there were spectrum of findings in the lungs that could suggest chronic indolent atypical infection such as Mycobacterium avium intracellulare. On 02/20/2015 she underwent bronchoscopy, procedure performed by Dr. Melvyn Novas which revealed grossly normal  Airways. On bronchial alveolar lavage she had nonpathologic or pharyngeal type flora isolated. Cultures remain negative as there was no acid-fast bacilli. Other workup includes HIV antibody, ANA and rheumatoid factor which have been negative. She continues to have intermittent fevers, spiking a temperature of 103 on 03/19/2015. She also complains of ongoing night sweats, generalized weakness, fatigue. Dr Megan Salon of infectious disease has recommended hospitalization for further workup.                                       Review of Systems:  Constitutional:  Positive for weight loss, night sweats, Fevers, chills, fatigue.  HEENT:  No headaches, Difficulty swallowing,Tooth/dental problems,Sore throat,  No sneezing, itching, ear ache, nasal congestion, post nasal drip,  Cardio-vascular:  No chest pain, Orthopnea, PND, swelling in  lower extremities, anasarca, dizziness, palpitations  GI:  No heartburn, indigestion, abdominal pain, nausea, vomiting, diarrhea, change in bowel habits, loss of appetite  Resp:  No shortness of breath with exertion or at rest. No excess mucus, no productive cough, No non-productive cough, No coughing up of blood.No change in color of mucus.No wheezing.No chest wall deformity  Skin:  no rash or lesions.  GU:  no dysuria, change in color of urine, no urgency or frequency. No flank pain.  Musculoskeletal:  No joint pain or swelling. No decreased range of motion. No back pain.  Psych:  No change in mood or affect. No depression or anxiety. No memory loss.   Past Medical History  Diagnosis Date  . COPD (chronic obstructive pulmonary disease)     pft's 09/15/2005, FEV1 1.69 (76%) with ration67 and better x 14% p B2, DLC 95%  . Hypertension   . Bronchitis, chronic   . Celiac disease   . Anxiety   . Pneumonia     hx of x3  . Acute blood loss anemia 09/07/13  . Anxiety state, unspecified   . Bronchiectasis 2012  . Cerebrovascular disease, unspecified 05/26/13  . Cerebral atrophy 05/26/13  . Anemia   . Atrial fibrillation   . Aneurysm of aorta    Past Surgical History  Procedure Laterality Date  . Nasal sinus surgery  years ago  . Total knee arthroplasty Bilateral [id number]  . Excision morton's neuroma Bilateral 35 years ago, 2010  . Total hip arthroplasty Right 2009    Dr. Telford Nab  . Cataract extraction Bilateral ~8 years ago  . Total hip arthroplasty Left 09/06/2013    Dr. Salli Quarry  .  Video bronchoscopy Bilateral 02/20/2015    Procedure: VIDEO BRONCHOSCOPY WITHOUT FLUORO;  Surgeon: Tanda Rockers, MD;  Location: [facility];  Service: Cardiopulmonary;  Laterality: Bilateral;   Social History:  reports that she quit smoking about 3 years ago. Her smoking use included Cigarettes. She has a 30 pack-year smoking history. She has never used smokeless tobacco. She reports that she drinks  alcohol. She reports that she does not use illicit drugs.  Allergies  Allergen Reactions  . Benicar [Olmesartan] Diarrhea and Other (See Comments)    Hypotension  . Metronidazole Nausea Only  . Morphine Hives    Family History  Problem Relation Age of Onset  . Heart disease Father 75    heart attack  . Prostate cancer Paternal Grandfather   . Pulmonary fibrosis Brother     former smoker    Prior to Admission medications   Medication Sig Start Date End Date Taking? Authorizing Provider  acetaminophen (TYLENOL) 650 MG CR tablet Take 1,300 mg by mouth every morning.     Historical Provider, MD  albuterol (PROVENTIL HFA;VENTOLIN HFA) 108 (90 BASE) MCG/ACT inhaler Inhale 1-2 puffs into the lungs every 6 (six) hours as needed for wheezing or shortness of breath. 08/14/14   Tanda Rockers, MD  ALPRAZolam Duanne Moron) 0.5 MG tablet Take 1 tablet (0.5 mg total) by mouth every 6 (six) hours as needed for anxiety or sleep. 02/07/15   Tiffany L Reed, DO  BISACODYL PO Take by mouth at bedtime.    Historical Provider, MD  ciprofloxacin (CIPRO) 500 MG tablet Take 1 tablet (500 mg total) by mouth 2 (two) times daily. 03/16/15   Michel Bickers, MD  diltiazem (CARDIZEM CD) 120 MG 24 hr capsule Take 120 mg by mouth every morning.  05/05/14   Historical Provider, MD  diphenhydramine-acetaminophen (TYLENOL PM) 25-500 MG TABS Take 2 tablets by mouth at bedtime.    Historical Provider, MD  dofetilide (TIKOSYN) 250 MCG capsule Take 1 capsule (250 mcg total) by mouth 2 (two) times daily. 05/10/14   Jacolyn Reedy, MD  ELIQUIS 5 MG TABS tablet Take 2.5 mg by mouth 2 (two) times daily. 12/26/14   Historical Provider, MD  glucosamine-chondroitin 500-400 MG tablet Take 1 tablet by mouth 2 (two) times daily.      Historical Provider, MD  iron polysaccharides (NIFEREX) 150 MG capsule Take 150 mg by mouth. Take one tablet daily for iron    Historical Provider, MD  losartan (COZAAR) 100 MG tablet Take 50 mg by mouth every  evening.     Historical Provider, MD  metoprolol succinate (TOPROL-XL) 50 MG 24 hr tablet Take 50 mg by mouth daily. Take with or immediately following a meal.    Historical Provider, MD  Multiple Vitamins-Minerals (MULTIVITAMIN ADULT) TABS Take by mouth. Take one tablet daily    Historical Provider, MD  Polyethyl Glycol-Propyl Glycol (SYSTANE OP) Place 1 drop into both eyes every morning.    Historical Provider, MD  polyethylene glycol (MIRALAX / GLYCOLAX) packet Take 17 g by mouth daily.    Historical Provider, MD  Saccharomyces boulardii (FLORASTOR PO) Take by mouth. Probiotic take one twice daily    Historical Provider, MD  spironolactone (ALDACTONE) 25 MG tablet Take 12.5 mg by mouth daily.  12/26/14   Historical Provider, MD  SYMBICORT 160-4.5 MCG/ACT inhaler USE 2 PUFFS TWICE DAILY. 03/13/15   Gayland Curry, DO   Physical Exam: Filed Vitals:   03/23/15 1333  BP: 89/54  Pulse: 78  Temp: 97.7 F (36.5 C)  TempSrc: Oral  Resp: 18  Height: 5' 6"  (1.676 m)  Weight: 77.4 kg (170 lb 10.2 oz)  SpO2: 97%    Wt Readings from Last 3 Encounters:  03/23/15 77.4 kg (170 lb 10.2 oz)  03/14/15 77.792 kg (171 lb 8 oz)  03/06/15 76.114 kg (167 lb 12.8 oz)    General:  Appears calm and comfortable, currently she is nontoxic appearing, awake and alert, in no acute distress. Eyes: PERRL, normal lids, irises & conjunctiva ENT: grossly normal hearing, lips & tongue Neck: no LAD, masses or thyromegaly Cardiovascular: RRR, no m/r/g. No LE edema. Telemetry: SR, no arrhythmias  Respiratory: CTA bilaterally, no w/r/r. Normal respiratory effort. Abdomen: soft, ntnd Skin: no rash or induration seen on limited exam Musculoskeletal: grossly normal tone BUE/BLE Psychiatric: grossly normal mood and affect, speech fluent and appropriate Neurologic: grossly non-focal.          Labs on Admission:  Basic Metabolic Panel: No results for input(s): NA, K, CL, CO2, GLUCOSE, BUN, CREATININE, CALCIUM, MG,  PHOS in the last 168 hours. Liver Function Tests: No results for input(s): AST, ALT, ALKPHOS, BILITOT, PROT, ALBUMIN in the last 168 hours. No results for input(s): LIPASE, AMYLASE in the last 168 hours. No results for input(s): AMMONIA in the last 168 hours. CBC: No results for input(s): WBC, NEUTROABS, HGB, HCT, MCV, PLT in the last 168 hours. Cardiac Enzymes: No results for input(s): CKTOTAL, CKMB, CKMBINDEX, TROPONINI in the last 168 hours.  BNP (last 3 results) No results for input(s): BNP in the last 8760 hours.  ProBNP (last 3 results)  Recent Labs  07/18/14 2357  PROBNP 386.2    CBG: No results for input(s): GLUCAP in the last 168 hours.  Radiological Exams on Admission: No results found.  EKG: Independently reviewed.   Assessment/Plan Principal Problem:   FUO (fever of unknown origin) Active Problems:   Essential hypertension   Paroxysmal atrial fibrillation   Current use of long term anticoagulation   Fever of unknown origin (FUO)   Anemia, chronic disease   1. Fever of unknown origin. Patient is a pleasant 80 year old female presenting ongoing fevers since May despite receiving multiple courses of antibiotic therapy which has included doxycycline, Levaquin and ciprofloxacin. She has undergone an extensive workup which included CT scan of lungs, abdomen and pelvis performed on 03/19/2015 which did not show definite source of fever. Radiology did report multiple enlarged upper abdominal and retroperitoneal lymph nodes. The mother July she also had bronchoscopy with bronchial alveolar lavage which was unremarkable. HIV, ANA, rheumatoid factor negative. I discussed case with Dr. Megan Salon of infectious disease who recommended obtaining compress metabolic panel, CBC, chest x-ray along with a hematology consult for consideration of a bone marrow biopsy. He recommended holding off on antibiotic therapy and monitoring. On initial presentation she is afebrile, will monitor  vitals, admitted to telemetry. 2. Paroxysmal atrial fibrillation. She is currently rate controlled, will continue dofetilide, metoprolol 50 mg by mouth daily and diltiazem 120 mg by mouth daily. Will also continue anticoagulation with Eliquis 2.5 mg PO BID  3. Hypertension. Patient on multiple antihypertensives agents, will continue metoprolol and Cardizem, monitor blood pressures. 4. Normocytic anemia. Dr Megan Salon recommending heme/onc consultation for consideration of bone marrow biopsy. We'll check a CBC. Last hemoglobin is 8.9 on 03/08/2015. She is on chronic anticoagulation as well, denies gross blood, will check stool for Hemoccult.    Code Status: Full code DVT Prophylaxis: Anticoagulated with Eliquis Family Communication:  Family not present Disposition Plan: Will admit her to the inpatient service, anticipate she'll require greater than 2 nights hospitalization  Time spent: 70 minutes  Kelvin Cellar [facility] Pager [phone]

## 2015-03-23 NOTE — Progress Notes (Signed)
Initial Nutrition Assessment  DOCUMENTATION CODES:   Non-severe (moderate) malnutrition in context of chronic illness  INTERVENTION:   Provide Gluten Free diet once advanced.   Supplement diet as appropriate once advanced   NUTRITION DIAGNOSIS:   Increased nutrient needs related to catabolic illness as evidenced by estimated needs.   GOAL:   Patient will meet greater than or equal to 90% of their needs   MONITOR:   PO intake, Supplement acceptance, Diet advancement, Labs, Weight trends, I & O's  REASON FOR ASSESSMENT:   Malnutrition Screening Tool    ASSESSMENT:   Pt with hx of celiac disease admitted from office for work up of continued fevers since April 2016.    Pt reports that she has lost 15 lb in the last couple of months. She has had a decreased appetite, however per 24 hr recall intake is adequate.  Pt has had a low iron and she has been trying to get in iron fortified foods. She has been constipated recently and is currently drinking prune juice.  Pt lives at [facility] and has been getting her meals delivered  Wt Readings from Last 20 Encounters:  03/23/15 170 lb 10.2 oz (77.4 kg)  03/14/15 171 lb 8 oz (77.792 kg)  03/06/15 167 lb 12.8 oz (76.114 kg)  02/27/15 173 lb 9.6 oz (78.744 kg)  02/07/15 174 lb (78.926 kg)  01/26/15 175 lb (79.379 kg)  01/26/15 184 lb (83.462 kg)  01/10/15 175 lb 12.8 oz (79.742 kg)  01/02/15 171 lb (77.565 kg)  12/27/14 172 lb 6.4 oz (78.2 kg)  12/26/14 188 lb 6.4 oz (85.458 kg)  07/19/14 179 lb (81.194 kg)  07/03/14 181 lb (82.101 kg)  05/10/14 186 lb 11.7 oz (84.7 kg)  05/03/14 180 lb (81.647 kg)  09/12/13 188 lb 6.4 oz (85.458 kg)  09/09/13 188 lb 6.4 oz (85.458 kg)  09/06/13 177 lb (80.287 kg)  09/01/13 177 lb (80.287 kg)  08/12/13 183 lb (83.008 kg)     Diet Order:     Skin:  Reviewed, no issues  Last BM:  8/17 diarrhea and constipation, c.diff negative  Height:   Ht Readings from Last 1 Encounters:   03/23/15 5\' 6"  (1.676 m)    Weight:   Wt Readings from Last 1 Encounters:  03/23/15 170 lb 10.2 oz (77.4 kg)    Ideal Body Weight:  61.3 kg  BMI:  Body mass index is 27.55 kg/(m^2).  Estimated Nutritional Needs:   Kcal:  1700-1900  Protein:  80-90 grams  Fluid:  > 1.7 L/day  EDUCATION NEEDS:   No education needs identified at this time  [place], [place], [place] Pager [phone] After Hours Pager

## 2015-03-23 NOTE — Progress Notes (Signed)
Patient refusing bed alarm this evening shift.  Re-educated patient on importance of utilizing bed alarm; patient still refused.  Non-skid socks on.  Bed in lowest position.  Emphasized use of call bell if needed to use the bathroom; verbalized understanding.  Will continue to monitor patient.

## 2015-03-23 NOTE — Progress Notes (Signed)
Patient ID: VEORA FONTE, female   DOB: 1931/06/14, 81 y.o.   MRN: [id number]         [facility]    Date of Admission:  03/23/2015   Ciprofloxacin 8/12-18/2016  Principal Problem:   FUO (fever of unknown origin) Active Problems:   Fever of unknown origin (FUO)   Anemia, chronic disease   Essential hypertension   Paroxysmal atrial fibrillation   Current use of long term anticoagulation   Malnutrition of moderate degree  SUBJECTIVE: Ms. Hernan is now admitted for further evaluation of her intermittent fever of unknown origin. She now tells me that she has not felt well for nearly 1 year. She recalls being sick over Thanksgiving and Christmas of last year. She states that she cannot even remember what she was sick with at that time but she feels like she has been and steady decline over the past 12 months. She started having intermittent fevers in May associated with chills and drenching sweats. Has had anorexia and unintentional weight loss. She has had multiple rounds of empiric anabiotic therapy. Last week she recalled that she had seemed to have some improvement when on 2 previous rounds of ciprofloxacin. She started having high fever, chills and sweats again 1 week ago and I had her restart ciprofloxacin but she has continued to have fever, chills and sweats. Recent blood cultures obtained before antibiotics were restarted are negative. Bronchoscopy a little over one month ago has been unrevealing. She has developed progressive anemia. Recent CT scans have shown increasing mediastinal and retroperitoneal adenopathy.  Review of Systems: Constitutional: positive for anorexia, chills, fevers, malaise, night sweats and weight loss Eyes: negative Ears, nose, mouth, throat, and face: negative Respiratory: positive for cough, dyspnea on exertion and sputum, negative for hemoptysis and pleurisy/chest pain Cardiovascular: negative Gastrointestinal: recent, brief bout  of diarrhea (C. difficile PCR negative) and now she is constipated Genitourinary:negative  Past Medical History  Diagnosis Date  . COPD (chronic obstructive pulmonary disease)     pft's 09/15/2005, FEV1 1.69 (76%) with ration67 and better x 14% p B2, DLC 95%  . Hypertension   . Bronchitis, chronic   . Celiac disease   . Anxiety   . Pneumonia     hx of x3  . Acute blood loss anemia 09/07/13  . Anxiety state, unspecified   . Bronchiectasis 2012  . Cerebrovascular disease, unspecified 05/26/13  . Cerebral atrophy 05/26/13  . Anemia   . Atrial fibrillation   . Aneurysm of aorta     Social History  Substance Use Topics  . Smoking status: Former Smoker -- 0.50 packs/day for 60 years    Types: Cigarettes    Quit date: 01/27/2012  . Smokeless tobacco: Never Used  . Alcohol Use: Yes     Comment: Wine and Gin daily    Family History  Problem Relation Age of Onset  . Heart disease Father 12    heart attack  . Prostate cancer Paternal Grandfather   . Pulmonary fibrosis Brother     former smoker   Allergies  Allergen Reactions  . Benicar [Olmesartan] Diarrhea and Other (See Comments)    Hypotension  . Gluten Meal   . Metronidazole Nausea Only  . Morphine Hives    OBJECTIVE: Filed Vitals:   03/23/15 1333  BP: 89/54  Pulse: 78  Temp: 97.7 F (36.5 C)  TempSrc: Oral  Resp: 18  Height: 5\' 6"  (1.676 m)  Weight: 170 lb 10.2 oz (77.4  kg)  SpO2: 97%   Body mass index is 27.55 kg/(m^2).  General: Her weight is down 17 pounds since May Skin: No rash but some bruising on her hands and arms Lymph nodes: No palpable adenopathy Eyes: Normal external exam Temporal arteries: Normal to palpation Lungs: Clear Cor: Regular S1 and S2 with no murmur Abdomen: Soft and nontender with no palpable masses Joints and extremities: No acute abnormalities Mood: Anxious and somewhat tearful  Lab Results Lab Results  Component Value Date   WBC 4.5 03/08/2015   HGB 8.9* 03/08/2015    HCT 29* 03/08/2015   MCV 88.8 02/23/2015   PLT 440* 03/08/2015    Lab Results  Component Value Date   CREATININE 0.8 03/06/2015   BUN 22* 03/06/2015   NA 137 03/06/2015   K 4.9 03/06/2015   CL 97* 02/23/2015   CO2 26 02/23/2015    Lab Results  Component Value Date   ALT 16 12/24/2014   AST 25 12/24/2014   ALKPHOS 50 12/24/2014   BILITOT 0.6 12/24/2014     Microbiology: Recent Results (from the past 240 hour(s))  Blood culture (routine single)     Status: None   Collection Time: 03/14/15  3:00 PM  Result Value Ref Range Status   Organism ID, Bacteria NO GROWTH 5 DAYS  Final  Blood culture (routine single)     Status: None   Collection Time: 03/14/15  3:16 PM  Result Value Ref Range Status   Organism ID, Bacteria NO GROWTH 5 DAYS  Final  Clostridium Difficile by PCR     Status: None   Collection Time: 03/21/15  2:37 PM  Result Value Ref Range Status   Toxigenic C Difficile by pcr Not Detected Not Detected Final    Comment: This test is for use only with liquid or soft stools; performance characteristics of other clinical specimen types have not been established.   This assay was performed by Cepheid GeneXpert(R) PCR. The performance characteristics of this assay have been determined by [employer]. Performance characteristics refer to the analytical performance of the test.    BAL AFB culture 02/20/2015: No growth so far  CT CHEST WITHOUT CONTRAST 11/27/2014  IMPRESSION: 1. Ascending thoracic aortic aneurysm measuring 4.6 cm in diameter, slightly increased from prior study (4.4 cm in diameter on prior). Recommend semi-annual imaging followup by CTA or MRA and referral to cardiothoracic surgery if not already obtained. This recommendation follows 2010 ACCF/AHA/AATS/ACR/ASA/SCA/SCAI/SIR/STS/SVM Guidelines for the Diagnosis and Management of Patients With Thoracic Aortic Disease. Circulation. 2010; 121: X902-I097. 2. Increasing mediastinal  lymphadenopathy, favored to be reactive. There is a spectrum of findings in the lungs since suggestive of a progressive chronic indolent atypical infectious process such as mycobacterium avium intracellulare (MAI). Clinical correlation is recommended. 3. Coronary artery atherosclerosis, including left main and 3 vessel coronary artery disease.  Electronically Signed  By: Vinnie Langton M.D.  On: 11/27/2014 15:12  CT ABDOMEN AND PELVIS WITH CONTRAST 03/19/2015  IMPRESSION: 1. No definite source for fever is confidently identified on today's examination. However, the distal aspect of the appendix appears mildly enlarged and thickened (10 mm in diameter), such that the possibility of chronic appendicitis should be considered. No overt inflammatory changes around the appendix at this time to suggest an acute appendicitis. 2. Multiple borderline enlarged upper abdominal and retroperitoneal lymph nodes, and a mildly enlarged right-sided juxta pericardiac lymph node. These findings are nonspecific, and the lower thoracic lymph node may be reactive related to chronic pulmonary infection,  which may also be a source for fevers and chills. 3. Colonic diverticulosis without evidence of acute diverticulitis at this time. 4. Additional incidental findings, as above.  Electronically Signed  By: Vinnie Langton M.D.  On: 03/19/2015 14:54  ASSESSMENT: So far I have not been able to find the cause of her fever of unknown origin, progressive anemia and weight loss. I will repeat a CBC, complete metabolic panel and chest x-ray today. I will also check a QuantiFERON assay. I've spoken to Dr. Lattie Haw and requested hematology/oncology evaluation given her anemia and adenopathy on CT scans. I prefer to observe off of antibiotics for now.  PLAN: 1. Observe off of antibiotics 2. Consider repeat blood cultures in 48-72 hours 3. Hematology/oncology consultation 4. CBC with  differential, complete metabolic panel and QuantiFERON assay 5. PA and lateral chest x-ray  Michel Bickers, [facility] [place] [phone] pager   [phone] cell 03/23/2015, 4:10 PM

## 2015-03-23 NOTE — Consult Note (Signed)
Referral MD  Reason for Referral: Fever of unknown origin   No chief complaint on file. : I keep having temperatures.  HPI: [name] [name] is a very charming 81 year old white female. She has multiple medical problems. She does have a history of celiac disease. She is a history of atrial fibrillation. She has some bronchiectasis.  She has had temperatures of up to 103 for several weeks.  She was admitted on the 19th. On admission, she is found be somewhat anemic. Her white cell count 4.7. Hemoglobin 8. Platelet count 327K. MCV is 82. She had a normal white blood cell differential although the monocytes were slightly elevated.  She had a CT of the abdomen and pelvis 4 days ago. This showed some mildly enlarged upper abdominal lymph nodes. Liver looked fine. Spleen looked fine.   She had a bronchoscopy done in July. So far, studies have all been negative regarding this.  She's been seen by infectious disease. They've not been able to identify any source for the fever.  She's had no rashes. She's had no foreign travel. There's been no change in medications.  She's had no weight loss or weight gain.  She's had no swollen lymph nodes.  We were kindly asked to see her to try to help out with the source of the fever.      Past Medical History  Diagnosis Date  . COPD (chronic obstructive pulmonary disease)     pft's 09/15/2005, FEV1 1.69 (76%) with ration67 and better x 14% p B2, DLC 95%  . Hypertension   . Bronchitis, chronic   . Celiac disease   . Anxiety   . Pneumonia     hx of x3  . Acute blood loss anemia 09/07/13  . Anxiety state, unspecified   . Bronchiectasis 2012  . Cerebrovascular disease, unspecified 05/26/13  . Cerebral atrophy 05/26/13  . Anemia   . Atrial fibrillation   . Aneurysm of aorta   :  Past Surgical History  Procedure Laterality Date  . Nasal sinus surgery  years ago  . Total knee arthroplasty Bilateral [id number]  . Excision morton's neuroma Bilateral  35 years ago, 2010  . Total hip arthroplasty Right 2009    Dr. Telford Nab  . Cataract extraction Bilateral ~8 years ago  . Total hip arthroplasty Left 09/06/2013    Dr. Salli Quarry  . Video bronchoscopy Bilateral 02/20/2015    Procedure: VIDEO BRONCHOSCOPY WITHOUT FLUORO;  Surgeon: Tanda Rockers, MD;  Location: [facility];  Service: Cardiopulmonary;  Laterality: Bilateral;  :   Current facility-administered medications:  .  0.9 %  sodium chloride infusion, , Intravenous, Continuous, Kelvin Cellar, MD, Last Rate: 75 mL/hr at 03/23/15 1640 .  acetaminophen (TYLENOL) tablet 650 mg, 650 mg, Oral, Q6H PRN **OR** acetaminophen (TYLENOL) suppository 650 mg, 650 mg, Rectal, Q6H PRN, Kelvin Cellar, MD .  alum & mag hydroxide-simeth (MAALOX/MYLANTA) 200-200-20 MG/5ML suspension 30 mL, 30 mL, Oral, Q6H PRN, Kelvin Cellar, MD .  apixaban (ELIQUIS) tablet 2.5 mg, 2.5 mg, Oral, BID, Kelvin Cellar, MD .  budesonide-formoterol (SYMBICORT) 160-4.5 MCG/ACT inhaler 2 puff, 2 puff, Inhalation, BID, Kelvin Cellar, MD .  Derrill Memo ON 03/24/2015] diltiazem (CARDIZEM CD) 24 hr capsule 120 mg, 120 mg, Oral, q morning - 10a, Kelvin Cellar, MD .  dofetilide (TIKOSYN) capsule 250 mcg, 250 mcg, Oral, BID, Kelvin Cellar, MD .  feeding supplement (ENSURE ENLIVE) (ENSURE ENLIVE) liquid 237 mL, 237 mL, Oral, BID BM, Kelvin Cellar, MD, 237 mL at 03/23/15 1630 .  [  START ON 03/24/2015] metoprolol succinate (TOPROL-XL) 24 hr tablet 50 mg, 50 mg, Oral, QPC breakfast, Kelvin Cellar, MD .  ondansetron (ZOFRAN) tablet 4 mg, 4 mg, Oral, Q6H PRN **OR** ondansetron (ZOFRAN) injection 4 mg, 4 mg, Intravenous, Q6H PRN, Kelvin Cellar, MD .  oxyCODONE (Oxy IR/ROXICODONE) immediate release tablet 5 mg, 5 mg, Oral, Q4H PRN, Kelvin Cellar, MD .  saccharomyces boulardii (FLORASTOR) capsule 250 mg, 250 mg, Oral, BID, Kelvin Cellar, MD .  sodium chloride 0.9 % injection 3 mL, 3 mL, Intravenous, Q12H, Kelvin Cellar, MD:  .  apixaban  2.5 mg Oral BID  . budesonide-formoterol  2 puff Inhalation BID  . [START ON 03/24/2015] diltiazem  120 mg Oral q morning - 10a  . dofetilide  250 mcg Oral BID  . feeding supplement (ENSURE ENLIVE)  237 mL Oral BID BM  . [START ON 03/24/2015] metoprolol succinate  50 mg Oral QPC breakfast  . saccharomyces boulardii  250 mg Oral BID  . sodium chloride  3 mL Intravenous Q12H  :  Allergies  Allergen Reactions  . Benicar [Olmesartan] Diarrhea and Other (See Comments)    Hypotension  . Gluten Meal   . Metronidazole Nausea Only  . Morphine Hives  :  Family History  Problem Relation Age of Onset  . Heart disease Father 93    heart attack  . Prostate cancer Paternal Grandfather   . Pulmonary fibrosis Brother     former smoker  :  Social History   Social History  . Marital Status: Widowed    Spouse Name: N/A  . Number of Children: N/A  . 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 Smoking status: Former Smoker -- 0.50 packs/day for 60 years    Types: Cigarettes    Quit date: 01/27/2012  . Smokeless tobacco: Never Used  . Alcohol Use: Yes     Comment: Wine and Gin daily  . Drug Use: No  . Sexual Activity: Not Currently   Other Topics Concern  . Not on file   Social History Narrative   Patient is Widowed. Retired at 81 years old. Lives in apartment,  Independent Living section at [place] since 05/2013.   Stopped smoking 2012, moderate alcohol intake    Patient has Advanced planning documents: Living Will                 :  Pertinent items are noted in HPI.  Exam: Patient Vitals for the past 24 hrs:  BP Temp Temp src Pulse Resp SpO2 Height Weight  03/23/15 1700 138/79 mmHg 98.1 F (36.7 C) Oral 81 18 96 % - -  03/23/15 1333 (!) 89/54 mmHg 97.7 F (36.5 C) Oral 78 18 97 % 5' 6"  (1.676 m) 170 lb 10.2 oz (77.4 kg)    elderly but well-nourished white female in no obvious  distress. Head and neck exam shows no ocular or oral lesions. There are no palpable cervical or supraclavicular lymph nodes. Lungs are clear bilaterally. Cardiac exam regular rate and rhythm with no murmurs, rubs or bruits. Abdomen is soft. She has good bowel sounds. There is no fluid wave. There is no palpable liver or spleen tip. Extremities shows some trace edema in her legs. Skin exam shows some scattered ecchymoses. Axillary exam shows no axillary adenopathy. Neurological exam shows no focal neurological deficits.    Recent Labs  03/23/15 1710  WBC 4.7  HGB  8.0*  HCT 25.2*  PLT 327   No results for input(s): NA, K, CL, CO2, GLUCOSE, BUN, CREATININE, CALCIUM in the last 72 hours.  Blood smear review:  None  Pathology: None     Assessment and Plan:  [name] [name] is an 81 year old white female. She has chronic fevers. No source is identified.  With the anemia, I would have to suspect that she will need a bone marrow biopsy. She has some borderline enlarged lymph nodes. Low-grade non-Hodgkin's lymphoma is always a possibility although temperatures typically her are not up to 103.  No blood has been made get. I will have to review one over the weekend.  I find nothing on her physical exam that would suggest a source for the temperatures.  I looked at her lab work so far, and nothing really is obvious.  I talked to her about doing a bone marrow biopsy. She is okay will with having this done. I will see if radiology can do one for [place] early next week.  She is very constipated. She says that the iron is doing this to her. I would get her off the oral iron. If she needs iron, I give it to her intravenously.  I don't see any medications that she is on that would really be implicated with temperatures.  We will follow her along. Hopefully, the bone marrow biopsy will give [place] an idea as to what might be going on.  I spent about 1 hour with her. We had a good prayer session. She is very  nice and has a very strong faith.  Pete E.

## 2015-03-24 ENCOUNTER — Encounter (HOSPITAL_COMMUNITY): Payer: Self-pay | Admitting: Radiology

## 2015-03-24 ENCOUNTER — Inpatient Hospital Stay (HOSPITAL_COMMUNITY): Payer: Medicare Other

## 2015-03-24 LAB — BASIC METABOLIC PANEL
Anion gap: 9 (ref 5–15)
BUN: 12 mg/dL (ref 6–20)
CO2: 28 mmol/L (ref 22–32)
CREATININE: 0.68 mg/dL (ref 0.44–1.00)
Calcium: 9 mg/dL (ref 8.9–10.3)
Chloride: 98 mmol/L — ABNORMAL LOW (ref 101–111)
GFR calc Af Amer: >60 mL/min (ref >60)
Glucose, Bld: 100 mg/dL — ABNORMAL HIGH (ref 65–99)
POTASSIUM: 4.1 mmol/L (ref 3.5–5.1)
SODIUM: 135 mmol/L (ref 135–145)

## 2015-03-24 LAB — CBC
HEMATOCRIT: 25 % — AB (ref 36.0–46.0)
Hemoglobin: 7.7 g/dL — ABNORMAL LOW (ref 12.0–15.0)
MCH: 25.7 pg — ABNORMAL LOW (ref 26.0–34.0)
MCHC: 30.8 g/dL (ref 30.0–36.0)
MCV: 83.3 fL (ref 78.0–100.0)
PLATELETS: 336 10*3/uL (ref 150–400)
RBC: 3 MIL/uL — ABNORMAL LOW (ref 3.87–5.11)
RDW: 16.3 % — AB (ref 11.5–15.5)
WBC: 3.9 10*3/uL — AB (ref 4.0–10.5)

## 2015-03-24 LAB — SAVE SMEAR

## 2015-03-24 LAB — RETICULOCYTES
RBC.: 3 MIL/uL — ABNORMAL LOW (ref 3.87–5.11)
RETIC CT PCT: 0.9 % (ref 0.4–3.1)
Retic Count, Absolute: 27 10*3/uL (ref 19.0–186.0)

## 2015-03-24 LAB — FERRITIN: Ferritin: 101 ng/mL (ref 11–307)

## 2015-03-24 LAB — LACTATE DEHYDROGENASE: LDH: 141 U/L (ref 98–192)

## 2015-03-24 MED ORDER — ALPRAZOLAM 0.5 MG PO TABS
0.5000 mg | ORAL_TABLET | Freq: Four times a day (QID) | ORAL | Status: DC | PRN
  Administered 2015-03-24 – 2015-03-29 (×8): 0.5 mg via ORAL
  Filled 2015-03-24 (×7): qty 1

## 2015-03-24 NOTE — H&P (Signed)
Chief Complaint: Patient was seen in consultation today for FUO and anemia at the request of Dr. Marin Olp  Referring Physician(s): Dr. Marin Olp  History of Present Illness: [name] [name] is a 81 y.o. female who presented for work-up of FUO with no found source and also noted to have anemia. Oncology has seen the patient and requested IR consult for image guided bone marrow biopsy. She denies any chest pain, shortness of breath or palpitations. She denies any active signs of bleeding or excessive bruising. The patient denies any history of sleep apnea or chronic oxygen use. She has previously tolerated sedation without complications.    Past Medical History  Diagnosis Date  . COPD (chronic obstructive pulmonary disease)     pft's 09/15/2005, FEV1 1.69 (76%) with ration67 and better x 14% p B2, DLC 95%  . Hypertension   . Bronchitis, chronic   . Celiac disease   . Anxiety   . Pneumonia     hx of x3  . Acute blood loss anemia 09/07/13  . Anxiety state, unspecified   . Bronchiectasis 2012  . Cerebrovascular disease, unspecified 05/26/13  . Cerebral atrophy 05/26/13  . Anemia   . Atrial fibrillation   . Aneurysm of aorta     Past Surgical History  Procedure Laterality Date  . Nasal sinus surgery  years ago  . Total knee arthroplasty Bilateral [id number]  . Excision morton's neuroma Bilateral 35 years ago, 2010  . Total hip arthroplasty Right 2009    Dr. Telford Nab  . Cataract extraction Bilateral ~8 years ago  . Total hip arthroplasty Left 09/06/2013    Dr. Salli Quarry  . Video bronchoscopy Bilateral 02/20/2015    Procedure: VIDEO BRONCHOSCOPY WITHOUT FLUORO;  Surgeon: Tanda Rockers, MD;  Location: [facility];  Service: Cardiopulmonary;  Laterality: Bilateral;    Allergies: Benicar; Gluten meal; Metronidazole; and Morphine  Medications: Prior to Admission medications   Medication Sig Start Date End Date Taking? Authorizing Provider  acetaminophen (TYLENOL) 650 MG CR tablet Take  1,300 mg by mouth every morning.     Historical Provider, MD  albuterol (PROVENTIL HFA;VENTOLIN HFA) 108 (90 BASE) MCG/ACT inhaler Inhale 1-2 puffs into the lungs every 6 (six) hours as needed for wheezing or shortness of breath. 08/14/14   Tanda Rockers, MD  ALPRAZolam Duanne Moron) 0.25 MG tablet Take 0.25 mg by mouth every 6 (six) hours as needed. 02/07/15   Historical Provider, MD  ALPRAZolam Duanne Moron) 0.5 MG tablet Take 1 tablet (0.5 mg total) by mouth every 6 (six) hours as needed for anxiety or sleep. 02/07/15   Tiffany L Reed, DO  BISACODYL PO Take by mouth at bedtime.    Historical Provider, MD  ciprofloxacin (CIPRO) 500 MG tablet Take 1 tablet (500 mg total) by mouth 2 (two) times daily. 03/16/15   Michel Bickers, MD  diltiazem (CARDIZEM CD) 120 MG 24 hr capsule Take 120 mg by mouth every morning.  05/05/14   Historical Provider, MD  diphenhydramine-acetaminophen (TYLENOL PM) 25-500 MG TABS Take 2 tablets by mouth at bedtime.    Historical Provider, MD  dofetilide (TIKOSYN) 250 MCG capsule Take 1 capsule (250 mcg total) by mouth 2 (two) times daily. 05/10/14   Jacolyn Reedy, MD  ELIQUIS 5 MG TABS tablet Take 2.5 mg by mouth 2 (two) times daily. 12/26/14   Historical Provider, MD  glucosamine-chondroitin 500-400 MG tablet Take 1 tablet by mouth 2 (two) times daily.      Historical Provider, MD  iron polysaccharides (NIFEREX) 150 MG capsule Take 150 mg by mouth. Take one tablet daily for iron    Historical Provider, MD  losartan (COZAAR) 100 MG tablet Take 50 mg by mouth every evening.     Historical Provider, MD  metoprolol succinate (TOPROL-XL) 50 MG 24 hr tablet Take 50 mg by mouth daily. Take with or immediately following a meal.    Historical Provider, MD  Multiple Vitamins-Minerals (MULTIVITAMIN ADULT) TABS Take by mouth. Take one tablet daily    Historical Provider, MD  Polyethyl Glycol-Propyl Glycol (SYSTANE OP) Place 1 drop into both eyes every morning.    Historical Provider, MD  polyethylene  glycol (MIRALAX / GLYCOLAX) packet Take 17 g by mouth daily.    Historical Provider, MD  Saccharomyces boulardii (FLORASTOR PO) Take by mouth. Probiotic take one twice daily    Historical Provider, MD  spironolactone (ALDACTONE) 25 MG tablet Take 12.5 mg by mouth daily.  12/26/14   Historical Provider, MD  SYMBICORT 160-4.5 MCG/ACT inhaler USE 2 PUFFS TWICE DAILY. 03/13/15   Gayland Curry, DO     Family History  Problem Relation Age of Onset  . Heart disease Father 40    heart attack  . Prostate cancer Paternal Grandfather   . Pulmonary fibrosis Brother     former smoker    Social History   Social History  . Marital Status: Widowed    Spouse Name: N/A  . Number of Children: N/A  . 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 Smoking status: Former Smoker -- 0.50 packs/day for 60 years    Types: Cigarettes    Quit date: 01/27/2012  . Smokeless tobacco: Never Used  . Alcohol Use: Yes     Comment: Wine and Gin daily  . Drug Use: No  . Sexual Activity: Not Currently   Other Topics Concern  . None   Social History Narrative   Patient is Widowed. Retired at 81 years old. Lives in apartment,  Independent Living section at [place] since 05/2013.   Stopped smoking 2012, moderate alcohol intake    Patient has Advanced planning documents: Living Will                   Review of Systems: A 12 point ROS discussed and pertinent positives are indicated in the HPI above.  All other systems are negative.  Review of Systems  Vital Signs: BP 148/79 mmHg  Pulse 78  Temp(Src) 98.3 F (36.8 C) (Oral)  Resp 17  Ht 5' 6"  (1.676 m)  Wt 170 lb 10.2 oz (77.4 kg)  BMI 27.55 kg/m2  SpO2 95%  Physical Exam  Constitutional: She is oriented to person, place, and time. No distress.  HENT:  Head: Normocephalic and atraumatic.  Cardiovascular: Normal rate and regular rhythm.  Exam reveals no gallop and no  friction rub.   No murmur heard. Pulmonary/Chest: Effort normal and breath sounds normal. No respiratory distress. She has no wheezes. She has no rales.  Abdominal: Soft. Bowel sounds are normal. She exhibits no distension. There is no tenderness.  Neurological: She is alert and oriented to person, place, and time.  Skin: Skin is warm and dry. She is not diaphoretic.    Mallampati Score:  MD Evaluation Airway: WNL Heart: WNL Abdomen: WNL Chest/ Lungs: WNL ASA  Classification: 2 Mallampati/Airway Score: Two  Imaging: Dg Chest 2 View  03/23/2015  CLINICAL DATA:  Fever for 1 day  EXAM: CHEST  2 VIEW  COMPARISON:  February 23, 2015  FINDINGS: The heart size and mediastinal contours are stable. There is a question 2 cm cavitary lesion in the right lung base. There is no focal pneumonia, pulmonary edema or pleural effusion. Visualized skeletal structures are stable.  IMPRESSION: Question 2 cm cavitary lesion in the right lung base. Further evaluation with a chest CT is recommended.   Electronically Signed   By: Abelardo Diesel M.D.   On: 03/23/2015 20:05   Dg Chest 2 View  02/23/2015   CLINICAL DATA:  Fever and weakness  EXAM: CHEST  2 VIEW  COMPARISON:  02/20/2015  FINDINGS: Heart size is normal. The aorta is unfolded and ectatic. Patchy bibasilar airspace opacities are stable. No pleural effusion. Left shoulder probable loose bodies. No acute osseous abnormality.  IMPRESSION: Stable patchy bilateral lower lobe airspace opacities. Review of prior exams dating back to April demonstrates chronicity of these findings, which may occur with atypical infectious organisms or scarring.   Electronically Signed   By: Conchita Paris M.D.   On: 02/23/2015 19:02   Ct Abdomen Pelvis W Contrast  03/19/2015   CLINICAL DATA:  81 year old female with fever of unknown origin for several months. Sweats and chills.  EXAM: CT ABDOMEN AND PELVIS WITH CONTRAST  TECHNIQUE: Multidetector CT imaging of the abdomen and pelvis  was performed using the standard protocol following bolus administration of intravenous contrast.  CONTRAST:  114m ISOVUE-300 IOPAMIDOL (ISOVUE-300) INJECTION 61%  COMPARISON:  No priors.  FINDINGS: Lower chest: Bronchial wall thickening with peribronchovascular micro and macronodularity and architectural distortion in the periphery of the right lower lobe, similar to prior examination 11/27/2014 presumably related to chronic infection such is MAI. Enlarged juxta pericardiac lymph node immediately adjacent to the right atrium measuring 15 mm in short axis, slightly enlarged compared to the prior examination.  Hepatobiliary: No cystic or solid hepatic lesions. No intra or extrahepatic biliary ductal dilatation. Gallbladder is normal in appearance.  Pancreas: No pancreatic mass. No pancreatic ductal dilatation. No pancreatic or peripancreatic fluid or inflammatory changes.  Spleen: Unremarkable.  Adrenals/Urinary Tract: Bilateral adrenal glands are normal in appearance. Sub cm low-attenuation lesions in the kidneys bilaterally too small to definitively characterize, but are statistically likely to represent small cysts. 1.8 cm well-defined low-attenuation lesion in the lower pole of the left kidney is compatible with a simple cyst. No hydroureteronephrosis to indicate urinary tract obstruction at this time. The urinary bladder is nearly completely obscured by extensive beam hardening artifact from the patient's bilateral total hip arthroplasties.  Stomach/Bowel: The appearance of the stomach is normal. No pathologic dilatation of small bowel or colon. Numerous colonic diverticulae are noted. In the well visualized portions of the abdomen and pelvis (much of the pelvis is obscured by beam hardening artifact) there are no surrounding inflammatory changes to suggest an acute diverticulitis at this time. While the proximal aspect of the appendix is normal in appearance, the distal aspect appears mildly dilated and  thickened, measuring up to 10 mm in diameter. No overt surrounding inflammatory changes are noted.  Vascular/Lymphatic: Atherosclerosis throughout the abdominal and pelvic vasculature, without evidence of aneurysm or dissection. Multiple borderline enlarged retroperitoneal and upper abdominal lymph nodes are noted measuring up to 8 mm in short axis, but are nonspecific.  Reproductive: Much of the pelvis is obscured by beam hardening artifacts, accurate evaluation of uterus and ovaries is not possible on today's examination.  Other:  No significant volume of ascites.  No pneumoperitoneum.  Musculoskeletal: Chronic appearing burst fracture of L3 with approximately 70% loss of central vertebral body height and 8 mm are retropulsion of fracture fragments impinging upon the central spinal canal. 7 mm of anterolisthesis of L4 upon L5. Status post bilateral total hip arthroplasty. There are no aggressive appearing lytic or blastic lesions noted in the visualized portions of the skeleton.  IMPRESSION: 1. No definite source for fever is confidently identified on today's examination. However, the distal aspect of the appendix appears mildly enlarged and thickened (10 mm in diameter), such that the possibility of chronic appendicitis should be considered. No overt inflammatory changes around the appendix at this time to suggest an acute appendicitis. 2. Multiple borderline enlarged upper abdominal and retroperitoneal lymph nodes, and a mildly enlarged right-sided juxta pericardiac lymph node. These findings are nonspecific, and the lower thoracic lymph node may be reactive related to chronic pulmonary infection, which may also be a source for fevers and chills. 3. Colonic diverticulosis without evidence of acute diverticulitis at this time. 4. Additional incidental findings, as above.   Electronically Signed   By: Vinnie Langton M.D.   On: 03/19/2015 14:54    Labs:  CBC:  Recent Labs  02/23/15 1843 03/08/15  03/23/15 1710 03/24/15 0450  WBC 7.0 4.5 4.7 3.9*  HGB 9.5* 8.9* 8.0* 7.7*  HCT 31.0* 29* 25.2* 25.0*  PLT 283 440* 327 336    COAGS: No results for input(s): INR, APTT in the last 8760 hours.  BMP:  Recent Labs  12/24/14 1210  02/23/15 1843 03/06/15 03/23/15 1710 03/24/15 0450  NA 132*  < > 132* 137 133* 135  K 4.7  < > 3.9 4.9 4.1 4.1  CL 94*  --  97*  --  96* 98*  CO2 27  --  26  --  27 28  GLUCOSE 122*  --  103*  --  120* 100*  BUN 19  < > 17 22* 15 12  CALCIUM 9.0  --  8.9  --  9.6 9.0  CREATININE 0.77  < > 0.65 0.8 0.82 0.68  GFRNONAA >60  --  >60  --  >60 >60  GFRAA >60  --  >60  --  >60 >60  < > = values in this interval not displayed.  LIVER FUNCTION TESTS:  Recent Labs  07/18/14 2357 07/19/14 0400 12/24/14 1210 03/23/15 1710  BILITOT 0.4 0.5 0.6 0.3  AST 19 19 25  39  ALT 11 11 16  33  ALKPHOS 54 57 50 58  PROT 6.8 6.7 7.4 6.6  ALBUMIN 3.4* 3.3* 3.3* 2.7*    Assessment and Plan: Fever of unknown origin- ID following  Anemia Seen by Oncology Request for image guided bone marrow biopsy The patient will be NPO, on eliquis, labs and vitals have been reviewed. Risks and Benefits discussed with the patient including, but not limited to bleeding, infection, damage to adjacent structures or low yield requiring additional tests. All of the patient's questions were answered, patient is agreeable to proceed. Consent signed and in chart. Atrial fibrillation on Tikosyn and Eliquis    Thank you for this interesting consult.  I greatly enjoyed meeting [name] [name] and look forward to participating in their care.  A copy of this report was sent to the requesting provider on this date.  SignedHedy Jacob 03/24/2015, 12:25 PM   I spent a total of 20 Minutes in face to face in clinical consultation,  greater than 50% of which was counseling/coordinating care for anemia and FUO

## 2015-03-24 NOTE — Progress Notes (Signed)
[facility] PROGRESS NOTE  LAKEISHIA TRULUCK GUY:[id number] DOB: 1931/01/11 DOA: 03/23/2015 PCP: Hollace Kinnier, DO  Assessment/Plan:  Principal Problem:   FUO (fever of unknown origin) - Currently undergoing work up by [employer] and ID - chest x ray obtained and reporting cavitary lesion which should be evaluated by CT scan for further evaluation. CT chest w/o contrast order placed by ID.  Active Problems:   Essential hypertension - Cardizem and metoprolol    Paroxysmal atrial fibrillation - continue eliquis - Metoprolol and cardizem    Anemia, chronic disease - hgb level stable    Malnutrition of moderate degree - Pt on Ensure  Code Status: full Family Communication:  Discussed with family at bedside Disposition Plan: Pending improvement in condition.   Consultants:  Oncology  ID  Procedures:  None  Antibiotics:  None  HPI/Subjective: Pt has no new complaints. No acute issues overnight.  Objective: Filed Vitals:   03/24/15 0935  BP: 148/79  Pulse: 78  Temp: 98.3 F (36.8 C)  Resp: 17    Intake/Output Summary (Last 24 hours) at 03/24/15 1535 Last data filed at 03/24/15 1256  Gross per 24 hour  Intake   2320 ml  Output      0 ml  Net   2320 ml   Filed Weights   03/23/15 1333  Weight: 77.4 kg (170 lb 10.2 oz)    Exam:   General:  Pt in nad, alert and awake  Cardiovascular: s1 and s2 wnl, no mrg  Respiratory: CTA BL, no wheezes  Abdomen: cta bl, no wheezes  Musculoskeletal: soft, ND, NT   Data Reviewed: Basic Metabolic Panel:  Recent Labs Lab 03/23/15 1710 03/24/15 0450  NA 133* 135  K 4.1 4.1  CL 96* 98*  CO2 27 28  GLUCOSE 120* 100*  BUN 15 12  CREATININE 0.82 0.68  CALCIUM 9.6 9.0   Liver Function Tests:  Recent Labs Lab 03/23/15 1710  AST 39  ALT 33  ALKPHOS 58  BILITOT 0.3  PROT 6.6  ALBUMIN 2.7*   No results for input(s): LIPASE, AMYLASE in the last 168 hours. No results for input(s): AMMONIA in the  last 168 hours. CBC:  Recent Labs Lab 03/23/15 1710 03/24/15 0450  WBC 4.7 3.9*  NEUTROABS 3.2  --   HGB 8.0* 7.7*  HCT 25.2* 25.0*  MCV 82.4 83.3  PLT 327 336   Cardiac Enzymes: No results for input(s): CKTOTAL, CKMB, CKMBINDEX, TROPONINI in the last 168 hours. BNP (last 3 results) No results for input(s): BNP in the last 8760 hours.  ProBNP (last 3 results)  Recent Labs  07/18/14 2357  PROBNP 386.2    CBG: No results for input(s): GLUCAP in the last 168 hours.  Recent Results (from the past 240 hour(s))  Clostridium Difficile by PCR     Status: None   Collection Time: 03/21/15  2:37 PM  Result Value Ref Range Status   Toxigenic C Difficile by pcr Not Detected Not Detected Final    Comment: This test is for use only with liquid or soft stools; performance characteristics of other clinical specimen types have not been established.   This assay was performed by Cepheid GeneXpert(R) PCR. The performance characteristics of this assay have been determined by [employer]. Performance characteristics refer to the analytical performance of the test.      Studies: Dg Chest 2 View  03/23/2015   CLINICAL DATA:  Fever for 1 day  EXAM: CHEST  2 VIEW  COMPARISON:  February 23, 2015  FINDINGS: The heart size and mediastinal contours are stable. There is a question 2 cm cavitary lesion in the right lung base. There is no focal pneumonia, pulmonary edema or pleural effusion. Visualized skeletal structures are stable.  IMPRESSION: Question 2 cm cavitary lesion in the right lung base. Further evaluation with a chest CT is recommended.   Electronically Signed   By: Abelardo Diesel M.D.   On: 03/23/2015 20:05    Scheduled Meds: . ALPRAZolam  0.5 mg Oral Once  . apixaban  2.5 mg Oral BID  . budesonide-formoterol  2 puff Inhalation BID  . diltiazem  120 mg Oral q morning - 10a  . dofetilide  250 mcg Oral BID  . feeding supplement (ENSURE ENLIVE)  237 mL Oral BID BM  .  metoprolol succinate  50 mg Oral QHS  . saccharomyces boulardii  250 mg Oral BID  . sodium chloride  3 mL Intravenous Q12H   Continuous Infusions: . sodium chloride 75 mL/hr at 03/24/15 0530    Time spent: > 35 minutes    Velvet Bathe  [facility] Pager [phone]. If 7PM-7AM, please contact night-coverage at [email or web address], password [facility] 03/24/2015, 3:35 PM  LOS: 1 day

## 2015-03-24 NOTE — Progress Notes (Signed)
Patient ID: BRISEYDA FEHR, female   DOB: [birth year], 81 y.o.   MRN: [id number]         [facility]    Date of Admission:  03/23/2015     Principal Problem:   FUO (fever of unknown origin) Active Problems:   Fever of unknown origin (FUO)   Anemia, chronic disease   Essential hypertension   Paroxysmal atrial fibrillation   Current use of long term anticoagulation   Malnutrition of moderate degree   . ALPRAZolam  0.5 mg Oral Once  . apixaban  2.5 mg Oral BID  . budesonide-formoterol  2 puff Inhalation BID  . diltiazem  120 mg Oral q morning - 10a  . dofetilide  250 mcg Oral BID  . feeding supplement (ENSURE ENLIVE)  237 mL Oral BID BM  . metoprolol succinate  50 mg Oral QHS  . saccharomyces boulardii  250 mg Oral BID  . sodium chloride  3 mL Intravenous Q12H    SUBJECTIVE: She tells me that she preemptively packed eyes behind her neck and under both arms last night to try to prevent fever. She had mild night sweats but no documented fever.  Review of Systems: Pertinent items are noted in HPI.  Past Medical History  Diagnosis Date  . COPD (chronic obstructive pulmonary disease)     pft's 09/15/2005, FEV1 1.69 (76%) with ration67 and better x 14% p B2, DLC 95%  . Hypertension   . Bronchitis, chronic   . Celiac disease   . Anxiety   . Pneumonia     hx of x3  . Acute blood loss anemia 09/07/13  . Anxiety state, unspecified   . Bronchiectasis 2012  . Cerebrovascular disease, unspecified 05/26/13  . Cerebral atrophy 05/26/13  . Anemia   . Atrial fibrillation   . Aneurysm of aorta     Social History  Substance Use Topics  . Smoking status: Former Smoker -- 0.50 packs/day for 60 years    Types: Cigarettes    Quit date: 01/27/2012  . Smokeless tobacco: Never Used  . Alcohol Use: Yes     Comment: Wine and Gin daily    Family History  Problem Relation Age of Onset  . Heart disease Father 49    heart attack  . Prostate cancer Paternal  Grandfather   . Pulmonary fibrosis Brother     former smoker   Allergies  Allergen Reactions  . Benicar [Olmesartan] Diarrhea and Other (See Comments)    Hypotension  . Gluten Meal   . Metronidazole Nausea Only  . Morphine Hives    OBJECTIVE: Filed Vitals:   03/23/15 1700 03/23/15 2047 03/24/15 0810 03/24/15 0935  BP: 138/79 128/84 176/96 148/79  Pulse: 81 83 85 78  Temp: 98.1 F (36.7 C) 97.8 F (36.6 C) 97.6 F (36.4 C) 98.3 F (36.8 C)  TempSrc: Oral Oral Oral Oral  Resp: 18 18 18 17   Height:      Weight:      SpO2: 96% 94% 95% 95%   Body mass index is 27.55 kg/(m^2).  General: she is alert and in no distress Skin: some bruising but no rash Lymph nodes: No palpable adenopathy Temporal arteries: Normal to palpation Lungs: clear Cor: regular S1 and S2 with no murmur Abdomen: soft and nontender. She remains constipated.  Lab Results Lab Results  Component Value Date   WBC 3.9* 03/24/2015   HGB 7.7* 03/24/2015   HCT 25.0* 03/24/2015   MCV 83.3 03/24/2015  PLT 336 03/24/2015    Lab Results  Component Value Date   CREATININE 0.68 03/24/2015   BUN 12 03/24/2015   NA 135 03/24/2015   K 4.1 03/24/2015   CL 98* 03/24/2015   CO2 28 03/24/2015    Lab Results  Component Value Date   ALT 33 03/23/2015   AST 39 03/23/2015   ALKPHOS 58 03/23/2015   BILITOT 0.3 03/23/2015    SED RATE  Date Value  03/23/2015 108 mm/hr*  03/08/2015 51 mm   CRP (mg/dL)  Date Value  03/23/2015 19.3*   Microbiology: Recent Results (from the past 240 hour(s))  Blood culture (routine single)     Status: None   Collection Time: 03/14/15  3:00 PM  Result Value Ref Range Status   Organism ID, Bacteria NO GROWTH 5 DAYS  Final  Blood culture (routine single)     Status: None   Collection Time: 03/14/15  3:16 PM  Result Value Ref Range Status   Organism ID, Bacteria NO GROWTH 5 DAYS  Final  Clostridium Difficile by PCR     Status: None   Collection Time: 03/21/15  2:37 PM    Result Value Ref Range Status   Toxigenic C Difficile by pcr Not Detected Not Detected Final    Comment: This test is for use only with liquid or soft stools; performance characteristics of other clinical specimen types have not been established.   This assay was performed by Cepheid GeneXpert(R) PCR. The performance characteristics of this assay have been determined by [employer]. Performance characteristics refer to the analytical performance of the test.    CHEST 2 VIEW 03/23/2015  COMPARISON: February 23, 2015  FINDINGS: The heart size and mediastinal contours are stable. There is a question 2 cm cavitary lesion in the right lung base. There is no focal pneumonia, pulmonary edema or pleural effusion. Visualized skeletal structures are stable.  IMPRESSION: Question 2 cm cavitary lesion in the right lung base. Further evaluation with a chest CT is recommended.   Electronically Signed  By: Abelardo Diesel M.D.  On: 03/23/2015 20:05  ASSESSMENT: She's not had documented fever for 48 hours suggesting that she may have been starting to respond to empiric ciprofloxacin which has been stopped. I will continue observation off of antibiotics. Her hemoglobin has drifted down some more to 7.7 g. Her chest x-ray suggests the possibility of a new cavitary lesion in the right lung base. I'll repeat her chest CT. Hopefully we can get a bone marrow examination early next week.  PLAN: 1. Observe off of antibiotics 2. Chest CT scan 3. Bone marrow examination  Michel Bickers, MD [facility] [place] [facility] [phone] pager   [phone] cell 03/24/2015, 2:36 PM

## 2015-03-25 DIAGNOSIS — R918 Other nonspecific abnormal finding of lung field: Secondary | ICD-10-CM

## 2015-03-25 NOTE — Discharge Instructions (Signed)

## 2015-03-25 NOTE — Progress Notes (Signed)
Patient ID: [name] [name], female   DOB: [birth year], 81 y.o.   MRN: [id number]         [facility] for Infectious Disease    Date of Admission:  03/23/2015     Principal Problem:   FUO (fever of unknown origin) Active Problems:   Fever of unknown origin (FUO)   Anemia, chronic disease   Essential hypertension   Paroxysmal atrial fibrillation   Current use of long term anticoagulation   Malnutrition of moderate degree   . ALPRAZolam  0.5 mg Oral Once  . apixaban  2.5 mg Oral BID  . budesonide-formoterol  2 puff Inhalation BID  . diltiazem  120 mg Oral q morning - 10a  . dofetilide  250 mcg Oral BID  . feeding supplement (ENSURE ENLIVE)  237 mL Oral BID BM  . metoprolol succinate  50 mg Oral QHS  . saccharomyces boulardii  250 mg Oral BID  . sodium chloride  3 mL Intravenous Q12H    SUBJECTIVE: She did not have any fever, chills or sweats last night and is feeling better today. She's had minimal dry cough.  Review of Systems: Pertinent items are noted in HPI.  Past Medical History  Diagnosis Date  . COPD (chronic obstructive pulmonary disease)     pft's 09/15/2005, FEV1 1.69 (76%) with ration67 and better x 14% p B2, DLC 95%  . Hypertension   . Bronchitis, chronic   . Celiac disease   . Anxiety   . Pneumonia     hx of x3  . Acute blood loss anemia 09/07/13  . Anxiety state, unspecified   . Bronchiectasis 2012  . Cerebrovascular disease, unspecified 05/26/13  . Cerebral atrophy 05/26/13  . Anemia   . Atrial fibrillation   . Aneurysm of aorta     Social History  Substance Use Topics  . Smoking status: Former Smoker -- 0.50 packs/day for 60 years    Types: Cigarettes    Quit date: 01/27/2012  . Smokeless tobacco: Never Used  . Alcohol Use: Yes     Comment: Wine and Gin daily    Family History  Problem Relation Age of Onset  . Heart disease Father 42    heart attack  . Prostate cancer Paternal Grandfather   . Pulmonary fibrosis Brother     former  smoker   Allergies  Allergen Reactions  . Benicar [Olmesartan] Diarrhea and Other (See Comments)    Hypotension  . Gluten Meal Diarrhea    Intolerance  . Metronidazole Nausea Only  . Morphine Hives  . Latex Other (See Comments)    Red and irritated    OBJECTIVE: Filed Vitals:   03/24/15 1709 03/24/15 2133 03/25/15 0407 03/25/15 0812  BP: 161/81 126/72 136/89 152/82  Pulse: 95 90 74 73  Temp: 99 F (37.2 C) 98.4 F (36.9 C) 98.4 F (36.9 C) 98.4 F (36.9 C)  TempSrc: Oral Oral Oral Oral  Resp: 18 20 17 16   Height:  5\' 6"  (1.676 m)    Weight:  165 lb 5.5 oz (75 kg)    SpO2: 95% 95% 95% 94%   Body mass index is 26.7 kg/(m^2).  General: she is smiling and in good spirits eating lunch Skin: no rash Lymph nodes: She now has palpable left axillary adenopathy Lungs: clear Cor: regular S1 and S2 with no murmurs Abdomen: nontender without palpable masses   Lab Results Lab Results  Component Value Date   WBC 3.9* 03/24/2015   HGB 7.7*  03/24/2015   HCT 25.0* 03/24/2015   MCV 83.3 03/24/2015   PLT 336 03/24/2015    Lab Results  Component Value Date   CREATININE 0.68 03/24/2015   BUN 12 03/24/2015   NA 135 03/24/2015   K 4.1 03/24/2015   CL 98* 03/24/2015   CO2 28 03/24/2015    Lab Results  Component Value Date   ALT 33 03/23/2015   AST 39 03/23/2015   ALKPHOS 58 03/23/2015   BILITOT 0.3 03/23/2015   SED RATE  Date Value  03/23/2015 108 mm/hr*  03/08/2015 51 mm   CRP (mg/dL)  Date Value  03/23/2015 19.3*     Microbiology: Recent Results (from the past 240 hour(s))  Clostridium Difficile by PCR     Status: None   Collection Time: 03/21/15  2:37 PM  Result Value Ref Range Status   Toxigenic C Difficile by pcr Not Detected Not Detected Final    Comment: This test is for use only with liquid or soft stools; performance characteristics of other clinical specimen types have not been established.   This assay was performed by Cepheid GeneXpert(R)  PCR. The performance characteristics of this assay have been determined by [employer]. Performance characteristics refer to the analytical performance of the test.    BAL cultures 02/20/2015: All are nondiagnostic/negative at nearly 5 weeks  CT CHEST WITHOUT CONTRAST 03/24/2015  IMPRESSION: 1. Worsening airspace opacities in the left upper lobe, now conglomerate and masslike, since the most recent prior CT from April, 2016. Opacity has been present in this region dating back to October, 2014. While this might represent an acute pneumonia, adenocarcinoma (previously known as bronchoalveolar cell carcinoma) can have this appearance, particularly given the presence over an approximate 2 year interval. 2. Pathologic lymphadenopathy involving the mediastinum, left axilla, and left supraclavicular region with enlarging lymph nodes in the visualized upper abdomen since the April, 2016 CT. Metastatic disease or lymphoma/leukemia is suspected. 3. No evidence of cavitary lesion in the right base as questioned on yesterday's chest x-ray. A bleb or air cyst in the right lower lobe likely accounts for the chest x-ray finding. 4. Interval improvement in the pleuroparenchymal scar in the right lower lobe since the April, 2016 CT. 5. Scar and bronchiectasis is also present in the right middle lobe and lingula, unchanged.   Electronically Signed  By: Evangeline Dakin M.D.  On: 03/24/2015 21:23  ASSESSMENT: We are beginning to get some new clues as to what might be causing her intermittent fevers of unknown origin. Her repeat chest CT scan shows an enlarging masslike left upper lobe infiltrate. She's had some changes there at least since 2014 but has had definite progression since her last scan in April of this year. She also has enlarging mediastinal, left clavicular and left axillary adenopathy. I talked to her about possible etiologies including lung cancer, lymphoma, and  smoldering infection. I suspect she will need a tissue biopsy for diagnosis. She is scheduled for bone marrow examination tomorrow. If that does not reveal the cause I would consider PET scan and left axillary lymph node biopsy.  PLAN: 1. Observe off of antibiotics 2. Bone marrow examination tomorrow  Michel Bickers, MD [place] [facility] [place] [facility] [phone] pager   [phone] cell 03/25/2015, 1:41 PM

## 2015-03-25 NOTE — Progress Notes (Signed)
[facility] PROGRESS NOTE  [name] [name] PNT:[id number] DOB: November 15, 1930 DOA: 03/23/2015 PCP: Hollace Kinnier, DO  Assessment/Plan:  Principal Problem:   FUO (fever of unknown origin) - Currently undergoing work up by [employer] and ID - chest x ray obtained and reporting cavitary lesion which was not seen on CT scan. - CT chest w/o contrast and reported results reviewed.  Active Problems:   Essential hypertension - Cardizem and metoprolol    Paroxysmal atrial fibrillation - continue eliquis - Metoprolol and cardizem    Anemia, chronic disease - hgb level stable    Malnutrition of moderate degree - Pt on Ensure  Code Status: full Family Communication:  Discussed with family at bedside Disposition Plan: Pending recommendations from specialist.   Consultants:  Oncology  ID  Procedures:  None  Antibiotics:  None  HPI/Subjective: Pt has no new complaints. No acute issues overnight.  Objective: Filed Vitals:   03/25/15 0812  BP: 152/82  Pulse: 73  Temp: 98.4 F (36.9 C)  Resp: 16    Intake/Output Summary (Last 24 hours) at 03/25/15 1057 Last data filed at 03/25/15 1000  Gross per 24 hour  Intake   2880 ml  Output      0 ml  Net   2880 ml   Filed Weights   03/23/15 1333 03/24/15 2133  Weight: 77.4 kg (170 lb 10.2 oz) 75 kg (165 lb 5.5 oz)    Exam:   General:  Pt in nad, alert and awake  Cardiovascular: s1 and s2 wnl, no mrg  Respiratory: CTA BL, no wheezes  Abdomen: cta bl, no wheezes  Musculoskeletal: soft, ND, NT   Data Reviewed: Basic Metabolic Panel:  Recent Labs Lab 03/23/15 1710 03/24/15 0450  NA 133* 135  K 4.1 4.1  CL 96* 98*  CO2 27 28  GLUCOSE 120* 100*  BUN 15 12  CREATININE 0.82 0.68  CALCIUM 9.6 9.0   Liver Function Tests:  Recent Labs Lab 03/23/15 1710  AST 39  ALT 33  ALKPHOS 58  BILITOT 0.3  PROT 6.6  ALBUMIN 2.7*   No results for input(s): LIPASE, AMYLASE in the last 168 hours. No results  for input(s): AMMONIA in the last 168 hours. CBC:  Recent Labs Lab 03/23/15 1710 03/24/15 0450  WBC 4.7 3.9*  NEUTROABS 3.2  --   HGB 8.0* 7.7*  HCT 25.2* 25.0*  MCV 82.4 83.3  PLT 327 336   Cardiac Enzymes: No results for input(s): CKTOTAL, CKMB, CKMBINDEX, TROPONINI in the last 168 hours. BNP (last 3 results) No results for input(s): BNP in the last 8760 hours.  ProBNP (last 3 results)  Recent Labs  07/18/14 2357  PROBNP 386.2    CBG: No results for input(s): GLUCAP in the last 168 hours.  Recent Results (from the past 240 hour(s))  Clostridium Difficile by PCR     Status: None   Collection Time: 03/21/15  2:37 PM  Result Value Ref Range Status   Toxigenic C Difficile by pcr Not Detected Not Detected Final    Comment: This test is for use only with liquid or soft stools; performance characteristics of other clinical specimen types have not been established.   This assay was performed by Cepheid GeneXpert(R) PCR. The performance characteristics of this assay have been determined by [employer]. Performance characteristics refer to the analytical performance of the test.      Studies: Dg Chest 2 View  03/23/2015   CLINICAL DATA:  Fever for 1 day  EXAM: CHEST  2 VIEW  COMPARISON:  February 23, 2015  FINDINGS: The heart size and mediastinal contours are stable. There is a question 2 cm cavitary lesion in the right lung base. There is no focal pneumonia, pulmonary edema or pleural effusion. Visualized skeletal structures are stable.  IMPRESSION: Question 2 cm cavitary lesion in the right lung base. Further evaluation with a chest CT is recommended.   Electronically Signed   By: Abelardo Diesel M.D.   On: 03/23/2015 20:05   Ct Chest Wo Contrast  03/24/2015   CLINICAL DATA:  Possible cavitary lesion in the right lung base on chest x-ray earlier same date. Fever of unknown origin.  EXAM: CT CHEST WITHOUT CONTRAST  TECHNIQUE: Multidetector CT imaging of the chest was  performed following the standard protocol without IV contrast.  COMPARISON:  Two-view chest x-ray yesterday and earlier. CT chest 11/27/2014, 05/08/2014, 05/30/2013.  FINDINGS: Lungs: Since the most recent prior CT, worsening of airspace opacities in the peripheral left upper lobe, some of the opacities now conglomerate and masslike in appearance. Interval improvement in the airspace opacities in the peripheral right lower lobe, associated with bronchiectasis and several mucous filled dilated bronchi, associated with pleuroparenchymal scarring. Stable scar and bronchiectasis in the right middle lobe and lingula. No evidence of a right base cavitary lesion as questioned on yesterday's chest x-ray. A bleb or air cyst in the right lower lobe likely accounts for the chest x-ray finding.  Trachea/bronchi: Central airways patent with mild central bronchial wall thickening. Bronchiectasis in the right lower lobe, right middle lobe and lingula as noted above.  Pleura: Pleural thickening involving the right posterior and lateral hemithorax, some what improved when compared to the most recent prior CT. No pleural thickening on the left. No pleural effusions.  Mediastinum:  No mediastinal masses.  Heart and Vascular: Normal heart size. Moderate 3 vessel coronary atherosclerosis. No pericardial effusion. Ascending thoracic aortic aneurysm with maximum diameter approximately 4.8 cm, unchanged since the most recent CT in April, 2016, only slightly increased since the October, 2014 examination where it measured 4.5 cm.  Lymphatic: Interval marked progression of mediastinal lymphadenopathy since the April, 2016 CT. Enlarged nodes are present in stations 2R, 3A, 4R, 5 and 7. Index low right paratracheal (4) nodes measure 3.0 x 2.4 cm. Index AP window (station 5) nodes measure 3.5 x 1.7 cm. Index subcarinal (station 7) nodes measure 1.7 x 3.6 cm. Index left upper mediastinal (station 3A) nodes measure 2.9 x 2.4 cm. Right paracardiac  lymph node measures approximately 1.5 x 1.3 cm. Enlarged left axillary lymph nodes, an index node measuring 1.6 x 3.2 cm.  Other findings: None.  Visualized lower neck: Thyroid gland unremarkable. Enlarged left supraclavicular lymph nodes.  Visualized upper abdomen: Mildly enlarged retrocrural lymph nodes, increased in size since the most recent prior CT. Small gastrohepatic ligament lymph nodes, new. Visualized upper abdomen otherwise unremarkable.  Musculoskeletal: Osseous demineralization. Multilevel degenerative disc disease and spondylosis. No evidence of osseous metastatic disease.  IMPRESSION: 1. Worsening airspace opacities in the left upper lobe, now conglomerate and masslike, since the most recent prior CT from April, 2016. Opacity has been present in this region dating back to October, 2014. While this might represent an acute pneumonia, adenocarcinoma (previously known as bronchoalveolar cell carcinoma) can have this appearance, particularly given the presence over an approximate 2 year interval. 2. Pathologic lymphadenopathy involving the mediastinum, left axilla, and left supraclavicular region with enlarging lymph nodes in the visualized upper abdomen since the  April, 2016 CT. Metastatic disease or lymphoma/leukemia is suspected. 3. No evidence of cavitary lesion in the right base as questioned on yesterday's chest x-ray. A bleb or air cyst in the right lower lobe likely accounts for the chest x-ray finding. 4. Interval improvement in the pleuroparenchymal scar in the right lower lobe since the April, 2016 CT. 5. Scar and bronchiectasis is also present in the right middle lobe and lingula, unchanged.   Electronically Signed   By: Evangeline Dakin M.D.   On: 03/24/2015 21:23    Scheduled Meds: . ALPRAZolam  0.5 mg Oral Once  . apixaban  2.5 mg Oral BID  . budesonide-formoterol  2 puff Inhalation BID  . diltiazem  120 mg Oral q morning - 10a  . dofetilide  250 mcg Oral BID  . feeding supplement  (ENSURE ENLIVE)  237 mL Oral BID BM  . metoprolol succinate  50 mg Oral QHS  . saccharomyces boulardii  250 mg Oral BID  . sodium chloride  3 mL Intravenous Q12H   Continuous Infusions: . sodium chloride 75 mL/hr at 03/24/15 1810    Time spent: > 35 minutes    Velvet Bathe  [facility] Pager [phone]. If 7PM-7AM, please contact night-coverage at [email or web address], password [facility] 03/25/2015, 10:57 AM  LOS: 2 days

## 2015-03-26 ENCOUNTER — Inpatient Hospital Stay (HOSPITAL_COMMUNITY): Payer: Medicare Other

## 2015-03-26 DIAGNOSIS — R700 Elevated erythrocyte sedimentation rate: Secondary | ICD-10-CM

## 2015-03-26 DIAGNOSIS — R599 Enlarged lymph nodes, unspecified: Secondary | ICD-10-CM

## 2015-03-26 DIAGNOSIS — Z9889 Other specified postprocedural states: Secondary | ICD-10-CM

## 2015-03-26 DIAGNOSIS — D509 Iron deficiency anemia, unspecified: Secondary | ICD-10-CM

## 2015-03-26 LAB — CBC
HCT: 28.4 % — ABNORMAL LOW (ref 36.0–46.0)
Hemoglobin: 8.5 g/dL — ABNORMAL LOW (ref 12.0–15.0)
MCH: 25.4 pg — ABNORMAL LOW (ref 26.0–34.0)
MCHC: 29.9 g/dL — ABNORMAL LOW (ref 30.0–36.0)
MCV: 85 fL (ref 78.0–100.0)
PLATELETS: 477 10*3/uL — AB (ref 150–400)
RBC: 3.34 MIL/uL — AB (ref 3.87–5.11)
RDW: 16.4 % — ABNORMAL HIGH (ref 11.5–15.5)
WBC: 5.5 10*3/uL (ref 4.0–10.5)

## 2015-03-26 LAB — PROTEIN ELECTROPHORESIS, SERUM
A/G Ratio: 0.9 (ref 0.7–1.7)
ALPHA-1-GLOBULIN: 0.3 g/dL (ref 0.0–0.4)
ALPHA-2-GLOBULIN: 0.8 g/dL (ref 0.4–1.0)
Albumin ELP: 2.5 g/dL — ABNORMAL LOW (ref 2.9–4.4)
BETA GLOBULIN: 1.1 g/dL (ref 0.7–1.3)
GAMMA GLOBULIN: 0.7 g/dL (ref 0.4–1.8)
Globulin, Total: 2.9 g/dL (ref 2.2–3.9)
Total Protein ELP: 5.4 g/dL — ABNORMAL LOW (ref 6.0–8.5)

## 2015-03-26 LAB — PROTIME-INR
INR: 1.11 (ref 0.00–1.49)
PROTHROMBIN TIME: 14.4 s (ref 11.6–15.2)

## 2015-03-26 LAB — BONE MARROW EXAM

## 2015-03-26 LAB — ERYTHROPOIETIN: Erythropoietin: 34.5 m[IU]/mL — ABNORMAL HIGH (ref 2.6–18.5)

## 2015-03-26 MED ORDER — HYDROCODONE-ACETAMINOPHEN 5-325 MG PO TABS
1.0000 | ORAL_TABLET | ORAL | Status: DC | PRN
  Administered 2015-03-27 (×2): 2 via ORAL
  Administered 2015-03-28: 1 via ORAL
  Filled 2015-03-26 (×2): qty 2

## 2015-03-26 MED ORDER — MIDAZOLAM HCL 2 MG/2ML IJ SOLN
INTRAMUSCULAR | Status: AC
  Filled 2015-03-26: qty 2

## 2015-03-26 MED ORDER — LIDOCAINE HCL 1 % IJ SOLN
INTRAMUSCULAR | Status: AC
  Filled 2015-03-26: qty 20

## 2015-03-26 MED ORDER — FENTANYL CITRATE (PF) 100 MCG/2ML IJ SOLN
INTRAMUSCULAR | Status: AC
  Filled 2015-03-26: qty 2

## 2015-03-26 MED ORDER — MIDAZOLAM HCL 2 MG/2ML IJ SOLN
INTRAMUSCULAR | Status: AC | PRN
  Administered 2015-03-26: 1 mg via INTRAVENOUS

## 2015-03-26 MED ORDER — HEPARIN SODIUM (PORCINE) 5000 UNIT/ML IJ SOLN
5000.0000 [IU] | Freq: Three times a day (TID) | INTRAMUSCULAR | Status: DC
  Administered 2015-03-26 – 2015-03-28 (×4): 5000 [IU] via SUBCUTANEOUS
  Filled 2015-03-26 (×3): qty 1

## 2015-03-26 MED ORDER — SPIRONOLACTONE 25 MG PO TABS
12.5000 mg | ORAL_TABLET | Freq: Every day | ORAL | Status: DC | PRN
  Administered 2015-03-26 – 2015-03-28 (×3): 12.5 mg via ORAL
  Filled 2015-03-26 (×3): qty 1

## 2015-03-26 MED ORDER — FENTANYL CITRATE (PF) 100 MCG/2ML IJ SOLN
INTRAMUSCULAR | Status: AC | PRN
  Administered 2015-03-26: 50 ug via INTRAVENOUS

## 2015-03-26 NOTE — Progress Notes (Signed)
[facility] PROGRESS NOTE  [name] [name] LNL:[id number] DOB: 12-Apr-1931 DOA: 03/23/2015 PCP: Hollace Kinnier, DO  Assessment/Plan:  Principal Problem:   FUO (fever of unknown origin) - Currently undergoing work up by [employer] and ID - chest x ray obtained and reporting cavitary lesion which was not seen on CT scan. - CT chest w/o contrast obtained and reports several enlarged nodes including left axillary lymph nodes. Hematologist/Oncologist recommending General surgery consult for biopsy of axillary lymph node for better tissue sampling. - Placed General surgery consult.  Active Problems:   Essential hypertension: not well controlled - Cardizem and metoprolol - Will add patient's spironolactone today. - Place on low sodium diet.    Paroxysmal atrial fibrillation - Hold Eliquis secondary to biopsy procedure today - Metoprolol and cardizem    Anemia, chronic disease - hgb level stable    Malnutrition of moderate degree - Pt on Ensure  Code Status: full Family Communication:  Discussed with family at bedside Disposition Plan: Pending recommendations from specialist.   Consultants:  Oncology  ID  Procedures:  Bone marrow biopsy  Antibiotics:  None  HPI/Subjective: Pt concerned about her blood pressure. Agreeable to restart her spironolactone  Objective: Filed Vitals:   03/26/15 1020  BP: 168/90  Pulse: 73  Temp:   Resp: 20    Intake/Output Summary (Last 24 hours) at 03/26/15 1145 Last data filed at 03/26/15 0827  Gross per 24 hour  Intake    720 ml  Output      0 ml  Net    720 ml   Filed Weights   03/23/15 1333 03/24/15 2133  Weight: 77.4 kg (170 lb 10.2 oz) 75 kg (165 lb 5.5 oz)    Exam:   General:  Pt in nad, alert and awake  Cardiovascular: s1 and s2 wnl, no mrg  Respiratory: CTA BL, no wheezes  Abdomen: cta bl, no wheezes  Musculoskeletal: soft, ND, NT   Data Reviewed: Basic Metabolic Panel:  Recent Labs Lab  03/23/15 1710 03/24/15 0450  NA 133* 135  K 4.1 4.1  CL 96* 98*  CO2 27 28  GLUCOSE 120* 100*  BUN 15 12  CREATININE 0.82 0.68  CALCIUM 9.6 9.0   Liver Function Tests:  Recent Labs Lab 03/23/15 1710  AST 39  ALT 33  ALKPHOS 58  BILITOT 0.3  PROT 6.6  ALBUMIN 2.7*   No results for input(s): LIPASE, AMYLASE in the last 168 hours. No results for input(s): AMMONIA in the last 168 hours. CBC:  Recent Labs Lab 03/23/15 1710 03/24/15 0450 03/26/15 0525  WBC 4.7 3.9* 5.5  NEUTROABS 3.2  --   --   HGB 8.0* 7.7* 8.5*  HCT 25.2* 25.0* 28.4*  MCV 82.4 83.3 85.0  PLT 327 336 477*   Cardiac Enzymes: No results for input(s): CKTOTAL, CKMB, CKMBINDEX, TROPONINI in the last 168 hours. BNP (last 3 results) No results for input(s): BNP in the last 8760 hours.  ProBNP (last 3 results)  Recent Labs  07/18/14 2357  PROBNP 386.2    CBG: No results for input(s): GLUCAP in the last 168 hours.  Recent Results (from the past 240 hour(s))  Clostridium Difficile by PCR     Status: None   Collection Time: 03/21/15  2:37 PM  Result Value Ref Range Status   Toxigenic C Difficile by pcr Not Detected Not Detected Final    Comment: This test is for use only with liquid or soft stools; performance characteristics of other clinical  specimen types have not been established.   This assay was performed by Cepheid GeneXpert(R) PCR. The performance characteristics of this assay have been determined by [employer]. Performance characteristics refer to the analytical performance of the test.      Studies: Ct Chest Wo Contrast  03/24/2015   CLINICAL DATA:  Possible cavitary lesion in the right lung base on chest x-ray earlier same date. Fever of unknown origin.  EXAM: CT CHEST WITHOUT CONTRAST  TECHNIQUE: Multidetector CT imaging of the chest was performed following the standard protocol without IV contrast.  COMPARISON:  Two-view chest x-ray yesterday and earlier. CT chest  11/27/2014, 05/08/2014, 05/30/2013.  FINDINGS: Lungs: Since the most recent prior CT, worsening of airspace opacities in the peripheral left upper lobe, some of the opacities now conglomerate and masslike in appearance. Interval improvement in the airspace opacities in the peripheral right lower lobe, associated with bronchiectasis and several mucous filled dilated bronchi, associated with pleuroparenchymal scarring. Stable scar and bronchiectasis in the right middle lobe and lingula. No evidence of a right base cavitary lesion as questioned on yesterday's chest x-ray. A bleb or air cyst in the right lower lobe likely accounts for the chest x-ray finding.  Trachea/bronchi: Central airways patent with mild central bronchial wall thickening. Bronchiectasis in the right lower lobe, right middle lobe and lingula as noted above.  Pleura: Pleural thickening involving the right posterior and lateral hemithorax, some what improved when compared to the most recent prior CT. No pleural thickening on the left. No pleural effusions.  Mediastinum:  No mediastinal masses.  Heart and Vascular: Normal heart size. Moderate 3 vessel coronary atherosclerosis. No pericardial effusion. Ascending thoracic aortic aneurysm with maximum diameter approximately 4.8 cm, unchanged since the most recent CT in April, 2016, only slightly increased since the October, 2014 examination where it measured 4.5 cm.  Lymphatic: Interval marked progression of mediastinal lymphadenopathy since the April, 2016 CT. Enlarged nodes are present in stations 2R, 3A, 4R, 5 and 7. Index low right paratracheal (4) nodes measure 3.0 x 2.4 cm. Index AP window (station 5) nodes measure 3.5 x 1.7 cm. Index subcarinal (station 7) nodes measure 1.7 x 3.6 cm. Index left upper mediastinal (station 3A) nodes measure 2.9 x 2.4 cm. Right paracardiac lymph node measures approximately 1.5 x 1.3 cm. Enlarged left axillary lymph nodes, an index node measuring 1.6 x 3.2 cm.  Other  findings: None.  Visualized lower neck: Thyroid gland unremarkable. Enlarged left supraclavicular lymph nodes.  Visualized upper abdomen: Mildly enlarged retrocrural lymph nodes, increased in size since the most recent prior CT. Small gastrohepatic ligament lymph nodes, new. Visualized upper abdomen otherwise unremarkable.  Musculoskeletal: Osseous demineralization. Multilevel degenerative disc disease and spondylosis. No evidence of osseous metastatic disease.  IMPRESSION: 1. Worsening airspace opacities in the left upper lobe, now conglomerate and masslike, since the most recent prior CT from April, 2016. Opacity has been present in this region dating back to October, 2014. While this might represent an acute pneumonia, adenocarcinoma (previously known as bronchoalveolar cell carcinoma) can have this appearance, particularly given the presence over an approximate 2 year interval. 2. Pathologic lymphadenopathy involving the mediastinum, left axilla, and left supraclavicular region with enlarging lymph nodes in the visualized upper abdomen since the April, 2016 CT. Metastatic disease or lymphoma/leukemia is suspected. 3. No evidence of cavitary lesion in the right base as questioned on yesterday's chest x-ray. A bleb or air cyst in the right lower lobe likely accounts for the chest x-ray finding.  4. Interval improvement in the pleuroparenchymal scar in the right lower lobe since the April, 2016 CT. 5. Scar and bronchiectasis is also present in the right middle lobe and lingula, unchanged.   Electronically Signed   By: Evangeline Dakin M.D.   On: 03/24/2015 21:23   Ct Biopsy  03/26/2015   CLINICAL DATA:  Anemia, fever and chills  EXAM: CT GUIDED DEEP ILIAC BONE ASPIRATION AND CORE BIOPSY  TECHNIQUE: The procedure, risks (including but not limited to bleeding, infection, organ damage ), benefits, and alternatives were explained to the patient. Questions regarding the procedure were encouraged and answered. The  patient understands and consents to the procedure. Patient was placed supine on the CT gantry and limited axial scans through the pelvis were obtained. Appropriate skin entry site was identified. Skin site was marked, prepped with Betadine, draped in usual sterile fashion, and infiltrated locally with 1% lidocaine.  Intravenous Fentanyl and Versed were administered as conscious sedation during continuous cardiorespiratory monitoring by the radiology RN, with a total moderate sedation time of 6 minutes.  Under CT fluoroscopic guidance an 11-gauge Cook trocar bone needle was advanced into the right iliac bone just lateral to the sacroiliac joint. Once needle tip position was confirmed, coaxial core and aspiration samples were obtained. The final sample was obtained using the guiding needle itself, which was then removed. Post procedure scans show no hematoma or fracture. Patient tolerated procedure well.  COMPLICATIONS: COMPLICATIONS none  IMPRESSION: 1. Technically successful CT guided right iliac bone core and aspiration biopsy.   Electronically Signed   By: Lucrezia Europe M.D.   On: 03/26/2015 11:01    Scheduled Meds: . ALPRAZolam  0.5 mg Oral Once  . apixaban  2.5 mg Oral BID  . budesonide-formoterol  2 puff Inhalation BID  . diltiazem  120 mg Oral q morning - 10a  . dofetilide  250 mcg Oral BID  . feeding supplement (ENSURE ENLIVE)  237 mL Oral BID BM  . fentaNYL      . lidocaine      . metoprolol succinate  50 mg Oral QHS  . midazolam      . saccharomyces boulardii  250 mg Oral BID  . sodium chloride  3 mL Intravenous Q12H   Continuous Infusions:    Time spent: > 35 minutes    Velvet Bathe  [facility] Pager [phone]. If 7PM-7AM, please contact night-coverage at [email or web address], password [facility] 03/26/2015, 11:45 AM  LOS: 3 days

## 2015-03-26 NOTE — Procedures (Signed)
CT-guided  R iliac bone marrow aspiration and core biopsy No complication No blood loss. See complete dictation in [facility] PACS  

## 2015-03-26 NOTE — Progress Notes (Signed)
The "picture" has cleared up a little over the weekend. She had a CT scan of her chest on Saturday which showed enlarging adenopathy. There also appears be some change in the left upper lobe.  This certainly has the "feel" of lymphoma. I would think bronchogenic carcinoma would be less likely.  She was clearly iron deficient. She has a very low reticulocyte count. She is going for a bone marrow test today or possibly tomorrow depending on the radiology schedule.  Her LDH is normal at 141.  Her sedimentation rate jumped up quite a bit to 108.  Everything is really pointing toward malignancy. I think that one of these thoracic/axillary nodes will have to be biopsied. I would probably get surgery involved. We will need a good amount of tissue for the appropriate studies. I would NOT recommend a percutaneous biopsy as the amount of tissue is marginal.  She might also benefit from a mediastinoscopy. I think this would be the best way to get enough tissue. I certainly cannot palpate any axillary lymph nodes when I examined her. However, an axillary node excisional biopsy would be easiest on her  She is on blood thinner. This will have to be managed by the primary service. I would think that she could come off ELIQUIS right now and it will be out of her system in 1 day.  This is certainly becoming much more complex. I'm glad that the CT scan was done as this really has given [place] the "direction" to move in.   Pete E.  Romans 13:10.

## 2015-03-26 NOTE — Progress Notes (Signed)
Utilization review complete. Syed Zukas RN CCM Case Mgmt phone [phone] 

## 2015-03-26 NOTE — Progress Notes (Signed)
Patient ID: [name] [name], female   DOB: Feb 07, 1931, 81 y.o.   MRN: [id number]         [facility]    Date of Admission:  03/23/2015     Principal Problem:   FUO (fever of unknown origin) Active Problems:   Fever of unknown origin (FUO)   Anemia, chronic disease   Essential hypertension   Paroxysmal atrial fibrillation   Current use of long term anticoagulation   Malnutrition of moderate degree   . ALPRAZolam  0.5 mg Oral Once  . budesonide-formoterol  2 puff Inhalation BID  . diltiazem  120 mg Oral q morning - 10a  . dofetilide  250 mcg Oral BID  . feeding supplement (ENSURE ENLIVE)  237 mL Oral BID BM  . fentaNYL      . heparin subcutaneous  5,000 Units Subcutaneous 3 times per day  . lidocaine      . metoprolol succinate  50 mg Oral QHS  . midazolam      . saccharomyces boulardii  250 mg Oral BID  . sodium chloride  3 mL Intravenous Q12H    SUBJECTIVE: She tolerated bone marrow biopsy well this morning. She admits to being moderately anxious about everything she has been going through.  Review of Systems: Pertinent items are noted in HPI.  Past Medical History  Diagnosis Date  . COPD (chronic obstructive pulmonary disease)     pft's 09/15/2005, FEV1 1.69 (76%) with ration67 and better x 14% p B2, DLC 95%  . Hypertension   . Bronchitis, chronic   . Celiac disease   . Anxiety   . Pneumonia     hx of x3  . Acute blood loss anemia 09/07/13  . Anxiety state, unspecified   . Bronchiectasis 2012  . Cerebrovascular disease, unspecified 05/26/13  . Cerebral atrophy 05/26/13  . Anemia   . Atrial fibrillation   . Aneurysm of aorta     Social History  Substance Use Topics  . Smoking status: Former Smoker -- 0.50 packs/day for 60 years    Types: Cigarettes    Quit date: 01/27/2012  . Smokeless tobacco: Never Used  . Alcohol Use: Yes     Comment: Wine and Gin daily    Family History  Problem Relation Age of Onset  . Heart disease  Father 23    heart attack  . Prostate cancer Paternal Grandfather   . Pulmonary fibrosis Brother     former smoker   Allergies  Allergen Reactions  . Benicar [Olmesartan] Diarrhea and Other (See Comments)    Hypotension  . Gluten Meal Diarrhea    Intolerance  . Metronidazole Nausea Only  . Morphine Hives  . Latex Other (See Comments)    Red and irritated    OBJECTIVE: Filed Vitals:   03/26/15 0827 03/26/15 0955 03/26/15 1010 03/26/15 1020  BP: 163/87 158/95 166/95 168/90  Pulse: 74 73 74 73  Temp: 97.7 F (36.5 C)     TempSrc: Oral     Resp: 17 20 14 20   Height:      Weight:      SpO2: 95% 96% 97% 94%   Body mass index is 26.7 kg/(m^2).  General: she is calm and a little bit more withdrawn today Skin: no rash Lymph nodes: She has palpable left axillary adenopathy Lungs: clear Cor: regular S1 and S2 with no murmurs Abdomen: nontender without palpable masses   Lab Results Lab Results  Component Value Date  WBC 5.5 03/26/2015   HGB 8.5* 03/26/2015   HCT 28.4* 03/26/2015   MCV 85.0 03/26/2015   PLT 477* 03/26/2015    Lab Results  Component Value Date   CREATININE 0.68 03/24/2015   BUN 12 03/24/2015   NA 135 03/24/2015   K 4.1 03/24/2015   CL 98* 03/24/2015   CO2 28 03/24/2015    Lab Results  Component Value Date   ALT 33 03/23/2015   AST 39 03/23/2015   ALKPHOS 58 03/23/2015   BILITOT 0.3 03/23/2015   SED RATE  Date Value  03/23/2015 108 mm/hr*  03/08/2015 51 mm   CRP (mg/dL)  Date Value  03/23/2015 19.3*     Microbiology: Recent Results (from the past 240 hour(s))  Clostridium Difficile by PCR     Status: None   Collection Time: 03/21/15  2:37 PM  Result Value Ref Range Status   Toxigenic C Difficile by pcr Not Detected Not Detected Final    Comment: This test is for use only with liquid or soft stools; performance characteristics of other clinical specimen types have not been established.   This assay was performed by Cepheid  GeneXpert(R) PCR. The performance characteristics of this assay have been determined by [employer]. Performance characteristics refer to the analytical performance of the test.    BAL cultures 02/20/2015: All are nondiagnostic/negative at nearly 5 weeks   ASSESSMENT: I am hopeful that we will able to get a diagnosis results of her bone marrow biopsy and tomorrow's pending lymph node biopsies.  PLAN: 1. Observe off of antibiotics 2. Await results of bone marrow examination and lymph node biopsy results  Michel Bickers, MD [facility] [place] [phone] pager   [phone] cell 03/26/2015, 2:32 PM

## 2015-03-26 NOTE — Care Management Important Message (Signed)
Important Message  Patient Details  Name: [name] [name] MRN: [id number] Date of Birth: 04-21-1931   Medicare Important Message Given:  Yes-second notification given    Erenest Rasher, RN 03/26/2015, 3:15 PM

## 2015-03-26 NOTE — Consult Note (Signed)
[facility] Consult Note  [name] [name] 03-Sep-1930  [id number].    Requesting MD: Dr. Marin Olp Chief Complaint/Reason for Consult: Lymphadenopathy/need for lymph node biopsy  HPI:  81 y/o white female with PMH COPD, AFIB on Eliquis, celiac disease, bronchiectasis presented to [facility] on 03/23/15 with fever 103*F, chills, and sweats for several weeks.  Admits to weakness and fatigue.  She was admitted for FUO workup and seen by ID and oncology.  She has been found be anemia, and have enlarged lymph nodes in her abdomen, chest, axilla.  The patient notes that she has felt large lymph nodes in her left axilla and left neck which she has only noticed since admission.  She says they are tender to touch.  She notes here fevers,chills, sweats have since resolved since admission/IV antibiotics.  She denies any abdominal pain, N/V/D.  No radiating pain, no precipitating or alleviating factors.    Of note per the records, some of these symptoms actually developed as far back as May 2016.  She's been treated prophylactic for pneumonia, had CT of lungs back in 11/27/14 where reactive LAD was seen.  On 02/20/15 she underwent bronchocopy which was normal.  HIV, Rheum factors and ANA negative.  We were consulted regarding excisional lymph node biopsy.   ROS: All systems reviewed and otherwise negative except for as above  Family History  Problem Relation Age of Onset  . Heart disease Father 14    heart attack  . Prostate cancer Paternal Grandfather   . Pulmonary fibrosis Brother     former smoker    Past Medical History  Diagnosis Date  . COPD (chronic obstructive pulmonary disease)     pft's 09/15/2005, FEV1 1.69 (76%) with ration67 and better x 14% p B2, DLC 95%  . Hypertension   . Bronchitis, chronic   . Celiac disease   . Anxiety   . Pneumonia     hx of x3  . Acute blood loss anemia 09/07/13  . Anxiety state, unspecified   . Bronchiectasis 2012  . Cerebrovascular disease, unspecified  05/26/13  . Cerebral atrophy 05/26/13  . Anemia   . Atrial fibrillation   . Aneurysm of aorta     Past Surgical History  Procedure Laterality Date  . Nasal sinus surgery  years ago  . Total knee arthroplasty Bilateral [id number]  . Excision morton's neuroma Bilateral 35 years ago, 2010  . Total hip arthroplasty Right 2009    Dr. Telford Nab  . Cataract extraction Bilateral ~8 years ago  . Total hip arthroplasty Left 09/06/2013    Dr. Salli Quarry  . Video bronchoscopy Bilateral 02/20/2015    Procedure: VIDEO BRONCHOSCOPY WITHOUT FLUORO;  Surgeon: Tanda Rockers, MD;  Location: [facility];  Service: Cardiopulmonary;  Laterality: Bilateral;    Social History:  reports that she quit smoking about 3 years ago. Her smoking use included Cigarettes. She has a 30 pack-year smoking history. She has never used smokeless tobacco. She reports that she drinks alcohol. She reports that she does not use illicit drugs.  Allergies:  Allergies  Allergen Reactions  . Benicar [Olmesartan] Diarrhea and Other (See Comments)    Hypotension  . Gluten Meal Diarrhea    Intolerance  . Metronidazole Nausea Only  . Morphine Hives  . Latex Other (See Comments)    Red and irritated    Medications Prior to Admission  Medication Sig Dispense Refill  . acetaminophen (TYLENOL) 650 MG CR tablet Take 650-1,300 mg by mouth 2 (two)  times daily. 1300 mg every morning and 650 mg at lunch.    . albuterol (PROVENTIL HFA;VENTOLIN HFA) 108 (90 BASE) MCG/ACT inhaler Inhale 1-2 puffs into the lungs every 6 (six) hours as needed for wheezing or shortness of breath. 1 Inhaler 3  . ALPRAZolam (XANAX) 0.25 MG tablet Take 0.25 mg by mouth 3 (three) times daily as needed for anxiety.     Marland Kitchen diltiazem (CARDIZEM CD) 120 MG 24 hr capsule Take 120 mg by mouth every morning.     . dofetilide (TIKOSYN) 250 MCG capsule Take 1 capsule (250 mcg total) by mouth 2 (two) times daily. 60 capsule 12  . ELIQUIS 5 MG TABS tablet Take 2.5 mg by mouth 2  (two) times daily.    Marland Kitchen glucosamine-chondroitin 500-400 MG tablet Take 1 tablet by mouth 2 (two) times daily.      . metoprolol succinate (TOPROL-XL) 50 MG 24 hr tablet Take 50 mg by mouth at bedtime. Take with or immediately following a meal.    . Multiple Vitamins-Minerals (MULTIVITAMIN ADULT) TABS Take 1 tablet by mouth daily.     Vladimir Faster Glycol-Propyl Glycol (SYSTANE OP) Place 1 drop into both eyes every morning.    . Saccharomyces boulardii (FLORASTOR PO) Take 1 tablet by mouth daily.     Marland Kitchen spironolactone (ALDACTONE) 25 MG tablet Take 12.5 mg by mouth daily as needed (systolic >263).     . SYMBICORT 160-4.5 MCG/ACT inhaler USE 2 PUFFS TWICE DAILY. 10.2 g 5  . ALPRAZolam (XANAX) 0.5 MG tablet Take 1 tablet (0.5 mg total) by mouth every 6 (six) hours as needed for anxiety or sleep. (Patient not taking: Reported on 03/24/2015) 120 tablet 0  . ciprofloxacin (CIPRO) 500 MG tablet Take 1 tablet (500 mg total) by mouth 2 (two) times daily. (Patient not taking: Reported on 03/24/2015) 20 tablet 0  . diphenhydramine-acetaminophen (TYLENOL PM) 25-500 MG TABS Take 2 tablets by mouth at bedtime.    Marland Kitchen losartan (COZAAR) 100 MG tablet Take 50 mg by mouth at bedtime as needed (Systolic >785).       Blood pressure 168/90, pulse 73, temperature 97.7 F (36.5 C), temperature source Oral, resp. rate 20, height 5\' 6"  (1.676 m), weight 75 kg (165 lb 5.5 oz), SpO2 94 %. Physical Exam: General: pleasant, WD/WN white female who is laying in bed in NAD HEENT: head is normocephalic, atraumatic.  Sclera are noninjected.  Ears and nose without any masses or lesions.  Mouth is pink and moist Lymph:  Multiple palpable 1-2 cm lymph nodes in the anterior/posterior cervical chain on left as well as left axilla.  I do not feel any palpable lymph nodes on the right or in the b/l groin. Heart: regular, rate, and rhythm.  No obvious murmurs, gallops, or rubs noted.  Palpable pedal pulses bilaterally Lungs: CTAB, no wheezes,  rhonchi, or rales noted.  Respiratory effort nonlabored, great effort. Abd: soft, NT/ND, +BS, no masses, hernias, or organomegaly MS: all 4 extremities are symmetrical with no cyanosis, clubbing, or edema. Skin: warm and dry with no masses, lesions, or rashes Psych: A&Ox3 with an appropriate affect.   Results for orders placed or performed during the hospital encounter of 03/23/15 (from the past 48 hour(s))  CBC     Status: Abnormal   Collection Time: 03/26/15  5:25 AM  Result Value Ref Range   WBC 5.5 4.0 - 10.5 K/uL   RBC 3.34 (L) 3.87 - 5.11 MIL/uL   Hemoglobin 8.5 (L) 12.0 - 15.0  g/dL   HCT 28.4 (L) 36.0 - 46.0 %   MCV 85.0 78.0 - 100.0 fL   MCH 25.4 (L) 26.0 - 34.0 pg   MCHC 29.9 (L) 30.0 - 36.0 g/dL   RDW 16.4 (H) 11.5 - 15.5 %   Platelets 477 (H) 150 - 400 K/uL  Protime-INR     Status: None   Collection Time: 03/26/15  5:25 AM  Result Value Ref Range   Prothrombin Time 14.4 11.6 - 15.2 seconds   INR 1.11 0.00 - 1.49  Bone marrow exam     Status: None   Collection Time: 03/26/15 10:15 AM  Result Value Ref Range   Bone Marrow Exam SEE PATHOLOGY REPORT [id number]     Comment: Performed at [facility] Chest Wo Contrast  03/24/2015   CLINICAL DATA:  Possible cavitary lesion in the right lung base on chest x-ray earlier same date. Fever of unknown origin.  EXAM: CT CHEST WITHOUT CONTRAST  TECHNIQUE: Multidetector CT imaging of the chest was performed following the standard protocol without IV contrast.  COMPARISON:  Two-view chest x-ray yesterday and earlier. CT chest 11/27/2014, 05/08/2014, 05/30/2013.  FINDINGS: Lungs: Since the most recent prior CT, worsening of airspace opacities in the peripheral left upper lobe, some of the opacities now conglomerate and masslike in appearance. Interval improvement in the airspace opacities in the peripheral right lower lobe, associated with bronchiectasis and several mucous filled dilated bronchi, associated with  pleuroparenchymal scarring. Stable scar and bronchiectasis in the right middle lobe and lingula. No evidence of a right base cavitary lesion as questioned on yesterday's chest x-ray. A bleb or air cyst in the right lower lobe likely accounts for the chest x-ray finding.  Trachea/bronchi: Central airways patent with mild central bronchial wall thickening. Bronchiectasis in the right lower lobe, right middle lobe and lingula as noted above.  Pleura: Pleural thickening involving the right posterior and lateral hemithorax, some what improved when compared to the most recent prior CT. No pleural thickening on the left. No pleural effusions.  Mediastinum:  No mediastinal masses.  Heart and Vascular: Normal heart size. Moderate 3 vessel coronary atherosclerosis. No pericardial effusion. Ascending thoracic aortic aneurysm with maximum diameter approximately 4.8 cm, unchanged since the most recent CT in April, 2016, only slightly increased since the October, 2014 examination where it measured 4.5 cm.  Lymphatic: Interval marked progression of mediastinal lymphadenopathy since the April, 2016 CT. Enlarged nodes are present in stations 2R, 3A, 4R, 5 and 7. Index low right paratracheal (4) nodes measure 3.0 x 2.4 cm. Index AP window (station 5) nodes measure 3.5 x 1.7 cm. Index subcarinal (station 7) nodes measure 1.7 x 3.6 cm. Index left upper mediastinal (station 3A) nodes measure 2.9 x 2.4 cm. Right paracardiac lymph node measures approximately 1.5 x 1.3 cm. Enlarged left axillary lymph nodes, an index node measuring 1.6 x 3.2 cm.  Other findings: None.  Visualized lower neck: Thyroid gland unremarkable. Enlarged left supraclavicular lymph nodes.  Visualized upper abdomen: Mildly enlarged retrocrural lymph nodes, increased in size since the most recent prior CT. Small gastrohepatic ligament lymph nodes, new. Visualized upper abdomen otherwise unremarkable.  Musculoskeletal: Osseous demineralization. Multilevel degenerative  disc disease and spondylosis. No evidence of osseous metastatic disease.  IMPRESSION: 1. Worsening airspace opacities in the left upper lobe, now conglomerate and masslike, since the most recent prior CT from April, 2016. Opacity has been present in this region dating back to October, 2014. While this  might represent an acute pneumonia, adenocarcinoma (previously known as bronchoalveolar cell carcinoma) can have this appearance, particularly given the presence over an approximate 2 year interval. 2. Pathologic lymphadenopathy involving the mediastinum, left axilla, and left supraclavicular region with enlarging lymph nodes in the visualized upper abdomen since the April, 2016 CT. Metastatic disease or lymphoma/leukemia is suspected. 3. No evidence of cavitary lesion in the right base as questioned on yesterday's chest x-ray. A bleb or air cyst in the right lower lobe likely accounts for the chest x-ray finding. 4. Interval improvement in the pleuroparenchymal scar in the right lower lobe since the April, 2016 CT. 5. Scar and bronchiectasis is also present in the right middle lobe and lingula, unchanged.   Electronically Signed   By: Evangeline Dakin M.D.   On: 03/24/2015 21:23   Ct Biopsy  03/26/2015   CLINICAL DATA:  Anemia, fever and chills  EXAM: CT GUIDED DEEP ILIAC BONE ASPIRATION AND CORE BIOPSY  TECHNIQUE: The procedure, risks (including but not limited to bleeding, infection, organ damage ), benefits, and alternatives were explained to the patient. Questions regarding the procedure were encouraged and answered. The patient understands and consents to the procedure. Patient was placed supine on the CT gantry and limited axial scans through the pelvis were obtained. Appropriate skin entry site was identified. Skin site was marked, prepped with Betadine, draped in usual sterile fashion, and infiltrated locally with 1% lidocaine.  Intravenous Fentanyl and Versed were administered as conscious sedation during  continuous cardiorespiratory monitoring by the radiology RN, with a total moderate sedation time of 6 minutes.  Under CT fluoroscopic guidance an 11-gauge Cook trocar bone needle was advanced into the right iliac bone just lateral to the sacroiliac joint. Once needle tip position was confirmed, coaxial core and aspiration samples were obtained. The final sample was obtained using the guiding needle itself, which was then removed. Post procedure scans show no hematoma or fracture. Patient tolerated procedure well.  COMPLICATIONS: COMPLICATIONS none  IMPRESSION: 1. Technically successful CT guided right iliac bone core and aspiration biopsy.   Electronically Signed   By: Lucrezia Europe M.D.   On: 03/26/2015 11:01      Assessment/Plan FUO Lymphadenopathy - left axilla, anterior/post cervical chain -Concern for lymphoma or other type of malignancy ?lung Need for lymph node biopsy -Has a couple of sites to choose from in left axilla vs left ant/post cervical chain -SCD's, currently on Eliquis (last dose 03/25/15 at 2105), this was discontinued, heparin SQ started -Dr. Marlou Starks wants to hold Eliquis at least 24hours -Dr. Marlou Starks to see and eval and give further recommendations about timing.    Nat Christen, [facility] 03/26/2015, 12:47 PM Pager: [phone]

## 2015-03-26 NOTE — Clinical Social Work Note (Signed)
CSW received call from Butch Penny, [place] with [facility]  Per Butch Penny, patient is from [place], but may need rehab at their health care center prior to returning to her apartment, as this has been the case in the past. CSW informed Butch Penny that a PT evaluation will be requested.   Airyanna Dipalma Givens, MSW, LCSW Licensed Clinical Social Worker [place] [phone]

## 2015-03-27 ENCOUNTER — Inpatient Hospital Stay (HOSPITAL_COMMUNITY): Payer: Medicare Other | Admitting: Anesthesiology

## 2015-03-27 ENCOUNTER — Encounter (HOSPITAL_COMMUNITY): Payer: Self-pay | Admitting: Certified Registered Nurse Anesthetist

## 2015-03-27 ENCOUNTER — Encounter (HOSPITAL_COMMUNITY)
Admission: AD | Disposition: A | Discharge status: Home / Self Care | Payer: Self-pay | Source: Ambulatory Visit | Attending: Family Medicine

## 2015-03-27 DIAGNOSIS — R591 Generalized enlarged lymph nodes: Secondary | ICD-10-CM

## 2015-03-27 HISTORY — PX: AXILLARY LYMPH NODE BIOPSY: SHX5737

## 2015-03-27 LAB — SURGICAL PCR SCREEN
MRSA, PCR: NEGATIVE
STAPHYLOCOCCUS AUREUS: NEGATIVE

## 2015-03-27 LAB — COMPREHENSIVE METABOLIC PANEL
ALBUMIN: 3 g/dL — AB (ref 3.5–5.0)
ALT: 27 U/L (ref 14–54)
AST: 29 U/L (ref 15–41)
Alkaline Phosphatase: 66 U/L (ref 38–126)
Anion gap: 6 (ref 5–15)
BUN: 12 mg/dL (ref 6–20)
CHLORIDE: 96 mmol/L — AB (ref 101–111)
CO2: 34 mmol/L — AB (ref 22–32)
CREATININE: 0.76 mg/dL (ref 0.44–1.00)
Calcium: 9.8 mg/dL (ref 8.9–10.3)
GFR calc Af Amer: >60 mL/min (ref >60)
GLUCOSE: 115 mg/dL — AB (ref 65–99)
POTASSIUM: 4.2 mmol/L (ref 3.5–5.1)
SODIUM: 136 mmol/L (ref 135–145)
Total Bilirubin: 0.4 mg/dL (ref 0.3–1.2)
Total Protein: 7.5 g/dL (ref 6.5–8.1)

## 2015-03-27 LAB — CBC WITH DIFFERENTIAL/PLATELET
BASOS ABS: 0 10*3/uL (ref 0.0–0.1)
BASOS PCT: 0 % (ref 0–1)
EOS PCT: 1 % (ref 0–5)
Eosinophils Absolute: 0.1 10*3/uL (ref 0.0–0.7)
HCT: 31.7 % — ABNORMAL LOW (ref 36.0–46.0)
Hemoglobin: 9.6 g/dL — ABNORMAL LOW (ref 12.0–15.0)
LYMPHS PCT: 18 % (ref 12–46)
Lymphs Abs: 1.1 10*3/uL (ref 0.7–4.0)
MCH: 25.7 pg — ABNORMAL LOW (ref 26.0–34.0)
MCHC: 30.3 g/dL (ref 30.0–36.0)
MCV: 85 fL (ref 78.0–100.0)
MONO ABS: 0.5 10*3/uL (ref 0.1–1.0)
Monocytes Relative: 8 % (ref 3–12)
Neutro Abs: 4.4 10*3/uL (ref 1.7–7.7)
Neutrophils Relative %: 73 % (ref 43–77)
PLATELETS: 536 10*3/uL — AB (ref 150–400)
RBC: 3.73 MIL/uL — AB (ref 3.87–5.11)
RDW: 16.4 % — AB (ref 11.5–15.5)
WBC: 6.1 10*3/uL (ref 4.0–10.5)

## 2015-03-27 LAB — QUANTIFERON IN TUBE
QFT TB AG MINUS NIL VALUE: 0.01 [IU]/mL
QUANTIFERON MITOGEN VALUE: 2.56 IU/mL
QUANTIFERON NIL VALUE: 0.06 [IU]/mL
QUANTIFERON TB AG VALUE: 0.07 IU/mL
QUANTIFERON TB GOLD: NEGATIVE

## 2015-03-27 LAB — QUANTIFERON TB GOLD ASSAY (BLOOD)

## 2015-03-27 SURGERY — AXILLARY LYMPH NODE BIOPSY
Anesthesia: General | Site: Axilla | Laterality: Left

## 2015-03-27 MED ORDER — CEFAZOLIN SODIUM-DEXTROSE 2-3 GM-% IV SOLR
INTRAVENOUS | Status: AC
  Filled 2015-03-27: qty 50

## 2015-03-27 MED ORDER — HYDROCODONE-ACETAMINOPHEN 5-325 MG PO TABS
ORAL_TABLET | ORAL | Status: AC
  Filled 2015-03-27: qty 2

## 2015-03-27 MED ORDER — ONDANSETRON HCL 4 MG/2ML IJ SOLN
INTRAMUSCULAR | Status: AC
  Filled 2015-03-27: qty 2

## 2015-03-27 MED ORDER — MIDAZOLAM HCL 5 MG/5ML IJ SOLN
INTRAMUSCULAR | Status: DC | PRN
  Administered 2015-03-27 (×2): 1 mg via INTRAVENOUS

## 2015-03-27 MED ORDER — MEPERIDINE HCL 25 MG/ML IJ SOLN: 6.2500 mg | INTRAMUSCULAR | Status: DC | PRN

## 2015-03-27 MED ORDER — FENTANYL CITRATE (PF) 250 MCG/5ML IJ SOLN
INTRAMUSCULAR | Status: AC
  Filled 2015-03-27: qty 5

## 2015-03-27 MED ORDER — 0.9 % SODIUM CHLORIDE (POUR BTL) OPTIME
TOPICAL | Status: DC | PRN
  Administered 2015-03-27: 1000 mL

## 2015-03-27 MED ORDER — BUPIVACAINE-EPINEPHRINE 0.25% -1:200000 IJ SOLN
INTRAMUSCULAR | Status: DC | PRN
  Administered 2015-03-27: 14 mL

## 2015-03-27 MED ORDER — MIDAZOLAM HCL 2 MG/2ML IJ SOLN
INTRAMUSCULAR | Status: AC
  Filled 2015-03-27: qty 2

## 2015-03-27 MED ORDER — FENTANYL CITRATE (PF) 100 MCG/2ML IJ SOLN
INTRAMUSCULAR | Status: AC
  Filled 2015-03-27: qty 2

## 2015-03-27 MED ORDER — LACTATED RINGERS IV SOLN
INTRAVENOUS | Status: DC | PRN
  Administered 2015-03-27: 09:00:00 via INTRAVENOUS

## 2015-03-27 MED ORDER — PROPOFOL 10 MG/ML IV BOLUS
INTRAVENOUS | Status: DC | PRN
  Administered 2015-03-27: 150 mg via INTRAVENOUS

## 2015-03-27 MED ORDER — CEFAZOLIN SODIUM-DEXTROSE 2-3 GM-% IV SOLR
INTRAVENOUS | Status: DC | PRN
  Administered 2015-03-27: 2 g via INTRAVENOUS

## 2015-03-27 MED ORDER — FENTANYL CITRATE (PF) 100 MCG/2ML IJ SOLN
25.0000 ug | INTRAMUSCULAR | Status: DC | PRN
  Administered 2015-03-27: 50 ug via INTRAVENOUS

## 2015-03-27 MED ORDER — SODIUM CHLORIDE 0.9 % IV SOLN
510.0000 mg | Freq: Once | INTRAVENOUS | Status: AC
  Administered 2015-03-28: 510 mg via INTRAVENOUS
  Filled 2015-03-27 (×2): qty 17

## 2015-03-27 MED ORDER — LIDOCAINE HCL (CARDIAC) 20 MG/ML IV SOLN
INTRAVENOUS | Status: AC
  Filled 2015-03-27: qty 5

## 2015-03-27 MED ORDER — LIDOCAINE HCL (CARDIAC) 20 MG/ML IV SOLN
INTRAVENOUS | Status: DC | PRN
  Administered 2015-03-27: 50 mg via INTRAVENOUS

## 2015-03-27 MED ORDER — ALPRAZOLAM 0.25 MG PO TABS: 0.2500 mg | ORAL_TABLET | Freq: Three times a day (TID) | ORAL | Status: DC | PRN

## 2015-03-27 MED ORDER — BUPIVACAINE-EPINEPHRINE (PF) 0.25% -1:200000 IJ SOLN
INTRAMUSCULAR | Status: AC
  Filled 2015-03-27: qty 30

## 2015-03-27 MED ORDER — LACTATED RINGERS IV SOLN
Freq: Once | INTRAVENOUS | Status: AC
  Administered 2015-03-27: 08:00:00 via INTRAVENOUS

## 2015-03-27 MED ORDER — PROPOFOL 10 MG/ML IV BOLUS
INTRAVENOUS | Status: AC
  Filled 2015-03-27: qty 20

## 2015-03-27 MED ORDER — LACTATED RINGERS IV SOLN: INTRAVENOUS | Status: DC

## 2015-03-27 MED ORDER — DARBEPOETIN ALFA 300 MCG/0.6ML IJ SOSY
300.0000 ug | PREFILLED_SYRINGE | Freq: Once | INTRAMUSCULAR | Status: AC
  Administered 2015-03-27: 300 ug via SUBCUTANEOUS
  Filled 2015-03-27: qty 0.6

## 2015-03-27 MED ORDER — FENTANYL CITRATE (PF) 100 MCG/2ML IJ SOLN
INTRAMUSCULAR | Status: DC | PRN
  Administered 2015-03-27 (×3): 50 ug via INTRAVENOUS

## 2015-03-27 MED ORDER — PROMETHAZINE HCL 25 MG/ML IJ SOLN
6.2500 mg | INTRAMUSCULAR | Status: DC | PRN
Start: 2015-03-27 — End: 2015-03-27

## 2015-03-27 MED ORDER — EPHEDRINE SULFATE 50 MG/ML IJ SOLN
INTRAMUSCULAR | Status: DC | PRN
  Administered 2015-03-27 (×2): 5 mg via INTRAVENOUS

## 2015-03-27 SURGICAL SUPPLY — 42 items
APPLIER CLIP 9.375 MED OPEN (MISCELLANEOUS) ×3
APR CLP MED 9.3 20 MLT OPN (MISCELLANEOUS) ×1
CANISTER SUCTION 2500CC (MISCELLANEOUS) IMPLANT
CHLORAPREP W/TINT 10.5 ML (MISCELLANEOUS) ×3 IMPLANT
CLIP APPLIE 9.375 MED OPEN (MISCELLANEOUS) ×1 IMPLANT
CONT SPEC 4OZ CLIKSEAL STRL BL (MISCELLANEOUS) ×3 IMPLANT
COVER SURGICAL LIGHT HANDLE (MISCELLANEOUS) ×3 IMPLANT
COVER TRANSDUCER ULTRASND GEL (DRAPE) IMPLANT
DECANTER SPIKE VIAL GLASS SM (MISCELLANEOUS) IMPLANT
DRAPE PED LAPAROTOMY (DRAPES) ×3 IMPLANT
ELECT CAUTERY BLADE 6.4 (BLADE) ×3 IMPLANT
ELECT COATED BLADE 2.86 ST (ELECTRODE) ×3 IMPLANT
ELECT REM PT RETURN 9FT ADLT (ELECTROSURGICAL) ×3
ELECTRODE REM PT RTRN 9FT ADLT (ELECTROSURGICAL) ×1 IMPLANT
GAUZE SPONGE 4X4 16PLY XRAY LF (GAUZE/BANDAGES/DRESSINGS) IMPLANT
GLOVE BIO SURGEON STRL SZ7.5 (GLOVE) IMPLANT
GLOVE BIOGEL PI IND STRL 7.0 (GLOVE) ×1 IMPLANT
GLOVE BIOGEL PI INDICATOR 7.0 (GLOVE) ×2
GLOVE SURG SS PI 7.0 STRL IVOR (GLOVE) ×3 IMPLANT
GLOVE SURG SS PI 7.5 STRL IVOR (GLOVE) ×3 IMPLANT
GOWN STRL REUS W/ TWL LRG LVL3 (GOWN DISPOSABLE) ×2 IMPLANT
GOWN STRL REUS W/TWL LRG LVL3 (GOWN DISPOSABLE) ×6
KIT BASIN OR (CUSTOM PROCEDURE TRAY) ×3 IMPLANT
KIT ROOM TURNOVER OR (KITS) ×3 IMPLANT
LIQUID BAND (GAUZE/BANDAGES/DRESSINGS) ×3 IMPLANT
NEEDLE 18GX1X1/2 (RX/OR ONLY) (NEEDLE) ×3 IMPLANT
NEEDLE HYPO 25GX1X1/2 BEV (NEEDLE) ×6 IMPLANT
NS IRRIG 1000ML POUR BTL (IV SOLUTION) ×3 IMPLANT
PACK GENERAL/GYN (CUSTOM PROCEDURE TRAY) ×3 IMPLANT
PACK SURGICAL SETUP 50X90 (CUSTOM PROCEDURE TRAY) IMPLANT
PAD ARMBOARD 7.5X6 YLW CONV (MISCELLANEOUS) ×3 IMPLANT
PENCIL BUTTON HOLSTER BLD 10FT (ELECTRODE) ×3 IMPLANT
SUT MNCRL AB 4-0 PS2 18 (SUTURE) ×3 IMPLANT
SUT VIC AB 3-0 SH 27 (SUTURE) ×6
SUT VIC AB 3-0 SH 27X BRD (SUTURE) ×1 IMPLANT
SUT VIC AB 3-0 SH 27XBRD (SUTURE) ×1 IMPLANT
SYR CONTROL 10ML LL (SYRINGE) ×3 IMPLANT
TOWEL OR 17X24 6PK STRL BLUE (TOWEL DISPOSABLE) ×3 IMPLANT
TOWEL OR 17X26 10 PK STRL BLUE (TOWEL DISPOSABLE) IMPLANT
TUBE CONNECTING 12'X1/4 (SUCTIONS)
TUBE CONNECTING 12X1/4 (SUCTIONS) IMPLANT
YANKAUER SUCT BULB TIP NO VENT (SUCTIONS) IMPLANT

## 2015-03-27 NOTE — Anesthesia Preprocedure Evaluation (Addendum)
Anesthesia Evaluation  Patient identified by MRN, date of birth, ID band Patient awake    Reviewed: Allergy & Precautions, NPO status , Patient's Chart, lab work & pertinent test results, reviewed documented beta blocker date and time   Airway Mallampati: I  TM Distance: >3 FB Neck ROM: Full    Dental  (+) Teeth Intact   Pulmonary COPDformer smoker,  breath sounds clear to auscultation        Cardiovascular hypertension, Pt. on medications and Pt. on home beta blockers + CAD and + Peripheral Vascular Disease + dysrhythmias Atrial Fibrillation Rhythm:Regular Rate:Normal     Neuro/Psych PSYCHIATRIC DISORDERS Anxiety negative neurological ROS     GI/Hepatic negative GI ROS, Neg liver ROS,   Endo/Other  negative endocrine ROS  Renal/GU negative Renal ROS  negative genitourinary   Musculoskeletal  (+) Arthritis -,   Abdominal   Peds negative pediatric ROS (+)  Hematology  (+) anemia ,   Anesthesia Other Findings   Reproductive/Obstetrics                            Lab Results  Component Value Date   WBC 6.1 03/27/2015   HGB 9.6* 03/27/2015   HCT 31.7* 03/27/2015   MCV 85.0 03/27/2015   PLT 536* 03/27/2015   Lab Results  Component Value Date   CREATININE 0.68 03/24/2015   BUN 12 03/24/2015   NA 135 03/24/2015   K 4.1 03/24/2015   CL 98* 03/24/2015   CO2 28 03/24/2015   Lab Results  Component Value Date   INR 1.11 03/26/2015   INR 0.91 09/01/2013   INR 1.0 01/26/2007   (03/2015) - EKG: normal sinus rhythm.  2015: Study Conclusions  - Left ventricle: The cavity size was normal. Systolic function was normal. The estimated ejection fraction was in the range of 55% to 60%. Wall motion was normal; there were no regional wall motion abnormalities. - Left atrium: The atrium was mildly dilated.   Anesthesia Physical Anesthesia Plan  ASA: III  Anesthesia Plan: General    Post-op Pain Management:    Induction: Intravenous  Airway Management Planned: LMA  Additional Equipment:   Intra-op Plan:   Post-operative Plan:   Informed Consent: I have reviewed the patients History and Physical, chart, labs and discussed the procedure including the risks, benefits and alternatives for the proposed anesthesia with the patient or authorized representative who has indicated his/her understanding and acceptance.   Dental advisory given  Plan Discussed with: CRNA  Anesthesia Plan Comments:         Anesthesia Quick Evaluation

## 2015-03-27 NOTE — Progress Notes (Signed)
[facility] PROGRESS NOTE  ALNITA AYBAR PYK:[id number] DOB: 02/03/1931 DOA: 03/23/2015 PCP: Hollace Kinnier, DO   Brief Narrative 81 y/o with history of atrial fibrillation on eliquis, essential htn, and anemia presenting with FUO at request of ID specialist. Hematologist/oncologist on board. General surgery consulted for lymph node biopsy give ct scan results.  Assessment/Plan:  Principal Problem:   FUO (fever of unknown origin) - Currently undergoing work up by [employer] and ID - chest x ray obtained and reporting cavitary lesion which was not seen on CT scan. - CT chest w/o contrast obtained and reports several enlarged nodes including left axillary lymph nodes. Hematologist/Oncologist recommending General surgery consulted for biopsy of axillary lymph node for better tissue sampling. - Placed General surgery consult.  Active Problems:   Essential hypertension: not well controlled - Cardizem and metoprolol , spironolactone - Placed on low sodium diet.    Paroxysmal atrial fibrillation - Hold Eliquis secondary to biopsy procedure, will defer to general surgery when to continue - Metoprolol and cardizem    Anemia, chronic disease - hgb level stable    Malnutrition of moderate degree - Pt on Ensure  Code Status: full Family Communication:  Discussed with family at bedside Disposition Plan: Pending recommendations from specialist.   Consultants:  Oncology  ID  General surgery  Procedures:  Bone marrow biopsy LEFT AXILLARY LYMPH NODE BIOPSY (Left)  Antibiotics:  None  HPI/Subjective: Pt has no new complaints today.  Objective: Filed Vitals:   03/27/15 1751  BP: 142/90  Pulse: 96  Temp: 97.8 F (36.6 C)  Resp: 18    Intake/Output Summary (Last 24 hours) at 03/27/15 1822 Last data filed at 03/27/15 1752  Gross per 24 hour  Intake   1540 ml  Output      0 ml  Net   1540 ml   Filed Weights   03/23/15 1333 03/24/15 2133 03/26/15 2051   Weight: 77.4 kg (170 lb 10.2 oz) 75 kg (165 lb 5.5 oz) 76.975 kg (169 lb 11.2 oz)    Exam:   General:  Pt in nad, alert and awake  Cardiovascular: s1 and s2 wnl, no mrg  Respiratory: CTA BL, no wheezes  Abdomen: cta bl, no wheezes  Musculoskeletal: soft, ND, NT   Data Reviewed: Basic Metabolic Panel:  Recent Labs Lab 03/23/15 1710 03/24/15 0450 03/27/15 0732  NA 133* 135 136  K 4.1 4.1 4.2  CL 96* 98* 96*  CO2 27 28 34*  GLUCOSE 120* 100* 115*  BUN 15 12 12   CREATININE 0.82 0.68 0.76  CALCIUM 9.6 9.0 9.8   Liver Function Tests:  Recent Labs Lab 03/23/15 1710 03/27/15 0732  AST 39 29  ALT 33 27  ALKPHOS 58 66  BILITOT 0.3 0.4  PROT 6.6 7.5  ALBUMIN 2.7* 3.0*   No results for input(s): LIPASE, AMYLASE in the last 168 hours. No results for input(s): AMMONIA in the last 168 hours. CBC:  Recent Labs Lab 03/23/15 1710 03/24/15 0450 03/26/15 0525 03/27/15 0732  WBC 4.7 3.9* 5.5 6.1  NEUTROABS 3.2  --   --  4.4  HGB 8.0* 7.7* 8.5* 9.6*  HCT 25.2* 25.0* 28.4* 31.7*  MCV 82.4 83.3 85.0 85.0  PLT 327 336 477* 536*   Cardiac Enzymes: No results for input(s): CKTOTAL, CKMB, CKMBINDEX, TROPONINI in the last 168 hours. BNP (last 3 results) No results for input(s): BNP in the last 8760 hours.  ProBNP (last 3 results)  Recent Labs  07/18/14 2357  PROBNP 386.2    CBG: No results for input(s): GLUCAP in the last 168 hours.  Recent Results (from the past 240 hour(s))  Clostridium Difficile by PCR     Status: None   Collection Time: 03/21/15  2:37 PM  Result Value Ref Range Status   Toxigenic C Difficile by pcr Not Detected Not Detected Final    Comment: This test is for use only with liquid or soft stools; performance characteristics of other clinical specimen types have not been established.   This assay was performed by Cepheid GeneXpert(R) PCR. The performance characteristics of this assay have been determined by [employer].  Performance characteristics refer to the analytical performance of the test.   Surgical pcr screen     Status: None   Collection Time: 03/26/15 10:51 PM  Result Value Ref Range Status   MRSA, PCR NEGATIVE NEGATIVE Final   Staphylococcus aureus NEGATIVE NEGATIVE Final    Comment:        The Xpert SA Assay (FDA approved for NASAL specimens in patients over 35 years of age), is one component of a comprehensive surveillance program.  Test performance has been validated by [facility] for patients greater than or equal to 37 year old. It is not intended to diagnose infection nor to guide or monitor treatment.      Studies: Ct Biopsy  03/26/2015   CLINICAL DATA:  Anemia, fever and chills  EXAM: CT GUIDED DEEP ILIAC BONE ASPIRATION AND CORE BIOPSY  TECHNIQUE: The procedure, risks (including but not limited to bleeding, infection, organ damage ), benefits, and alternatives were explained to the patient. Questions regarding the procedure were encouraged and answered. The patient understands and consents to the procedure. Patient was placed supine on the CT gantry and limited axial scans through the pelvis were obtained. Appropriate skin entry site was identified. Skin site was marked, prepped with Betadine, draped in usual sterile fashion, and infiltrated locally with 1% lidocaine.  Intravenous Fentanyl and Versed were administered as conscious sedation during continuous cardiorespiratory monitoring by the radiology RN, with a total moderate sedation time of 6 minutes.  Under CT fluoroscopic guidance an 11-gauge Cook trocar bone needle was advanced into the right iliac bone just lateral to the sacroiliac joint. Once needle tip position was confirmed, coaxial core and aspiration samples were obtained. The final sample was obtained using the guiding needle itself, which was then removed. Post procedure scans show no hematoma or fracture. Patient tolerated procedure well.  COMPLICATIONS: COMPLICATIONS  none  IMPRESSION: 1. Technically successful CT guided right iliac bone core and aspiration biopsy.   Electronically Signed   By: Lucrezia Europe M.D.   On: 03/26/2015 11:01    Scheduled Meds: . ALPRAZolam  0.5 mg Oral Once  . budesonide-formoterol  2 puff Inhalation BID  . diltiazem  120 mg Oral q morning - 10a  . dofetilide  250 mcg Oral BID  . feeding supplement (ENSURE ENLIVE)  237 mL Oral BID BM  . ferumoxytol  510 mg Intravenous Once  . heparin subcutaneous  5,000 Units Subcutaneous 3 times per day  . HYDROcodone-acetaminophen      . metoprolol succinate  50 mg Oral QHS  . saccharomyces boulardii  250 mg Oral BID  . sodium chloride  3 mL Intravenous Q12H   Continuous Infusions:    Time spent: > 35 minutes    Velvet Bathe  [facility] Pager [phone]. If 7PM-7AM, please contact night-coverage at [email or web address], password [facility] 03/27/2015, 6:22 PM  LOS: 4 days

## 2015-03-27 NOTE — Anesthesia Procedure Notes (Signed)
Procedure Name: LMA Insertion Performed by: Judeth Cornfield T Pre-anesthesia Checklist: Patient identified, Emergency Drugs available, Suction available, Patient being monitored and Timeout performed Patient Re-evaluated:Patient Re-evaluated prior to inductionOxygen Delivery Method: Circle system utilized Preoxygenation: Pre-oxygenation with 100% oxygen Intubation Type: IV induction Ventilation: Mask ventilation without difficulty LMA: LMA inserted LMA Size: 4.0 Number of attempts: 1 Placement Confirmation: breath sounds checked- equal and bilateral and positive ETCO2 Tube secured with: Tape Dental Injury: Teeth and Oropharynx as per pre-operative assessment

## 2015-03-27 NOTE — Progress Notes (Signed)
PT Cancellation Note  Patient Details Name: RYANE KONIECZNY MRN: [id number] DOB: 1930-12-02   Cancelled Treatment:    Reason Eval/Treat Not Completed: Patient at procedure or test/unavailable   For biopsy at this time;   Will follow up later today as time allows;  Otherwise, will follow up for PT tomorrow;   Thank you,  Roney Marion, [place] Pager [phone] Office [phone]     Roney Marion [facility] 03/27/2015, 8:29 AM

## 2015-03-27 NOTE — Op Note (Signed)
03/23/2015 - 03/27/2015  10:06 AM  PATIENT:  [name] [name]  81 y.o. female  PRE-OPERATIVE DIAGNOSIS:  Lymphadenopathy  POST-OPERATIVE DIAGNOSIS:  Lymphadenopathy  PROCEDURE:  Procedure(s): LEFT AXILLARY LYMPH NODE BIOPSY (Left)  SURGEON:  Surgeon(s) and Role:    * Jovita Kussmaul, MD - Primary  PHYSICIAN ASSISTANT:   ASSISTANTS: none   ANESTHESIA:   general  EBL:  Total I/O In: 700 [I.V.:700] Out: -   BLOOD ADMINISTERED:none  DRAINS: none   LOCAL MEDICATIONS USED:  MARCAINE     SPECIMEN:  Source of Specimen:  left axillary lymph node  DISPOSITION OF SPECIMEN:  PATHOLOGY  COUNTS:  YES  TOURNIQUET:  * No tourniquets in log *  DICTATION: .Dragon Dictation  After informed consent was obtained the patient was brought to the operating room and placed in the supine position on the operating room table. After adequate induction of general anesthesia the patient's left axilla was prepped with ChloraPrep, allowed to dry, and draped in usual sterile manner. A small transversely oriented incision was made with a 15 blade knife. This incision was carried through the skin and subcutaneous tissue sharply with the electrocautery until the axilla was entered. A [place] retractor was deployed. The left axilla was examined and a large mobile lymph node was palpable. The lymph node was grasped with a tonsil clamp. The lymph node was excised by combination of sharp and Bovie dissection. The lymphatics and vessels feeding the lymph node were controlled with clips. Once the lymph node was removed it was sent to pathology for lymphoma protocol. The wound was infiltrated with quarter percent Marcaine. The wound was examined and found to be hemostatic. The deep layer of the wound was closed with interrupted 3-0 Vicryl stitches. The skin was then closed with a running 4-0 Monocryl subcuticular stitch. Dermabond dressings were applied. The patient tolerated the procedure well. At the end of the case all  needle sponge and instrument counts were correct. The patient was then awakened and taken to recovery in stable condition.  PLAN OF CARE: Admit to inpatient   PATIENT DISPOSITION:  PACU - hemodynamically stable.   Delay start of Pharmacological VTE agent (>24hrs) due to surgical blood loss or risk of bleeding: no

## 2015-03-27 NOTE — Progress Notes (Signed)
PT Cancellation Note  Patient Details Name: [name] [name] MRN: [id number] DOB: Nov 10, 1930   Cancelled Treatment:    Reason Eval/Treat Not Completed: Other (comment)   Second attempt for PT;  Currently napping;  Jonni Sanger, RN informed me of pt's request not to be awakened;   Will follow up for PT eval tomorrow;   Roney Marion, [place] Pager [phone] Office [phone]    Roney Marion [facility] 03/27/2015, 4:06 PM

## 2015-03-27 NOTE — Anesthesia Postprocedure Evaluation (Signed)
  Anesthesia Post-op Note  Patient: [name] [name]  Procedure(s) Performed: Procedure(s): LEFT AXILLARY LYMPH NODE BIOPSY (Left)  Patient Location: PACU  Anesthesia Type:General  Level of Consciousness: awake and alert   Airway and Oxygen Therapy: Patient Spontanous Breathing  Post-op Pain: mild  Post-op Assessment: Post-op Vital signs reviewed and Patient's Cardiovascular Status Stable              Post-op Vital Signs: Reviewed and stable  Last Vitals:  Filed Vitals:   03/27/15 1106  BP: 157/87  Pulse: 74  Temp: 36.4 C  Resp: 18    Complications: No apparent anesthesia complications

## 2015-03-27 NOTE — Interval H&P Note (Signed)
History and Physical Interval Note:  03/27/2015 8:40 AM  [name] [name]  has presented today for surgery, with the diagnosis of Lymphadenopathy  The various methods of treatment have been discussed with the patient and family. After consideration of risks, benefits and other options for treatment, the patient has consented to  Procedure(s): LEFT AXILLARY LYMPH NODE BIOPSY (Left) as a surgical intervention .  The patient's history has been reviewed, patient examined, no change in status, stable for surgery.  I have reviewed the patient's chart and labs.  Questions were answered to the patient's satisfaction.     TOTH III,Ricco Dershem S

## 2015-03-27 NOTE — Progress Notes (Signed)
Ms. Pettis is doing okay this morning. She will be going for a left axillary lymph node excisional biopsy today.  She did undergo a bone marrow biopsy yesterday. Maybe, some pulmonary results will be out today but more than likely tomorrow.  She has not had any obvious temperatures.  I think her anemia is multifactorial. I do believe that she is iron deficient. Her erythropoietin level is only 34.  Her reticulocyte count is 70.5% when corrected. It is clear that her bone marrow is not getting any stimulus because of her low erythropoietin level.  Her hemoglobin yesterday was 8.5.  I want to go ahead and give her a dose of IV iron. I also want to give her a dose of Aranesp. I talked to her about Aranesp. Again, I think that she will benefit from Aranesp. I think the complications from Aranesp will be minimal. She already is on anticoagulation. She recently go ahead and try to Aranesp. I will give her a 300 g dose.  On her physical exam, I still cannot palpate any axillary lymphadenopathy. Her temperature is 98 degrees. Blood pressure 144/92.  No l ymphadenopathy is present.  No liver or spleen tip noted.  I am "taking a leap of faith" and beleiving that she has lymphoma.  She needs labs done today.  We had an excellent prayer session this AM!!!  Pete E.  Romans 8:28

## 2015-03-27 NOTE — Progress Notes (Signed)
Patient ID: [name] [name], female   DOB: 1931/02/15, 81 y.o.   MRN: [id number]         [facility] for Infectious Disease    Date of Admission:  03/23/2015     Principal Problem:   FUO (fever of unknown origin) Active Problems:   Fever of unknown origin (FUO)   Anemia, chronic disease   Essential hypertension   Paroxysmal atrial fibrillation   Current use of long term anticoagulation   Malnutrition of moderate degree   . ALPRAZolam  0.5 mg Oral Once  . budesonide-formoterol  2 puff Inhalation BID  . diltiazem  120 mg Oral q morning - 10a  . dofetilide  250 mcg Oral BID  . feeding supplement (ENSURE ENLIVE)  237 mL Oral BID BM  . ferumoxytol  510 mg Intravenous Once  . heparin subcutaneous  5,000 Units Subcutaneous 3 times per day  . HYDROcodone-acetaminophen      . metoprolol succinate  50 mg Oral QHS  . saccharomyces boulardii  250 mg Oral BID  . sodium chloride  3 mL Intravenous Q12H   OBJECTIVE: Filed Vitals:   03/27/15 1024 03/27/15 1030 03/27/15 1036 03/27/15 1106  BP:  157/80  157/87  Pulse: 74  71 74  Temp:    97.5 F (36.4 C)  TempSrc:    Oral  Resp: 10  6 18   Height:      Weight:      SpO2: 97% 92% 97% 96%   Body mass index is 27.4 kg/(m^2).  ASSESSMENT: [name] [name] underwent left axillary lymph node excisional biopsy this morning. She is sleeping so I chose not to disturb her now. Hopefully we will have the results of her bone marrow biopsy and lymph node biopsy preliminary path reports tomorrow in order to begin to get a clearer idea what has been causing her intermittent fevers of unknown origin.  PLAN: 1. Await results of bone marrow and lymph node biopsy review  [name] Bickers, MD [facility] [place] [facility] [phone] pager   [phone] cell 03/27/2015, 4:02 PM

## 2015-03-27 NOTE — H&P (View-Only) (Signed)
[facility] Surgery Consult Note  TAIMANE STIMMEL Oct 13, 1930  [id number].    Requesting MD: Dr. Marin Olp Chief Complaint/Reason for Consult: Lymphadenopathy/need for lymph node biopsy  HPI:  81 y/o white female with PMH COPD, AFIB on Eliquis, celiac disease, bronchiectasis presented to [facility] on 03/23/15 with fever 103*F, chills, and sweats for several weeks.  Admits to weakness and fatigue.  She was admitted for FUO workup and seen by ID and oncology.  She has been found be anemia, and have enlarged lymph nodes in her abdomen, chest, axilla.  The patient notes that she has felt large lymph nodes in her left axilla and left neck which she has only noticed since admission.  She says they are tender to touch.  She notes here fevers,chills, sweats have since resolved since admission/IV antibiotics.  She denies any abdominal pain, N/V/D.  No radiating pain, no precipitating or alleviating factors.    Of note per the records, some of these symptoms actually developed as far back as May 2016.  She's been treated prophylactic for pneumonia, had CT of lungs back in 11/27/14 where reactive LAD was seen.  On 02/20/15 she underwent bronchocopy which was normal.  HIV, Rheum factors and ANA negative.  We were consulted regarding excisional lymph node biopsy.   ROS: All systems reviewed and otherwise negative except for as above  Family History  Problem Relation Age of Onset  . Heart disease Father 53    heart attack  . Prostate cancer Paternal Grandfather   . Pulmonary fibrosis Brother     former smoker    Past Medical History  Diagnosis Date  . COPD (chronic obstructive pulmonary disease)     pft's 09/15/2005, FEV1 1.69 (76%) with ration67 and better x 14% p B2, DLC 95%  . Hypertension   . Bronchitis, chronic   . Celiac disease   . Anxiety   . Pneumonia     hx of x3  . Acute blood loss anemia 09/07/13  . Anxiety state, unspecified   . Bronchiectasis 2012  . Cerebrovascular disease, unspecified  05/26/13  . Cerebral atrophy 05/26/13  . Anemia   . Atrial fibrillation   . Aneurysm of aorta     Past Surgical History  Procedure Laterality Date  . Nasal sinus surgery  years ago  . Total knee arthroplasty Bilateral [id number]  . Excision morton's neuroma Bilateral 35 years ago, 2010  . Total hip arthroplasty Right 2009    Dr. Telford Nab  . Cataract extraction Bilateral ~8 years ago  . Total hip arthroplasty Left 09/06/2013    Dr. Salli Quarry  . Video bronchoscopy Bilateral 02/20/2015    Procedure: VIDEO BRONCHOSCOPY WITHOUT FLUORO;  Surgeon: Tanda Rockers, MD;  Location: [facility] ENDOSCOPY;  Service: Cardiopulmonary;  Laterality: Bilateral;    Social History:  reports that she quit smoking about 3 years ago. Her smoking use included Cigarettes. She has a 30 pack-year smoking history. She has never used smokeless tobacco. She reports that she drinks alcohol. She reports that she does not use illicit drugs.  Allergies:  Allergies  Allergen Reactions  . Benicar [Olmesartan] Diarrhea and Other (See Comments)    Hypotension  . Gluten Meal Diarrhea    Intolerance  . Metronidazole Nausea Only  . Morphine Hives  . Latex Other (See Comments)    Red and irritated    Medications Prior to Admission  Medication Sig Dispense Refill  . acetaminophen (TYLENOL) 650 MG CR tablet Take 650-1,300 mg by mouth 2 (two)  times daily. 1300 mg every morning and 650 mg at lunch.    . albuterol (PROVENTIL HFA;VENTOLIN HFA) 108 (90 BASE) MCG/ACT inhaler Inhale 1-2 puffs into the lungs every 6 (six) hours as needed for wheezing or shortness of breath. 1 Inhaler 3  . ALPRAZolam (XANAX) 0.25 MG tablet Take 0.25 mg by mouth 3 (three) times daily as needed for anxiety.     Marland Kitchen diltiazem (CARDIZEM CD) 120 MG 24 hr capsule Take 120 mg by mouth every morning.     . dofetilide (TIKOSYN) 250 MCG capsule Take 1 capsule (250 mcg total) by mouth 2 (two) times daily. 60 capsule 12  . ELIQUIS 5 MG TABS tablet Take 2.5 mg by mouth 2  (two) times daily.    Marland Kitchen glucosamine-chondroitin 500-400 MG tablet Take 1 tablet by mouth 2 (two) times daily.      . metoprolol succinate (TOPROL-XL) 50 MG 24 hr tablet Take 50 mg by mouth at bedtime. Take with or immediately following a meal.    . Multiple Vitamins-Minerals (MULTIVITAMIN ADULT) TABS Take 1 tablet by mouth daily.     Vladimir Faster Glycol-Propyl Glycol (SYSTANE OP) Place 1 drop into both eyes every morning.    . Saccharomyces boulardii (FLORASTOR PO) Take 1 tablet by mouth daily.     Marland Kitchen spironolactone (ALDACTONE) 25 MG tablet Take 12.5 mg by mouth daily as needed (systolic >536).     . SYMBICORT 160-4.5 MCG/ACT inhaler USE 2 PUFFS TWICE DAILY. 10.2 g 5  . ALPRAZolam (XANAX) 0.5 MG tablet Take 1 tablet (0.5 mg total) by mouth every 6 (six) hours as needed for anxiety or sleep. (Patient not taking: Reported on 03/24/2015) 120 tablet 0  . ciprofloxacin (CIPRO) 500 MG tablet Take 1 tablet (500 mg total) by mouth 2 (two) times daily. (Patient not taking: Reported on 03/24/2015) 20 tablet 0  . diphenhydramine-acetaminophen (TYLENOL PM) 25-500 MG TABS Take 2 tablets by mouth at bedtime.    Marland Kitchen losartan (COZAAR) 100 MG tablet Take 50 mg by mouth at bedtime as needed (Systolic >644).       Blood pressure 168/90, pulse 73, temperature 97.7 F (36.5 C), temperature source Oral, resp. rate 20, height 5\' 6"  (1.676 m), weight 75 kg (165 lb 5.5 oz), SpO2 94 %. Physical Exam: General: pleasant, WD/WN white female who is laying in bed in NAD HEENT: head is normocephalic, atraumatic.  Sclera are noninjected.  Ears and nose without any masses or lesions.  Mouth is pink and moist Lymph:  Multiple palpable 1-2 cm lymph nodes in the anterior/posterior cervical chain on left as well as left axilla.  I do not feel any palpable lymph nodes on the right or in the b/l groin. Heart: regular, rate, and rhythm.  No obvious murmurs, gallops, or rubs noted.  Palpable pedal pulses bilaterally Lungs: CTAB, no wheezes,  rhonchi, or rales noted.  Respiratory effort nonlabored, great effort. Abd: soft, NT/ND, +BS, no masses, hernias, or organomegaly MS: all 4 extremities are symmetrical with no cyanosis, clubbing, or edema. Skin: warm and dry with no masses, lesions, or rashes Psych: A&Ox3 with an appropriate affect.   Results for orders placed or performed during the hospital encounter of 03/23/15 (from the past 48 hour(s))  CBC     Status: Abnormal   Collection Time: 03/26/15  5:25 AM  Result Value Ref Range   WBC 5.5 4.0 - 10.5 K/uL   RBC 3.34 (L) 3.87 - 5.11 MIL/uL   Hemoglobin 8.5 (L) 12.0 - 15.0  g/dL   HCT 28.4 (L) 36.0 - 46.0 %   MCV 85.0 78.0 - 100.0 fL   MCH 25.4 (L) 26.0 - 34.0 pg   MCHC 29.9 (L) 30.0 - 36.0 g/dL   RDW 16.4 (H) 11.5 - 15.5 %   Platelets 477 (H) 150 - 400 K/uL  Protime-INR     Status: None   Collection Time: 03/26/15  5:25 AM  Result Value Ref Range   Prothrombin Time 14.4 11.6 - 15.2 seconds   INR 1.11 0.00 - 1.49  Bone marrow exam     Status: None   Collection Time: 03/26/15 10:15 AM  Result Value Ref Range   Bone Marrow Exam SEE PATHOLOGY REPORT [id number]     Comment: Performed at [facility] Chest Wo Contrast  03/24/2015   CLINICAL DATA:  Possible cavitary lesion in the right lung base on chest x-ray earlier same date. Fever of unknown origin.  EXAM: CT CHEST WITHOUT CONTRAST  TECHNIQUE: Multidetector CT imaging of the chest was performed following the standard protocol without IV contrast.  COMPARISON:  Two-view chest x-ray yesterday and earlier. CT chest 11/27/2014, 05/08/2014, 05/30/2013.  FINDINGS: Lungs: Since the most recent prior CT, worsening of airspace opacities in the peripheral left upper lobe, some of the opacities now conglomerate and masslike in appearance. Interval improvement in the airspace opacities in the peripheral right lower lobe, associated with bronchiectasis and several mucous filled dilated bronchi, associated with  pleuroparenchymal scarring. Stable scar and bronchiectasis in the right middle lobe and lingula. No evidence of a right base cavitary lesion as questioned on yesterday's chest x-ray. A bleb or air cyst in the right lower lobe likely accounts for the chest x-ray finding.  Trachea/bronchi: Central airways patent with mild central bronchial wall thickening. Bronchiectasis in the right lower lobe, right middle lobe and lingula as noted above.  Pleura: Pleural thickening involving the right posterior and lateral hemithorax, some what improved when compared to the most recent prior CT. No pleural thickening on the left. No pleural effusions.  Mediastinum:  No mediastinal masses.  Heart and Vascular: Normal heart size. Moderate 3 vessel coronary atherosclerosis. No pericardial effusion. Ascending thoracic aortic aneurysm with maximum diameter approximately 4.8 cm, unchanged since the most recent CT in April, 2016, only slightly increased since the October, 2014 examination where it measured 4.5 cm.  Lymphatic: Interval marked progression of mediastinal lymphadenopathy since the April, 2016 CT. Enlarged nodes are present in stations 2R, 3A, 4R, 5 and 7. Index low right paratracheal (4) nodes measure 3.0 x 2.4 cm. Index AP window (station 5) nodes measure 3.5 x 1.7 cm. Index subcarinal (station 7) nodes measure 1.7 x 3.6 cm. Index left upper mediastinal (station 3A) nodes measure 2.9 x 2.4 cm. Right paracardiac lymph node measures approximately 1.5 x 1.3 cm. Enlarged left axillary lymph nodes, an index node measuring 1.6 x 3.2 cm.  Other findings: None.  Visualized lower neck: Thyroid gland unremarkable. Enlarged left supraclavicular lymph nodes.  Visualized upper abdomen: Mildly enlarged retrocrural lymph nodes, increased in size since the most recent prior CT. Small gastrohepatic ligament lymph nodes, new. Visualized upper abdomen otherwise unremarkable.  Musculoskeletal: Osseous demineralization. Multilevel degenerative  disc disease and spondylosis. No evidence of osseous metastatic disease.  IMPRESSION: 1. Worsening airspace opacities in the left upper lobe, now conglomerate and masslike, since the most recent prior CT from April, 2016. Opacity has been present in this region dating back to October, 2014. While this  might represent an acute pneumonia, adenocarcinoma (previously known as bronchoalveolar cell carcinoma) can have this appearance, particularly given the presence over an approximate 2 year interval. 2. Pathologic lymphadenopathy involving the mediastinum, left axilla, and left supraclavicular region with enlarging lymph nodes in the visualized upper abdomen since the April, 2016 CT. Metastatic disease or lymphoma/leukemia is suspected. 3. No evidence of cavitary lesion in the right base as questioned on yesterday's chest x-ray. A bleb or air cyst in the right lower lobe likely accounts for the chest x-ray finding. 4. Interval improvement in the pleuroparenchymal scar in the right lower lobe since the April, 2016 CT. 5. Scar and bronchiectasis is also present in the right middle lobe and lingula, unchanged.   Electronically Signed   By: Evangeline Dakin M.D.   On: 03/24/2015 21:23   Ct Biopsy  03/26/2015   CLINICAL DATA:  Anemia, fever and chills  EXAM: CT GUIDED DEEP ILIAC BONE ASPIRATION AND CORE BIOPSY  TECHNIQUE: The procedure, risks (including but not limited to bleeding, infection, organ damage ), benefits, and alternatives were explained to the patient. Questions regarding the procedure were encouraged and answered. The patient understands and consents to the procedure. Patient was placed supine on the CT gantry and limited axial scans through the pelvis were obtained. Appropriate skin entry site was identified. Skin site was marked, prepped with Betadine, draped in usual sterile fashion, and infiltrated locally with 1% lidocaine.  Intravenous Fentanyl and Versed were administered as conscious sedation during  continuous cardiorespiratory monitoring by the radiology RN, with a total moderate sedation time of 6 minutes.  Under CT fluoroscopic guidance an 11-gauge Cook trocar bone needle was advanced into the right iliac bone just lateral to the sacroiliac joint. Once needle tip position was confirmed, coaxial core and aspiration samples were obtained. The final sample was obtained using the guiding needle itself, which was then removed. Post procedure scans show no hematoma or fracture. Patient tolerated procedure well.  COMPLICATIONS: COMPLICATIONS none  IMPRESSION: 1. Technically successful CT guided right iliac bone core and aspiration biopsy.   Electronically Signed   By: Lucrezia Europe M.D.   On: 03/26/2015 11:01      Assessment/Plan FUO Lymphadenopathy - left axilla, anterior/post cervical chain -Concern for lymphoma or other type of malignancy ?lung Need for lymph node biopsy -Has a couple of sites to choose from in left axilla vs left ant/post cervical chain -SCD's, currently on Eliquis (last dose 03/25/15 at 2105), this was discontinued, heparin SQ started -Dr. Marlou Starks wants to hold Eliquis at least 24hours -Dr. Marlou Starks to see and eval and give further recommendations about timing.    Nat Christen, [facility] 03/26/2015, 12:47 PM Pager: [phone]

## 2015-03-27 NOTE — Transfer of Care (Signed)
Immediate Anesthesia Transfer of Care Note  Patient: [name] [name]  Procedure(s) Performed: Procedure(s): LEFT AXILLARY LYMPH NODE BIOPSY (Left)  Patient Location: PACU  Anesthesia Type:General  Level of Consciousness: awake, alert , oriented, patient cooperative and responds to stimulation  Airway & Oxygen Therapy: Patient Spontanous Breathing and Patient connected to nasal cannula oxygen  Post-op Assessment: Report given to RN and Post -op Vital signs reviewed and stable  Post vital signs: Reviewed and stable  Last Vitals:  Filed Vitals:   03/27/15 1015  BP: 148/74  Pulse: 77  Temp: 36.4 C  Resp: 14    Complications: No apparent anesthesia complications

## 2015-03-28 ENCOUNTER — Inpatient Hospital Stay (HOSPITAL_COMMUNITY): Payer: Medicare Other

## 2015-03-28 ENCOUNTER — Encounter (HOSPITAL_COMMUNITY): Payer: Self-pay | Admitting: General Surgery

## 2015-03-28 DIAGNOSIS — D638 Anemia in other chronic diseases classified elsewhere: Secondary | ICD-10-CM

## 2015-03-28 DIAGNOSIS — I10 Essential (primary) hypertension: Secondary | ICD-10-CM

## 2015-03-28 DIAGNOSIS — I4891 Unspecified atrial fibrillation: Secondary | ICD-10-CM

## 2015-03-28 DIAGNOSIS — I480 Paroxysmal atrial fibrillation: Secondary | ICD-10-CM

## 2015-03-28 LAB — CBC WITH DIFFERENTIAL/PLATELET
BASOS PCT: 1 % (ref 0–1)
Basophils Absolute: 0 10*3/uL (ref 0.0–0.1)
EOS ABS: 0.1 10*3/uL (ref 0.0–0.7)
Eosinophils Relative: 2 % (ref 0–5)
HCT: 29.2 % — ABNORMAL LOW (ref 36.0–46.0)
Hemoglobin: 8.6 g/dL — ABNORMAL LOW (ref 12.0–15.0)
Lymphocytes Relative: 17 % (ref 12–46)
Lymphs Abs: 1 10*3/uL (ref 0.7–4.0)
MCH: 25.7 pg — ABNORMAL LOW (ref 26.0–34.0)
MCHC: 29.5 g/dL — AB (ref 30.0–36.0)
MCV: 87.2 fL (ref 78.0–100.0)
MONO ABS: 0.6 10*3/uL (ref 0.1–1.0)
MONOS PCT: 10 % (ref 3–12)
NEUTROS PCT: 71 % (ref 43–77)
Neutro Abs: 4.3 10*3/uL (ref 1.7–7.7)
PLATELETS: 482 10*3/uL — AB (ref 150–400)
RBC: 3.35 MIL/uL — ABNORMAL LOW (ref 3.87–5.11)
RDW: 16.7 % — AB (ref 11.5–15.5)
WBC: 6.1 10*3/uL (ref 4.0–10.5)

## 2015-03-28 LAB — COMPREHENSIVE METABOLIC PANEL
ALBUMIN: 2.6 g/dL — AB (ref 3.5–5.0)
ALT: 18 U/L (ref 14–54)
ANION GAP: 10 (ref 5–15)
AST: 23 U/L (ref 15–41)
Alkaline Phosphatase: 53 U/L (ref 38–126)
BUN: 15 mg/dL (ref 6–20)
CO2: 29 mmol/L (ref 22–32)
Calcium: 8.9 mg/dL (ref 8.9–10.3)
Chloride: 95 mmol/L — ABNORMAL LOW (ref 101–111)
Creatinine, Ser: 0.74 mg/dL (ref 0.44–1.00)
GFR calc Af Amer: >60 mL/min (ref >60)
GFR calc non Af Amer: >60 mL/min (ref >60)
GLUCOSE: 98 mg/dL (ref 65–99)
POTASSIUM: 4.3 mmol/L (ref 3.5–5.1)
SODIUM: 134 mmol/L — AB (ref 135–145)
Total Bilirubin: 0.3 mg/dL (ref 0.3–1.2)
Total Protein: 6 g/dL — ABNORMAL LOW (ref 6.5–8.1)

## 2015-03-28 LAB — HEPATITIS PANEL, ACUTE
HCV Ab: <0.1 s/co ratio (ref 0.0–0.9)
HEP B C IGM: NEGATIVE
HEP B S AG: NEGATIVE
Hep A IgM: NEGATIVE

## 2015-03-28 MED ORDER — APIXABAN 2.5 MG PO TABS
2.5000 mg | ORAL_TABLET | Freq: Two times a day (BID) | ORAL | Status: DC
  Administered 2015-03-28 – 2015-03-29 (×3): 2.5 mg via ORAL
  Filled 2015-03-28 (×3): qty 1

## 2015-03-28 MED ORDER — LOSARTAN POTASSIUM 50 MG PO TABS
50.0000 mg | ORAL_TABLET | Freq: Every evening | ORAL | Status: DC | PRN
  Administered 2015-03-28: 50 mg via ORAL
  Filled 2015-03-28: qty 1

## 2015-03-28 MED ORDER — POLYETHYLENE GLYCOL 3350 17 G PO PACK
17.0000 g | PACK | Freq: Every day | ORAL | Status: DC | PRN
  Administered 2015-03-28: 17 g via ORAL
  Filled 2015-03-28: qty 1

## 2015-03-28 NOTE — Progress Notes (Signed)
  Echocardiogram 2D Echocardiogram has been performed.  Cindy Hazy 03/28/2015, 4:45 PM

## 2015-03-28 NOTE — Progress Notes (Signed)
Patient ID: JAHANNA RAETHER, female   DOB: 11/16/1930, 81 y.o.   MRN: [id number]         [facility] for Infectious Disease    Date of Admission:  03/23/2015     ASSESSMENT: I reviewed the preliminary results of her bone marrow examination and left axillary lymph node biopsy with Ms. Boza. Her bone marrow is hypercellular with nonspecific changes but the preliminary lymph node biopsy suggest Hodgkin's lymphoma. I reviewed the pathology findings with Dr. Ebony Cargo. Ms. Lichtenberger has asked me to call her daughters, Eustaquio Maize and [name], which I agreed to do. She tells me that she does not want a lot of chemotherapy or radiation therapy. She feels that her daughter, Eustaquio Maize, will be supportive but that her daughter, [name], we'll "right me" on this. I encouraged her to keep an open mind about this and let us get the final report from pathology and recommendations about possible treatment options from a Dr. Marin Olp before making any final decisions. I told her that we would certainly try to manage her symptoms as best as possible regardless of what other therapy she chooses.  PLAN: 1. Await final pathology results 2. Await review and recommendations from Dr. Ripley Fraise, [place] for [place] [phone] pager   [phone] cell 03/28/2015, 12:40 PM

## 2015-03-28 NOTE — Progress Notes (Signed)
[facility] PROGRESS NOTE  [name] [name] WJX:[id number] DOB: [birth year] DOA: 03/23/2015  PCP: Hollace Kinnier, DO  Brief HPI:  81 year old Caucasian female with a past medical history of atrial fibrillation on eliquis, essential hypertension, anemia, presented with fever of unknown origin. She was seen by infectious diseases. She was seen by oncology for lymphadenopathy. She underwent lymph node biopsy and bone marrow biopsy. Results are pending.  Past medical history:  Past Medical History  Diagnosis Date  . COPD (chronic obstructive pulmonary disease)     pft's 09/15/2005, FEV1 1.69 (76%) with ration67 and better x 14% p B2, DLC 95%  . Hypertension   . Bronchitis, chronic   . Celiac disease   . Anxiety   . Pneumonia     hx of x3  . Acute blood loss anemia 09/07/13  . Anxiety state, unspecified   . Bronchiectasis 2012  . Cerebrovascular disease, unspecified 05/26/13  . Cerebral atrophy 05/26/13  . Anemia   . Atrial fibrillation   . Aneurysm of aorta     Consultants: Infectious diseases, oncology, general surgery  Procedures:  Lymph node biopsy. Bone marrow biopsy.  Antibiotics: None  Subjective: Patient feels well this morning. Denies any pain in the abdomen. No significant pain in the left axillary area from where her lymph node was excised. No nausea, vomiting.  Objective: Vital Signs  Filed Vitals:   03/27/15 2039 03/28/15 0643 03/28/15 0839 03/28/15 0955  BP: 131/76 140/92 156/91 125/81  Pulse: 86 80 82 84  Temp: 98.5 F (36.9 C) 97.6 F (36.4 C) 98.2 F (36.8 C)   TempSrc: Oral Oral Oral   Resp: 16 16 18    Height:      Weight:      SpO2: 94% 94% 94%     Intake/Output Summary (Last 24 hours) at 03/28/15 1227 Last data filed at 03/28/15 1013  Gross per 24 hour  Intake    900 ml  Output      0 ml  Net    900 ml   Filed Weights   03/23/15 1333 03/24/15 2133 03/26/15 2051  Weight: 77.4 kg (170 lb 10.2 oz) 75 kg (165 lb 5.5 oz) 76.975 kg (169 lb  11.2 oz)    General appearance: alert, cooperative, appears stated age and no distress Resp: clear to auscultation bilaterally Cardio: regular rate and rhythm, S1, S2 normal, no murmur, click, rub or gallop GI: soft, non-tender; bowel sounds normal; no masses,  no organomegaly Extremities: extremities normal, atraumatic, no cyanosis or edema Pulses: 2+ and symmetric Neurologic: No focal deficits Left axillary area was examined. No bleeding noted. The site of her bone marrow biopsy in the iliac crest was also examined. I'll bruising is noted. No bleeding.  Lab Results:  Basic Metabolic Panel:  Recent Labs Lab 03/23/15 1710 03/24/15 0450 03/27/15 0732 03/28/15 0504  NA 133* 135 136 134*  K 4.1 4.1 4.2 4.3  CL 96* 98* 96* 95*  CO2 27 28 34* 29  GLUCOSE 120* 100* 115* 98  BUN 15 12 12 15   CREATININE 0.82 0.68 0.76 0.74  CALCIUM 9.6 9.0 9.8 8.9   Liver Function Tests:  Recent Labs Lab 03/23/15 1710 03/27/15 0732 03/28/15 0504  AST 39 29 23  ALT 33 27 18  ALKPHOS 58 66 53  BILITOT 0.3 0.4 0.3  PROT 6.6 7.5 6.0*  ALBUMIN 2.7* 3.0* 2.6*   CBC:  Recent Labs Lab 03/23/15 1710 03/24/15 0450 03/26/15 0525 03/27/15 0732 03/28/15 0504  WBC  4.7 3.9* 5.5 6.1 6.1  NEUTROABS 3.2  --   --  4.4 4.3  HGB 8.0* 7.7* 8.5* 9.6* 8.6*  HCT 25.2* 25.0* 28.4* 31.7* 29.2*  MCV 82.4 83.3 85.0 85.0 87.2  PLT 327 336 477* 536* 482*   CBG: No results for input(s): GLUCAP in the last 168 hours.  Recent Results (from the past 240 hour(s))  Clostridium Difficile by PCR     Status: None   Collection Time: 03/21/15  2:37 PM  Result Value Ref Range Status   Toxigenic C Difficile by pcr Not Detected Not Detected Final    Comment: This test is for use only with liquid or soft stools; performance characteristics of other clinical specimen types have not been established.   This assay was performed by Cepheid GeneXpert(R) PCR. The performance characteristics of this assay have been  determined by [employer]. Performance characteristics refer to the analytical performance of the test.   Surgical pcr screen     Status: None   Collection Time: 03/26/15 10:51 PM  Result Value Ref Range Status   MRSA, PCR NEGATIVE NEGATIVE Final   Staphylococcus aureus NEGATIVE NEGATIVE Final    Comment:        The Xpert SA Assay (FDA approved for NASAL specimens in patients over 24 years of age), is one component of a comprehensive surveillance program.  Test performance has been validated by [facility] for patients greater than or equal to 40 year old. It is not intended to diagnose infection nor to guide or monitor treatment.       Studies/Results: No results found.  Medications:  Scheduled: . ALPRAZolam  0.5 mg Oral Once  . apixaban  2.5 mg Oral BID  . budesonide-formoterol  2 puff Inhalation BID  . diltiazem  120 mg Oral q morning - 10a  . dofetilide  250 mcg Oral BID  . feeding supplement (ENSURE ENLIVE)  237 mL Oral BID BM  . ferumoxytol  510 mg Intravenous Once  . metoprolol succinate  50 mg Oral QHS  . saccharomyces boulardii  250 mg Oral BID  . sodium chloride  3 mL Intravenous Q12H   Continuous:  GXQ:JJHERDEYCXKGY **OR** acetaminophen, ALPRAZolam, alum & mag hydroxide-simeth, HYDROcodone-acetaminophen, losartan, ondansetron **OR** ondansetron (ZOFRAN) IV, oxyCODONE, polyethylene glycol, spironolactone  Assessment/Plan:  Principal Problem:   FUO (fever of unknown origin) Active Problems:   Essential hypertension   Paroxysmal atrial fibrillation   Current use of long term anticoagulation   Fever of unknown origin (FUO)   Anemia, chronic disease   Malnutrition of moderate degree    FUO (fever of unknown origin) - Currently undergoing work up by [employer] and ID - chest x ray obtained and reporting cavitary lesion which was not seen on CT scan. - CT chest w/o contrast obtained and reports several enlarged nodes including left axillary  lymph nodes. Hematologist/Oncologist recommended general surgery consult for biopsy of axillary lymph node for better tissue sampling. This was done. She also underwent bone marrow biopsy. - Pathology is pending. No further episodes of fever.  Essential hypertension - Cardizem and metoprolol , spironolactone - Placed on low sodium diet.  Paroxysmal atrial fibrillation - Eliquis was held secondary to biopsy procedure. Okay to resume. - Tikosyn, Metoprolol and cardizem to continue. Heart rate is well controlled.  Anemia, chronic disease - hgb level stable  Malnutrition of moderate degree - Pt on Ensure  DVT Prophylaxis: On full anticoagulation    Code Status: Full code  Family Communication:  Discussed with the patient  Disposition Plan: Await pathology report. Mobilize.    LOS: 5 days   [facility] Pager [phone] 03/28/2015, 12:27 PM  If 7PM-7AM, please contact night-coverage at [email or web address], password [facility]

## 2015-03-28 NOTE — Progress Notes (Signed)
1 Day Post-Op  Subjective: Complains of feeling dizzy  Objective: Vital signs in last 24 hours: Temp:  [97.5 F (36.4 C)-98.5 F (36.9 C)] 98.2 F (36.8 C) (08/24 0839) Pulse Rate:  [71-96] 82 (08/24 0839) Resp:  [6-18] 18 (08/24 0839) BP: (131-157)/(74-92) 156/91 mmHg (08/24 0839) SpO2:  [92 %-99 %] 94 % (08/24 0839) Last BM Date: 03/26/15  Intake/Output from previous day: 08/23 0701 - 08/24 0700 In: 1360 [P.O.:660; I.V.:700] Out: 0  Intake/Output this shift:    Resp: clear to auscultation bilaterally Cardio: regular rate and rhythm GI: soft, non-tender; bowel sounds normal; no masses,  no organomegaly Extremities: left axillary incision looks good  Lab Results:   Recent Labs  03/27/15 0732 03/28/15 0504  WBC 6.1 6.1  HGB 9.6* 8.6*  HCT 31.7* 29.2*  PLT 536* 482*   BMET  Recent Labs  03/27/15 0732 03/28/15 0504  NA 136 134*  K 4.2 4.3  CL 96* 95*  CO2 34* 29  GLUCOSE 115* 98  BUN 12 15  CREATININE 0.76 0.74  CALCIUM 9.8 8.9   PT/INR  Recent Labs  03/26/15 0525  LABPROT 14.4  INR 1.11   ABG No results for input(s): PHART, HCO3 in the last 72 hours.  Invalid input(s): PCO2, PO2  Studies/Results: Ct Biopsy  03/26/2015   CLINICAL DATA:  Anemia, fever and chills  EXAM: CT GUIDED DEEP ILIAC BONE ASPIRATION AND CORE BIOPSY  TECHNIQUE: The procedure, risks (including but not limited to bleeding, infection, organ damage ), benefits, and alternatives were explained to the patient. Questions regarding the procedure were encouraged and answered. The patient understands and consents to the procedure. Patient was placed supine on the CT gantry and limited axial scans through the pelvis were obtained. Appropriate skin entry site was identified. Skin site was marked, prepped with Betadine, draped in usual sterile fashion, and infiltrated locally with 1% lidocaine.  Intravenous Fentanyl and Versed were administered as conscious sedation during continuous  cardiorespiratory monitoring by the radiology RN, with a total moderate sedation time of 6 minutes.  Under CT fluoroscopic guidance an 11-gauge Cook trocar bone needle was advanced into the right iliac bone just lateral to the sacroiliac joint. Once needle tip position was confirmed, coaxial core and aspiration samples were obtained. The final sample was obtained using the guiding needle itself, which was then removed. Post procedure scans show no hematoma or fracture. Patient tolerated procedure well.  COMPLICATIONS: COMPLICATIONS none  IMPRESSION: 1. Technically successful CT guided right iliac bone core and aspiration biopsy.   Electronically Signed   By: Lucrezia Europe M.D.   On: 03/26/2015 11:01    Anti-infectives: Anti-infectives    None      Assessment/Plan: s/p Procedure(s): LEFT AXILLARY LYMPH NODE BIOPSY (Left) Advance diet  Pt may resume all her normal meds Ok for discharge from surgical standpoint when medically stable.  LOS: 5 days    TOTH III,PAUL S 03/28/2015

## 2015-03-28 NOTE — Evaluation (Signed)
Physical Therapy Evaluation Patient Details Name: [name] [name] MRN: [id number] DOB: [birth year] Today's Date: 03/28/2015   History of Present Illness  Patient admitted with anemia and FUO.  Underwent lymph node biopsy and bone marrow biopsy with preliminary results suggestive of NHL.    Clinical Impression  Patient presents close to functional baseline.  Reports received iron infusion today and has friends she is lining up to help her at home.  States already has exhausted her Medicare skilled days and plans to go home and feels she can get assist from daughters, neighbors and friends.  Does not want HHPT at this time and demonstrated independence in HEP, though did caution her not to do balance exercises unless someone is there to spot her.  No further skilled PT needed, though suggest nursing or family assist to walk twice daily until d/c.    Follow Up Recommendations No PT follow up;Supervision - Intermittent    Equipment Recommendations  None recommended by PT    Recommendations for Other Services       Precautions / Restrictions Precautions Precautions: Fall      Mobility  Bed Mobility Overal bed mobility: Modified Independent                Transfers Overall transfer level: Modified independent Equipment used: Rolling walker (2 wheeled)                Ambulation/Gait Ambulation/Gait assistance: Modified independent (Device/Increase time) Ambulation Distance (Feet): 200 Feet Assistive device: Rolling walker (2 wheeled) Gait Pattern/deviations: Step-through pattern     General Gait Details: slow speed, but safe and appropriately cautious  Stairs            Wheelchair Mobility    Modified Rankin (Stroke Patients Only)       Balance Overall balance assessment: Needs assistance   Sitting balance-Leahy Scale: Good       Standing balance-Leahy Scale: Good Standing balance comment: manages independent washing hands at sink and for toilet  hygiene                             Pertinent Vitals/Pain Pain Assessment: No/denies pain    Home Living Family/patient expects to be discharged to:: Private residence Living Arrangements: Alone   Type of Home: Independent living facility Home Access: Level entry     Home Layout: One level Home Equipment: Walker - 2 wheels;Grab bars - tub/shower;Grab bars - toilet      Prior Function Level of Independence: Independent with assistive device(s)               Hand Dominance        Extremity/Trunk Assessment               Lower Extremity Assessment: Overall WFL for tasks assessed         Communication   Communication: No difficulties  Cognition Arousal/Alertness: Awake/alert Behavior During Therapy: WFL for tasks assessed/performed Overall Cognitive Status: Within Functional Limits for tasks assessed                      General Comments      Exercises Other Exercises Other Exercises: demonstrated parts of HEP she does at home to include standing heel raises, minisquats, hip abduction, and states she does balance in tandem and single limb stance      Assessment/Plan    PT Assessment Patent does not need any further PT services  PT Diagnosis Generalized weakness   PT Problem List    PT Treatment Interventions     PT Goals (Current goals can be found in the Care Plan section) Acute Rehab PT Goals PT Goal Formulation: All assessment and education complete, DC therapy    Frequency     Barriers to discharge        Co-evaluation               End of Session   Activity Tolerance: Patient tolerated treatment well Patient left: in bed;with call bell/phone within reach           Time: 1554-1620 PT Time Calculation (min) (ACUTE ONLY): 26 min   Charges:   PT Evaluation $Initial PT Evaluation Tier I: 1 Procedure PT Treatments $Gait Training: 8-22 mins   PT G Codes:        Loanne Emery,CYNDI 04-27-2015, 5:09 PM   Magda Kiel, [place] Apr 27, 2015

## 2015-03-29 LAB — CBC WITH DIFFERENTIAL/PLATELET
Basophils Absolute: 0 10*3/uL (ref 0.0–0.1)
Basophils Relative: 0 % (ref 0–1)
EOS ABS: 0.1 10*3/uL (ref 0.0–0.7)
Eosinophils Relative: 1 % (ref 0–5)
HEMATOCRIT: 31.6 % — AB (ref 36.0–46.0)
HEMOGLOBIN: 9.3 g/dL — AB (ref 12.0–15.0)
LYMPHS ABS: 0.9 10*3/uL (ref 0.7–4.0)
LYMPHS PCT: 15 % (ref 12–46)
MCH: 25.2 pg — AB (ref 26.0–34.0)
MCHC: 29.4 g/dL — ABNORMAL LOW (ref 30.0–36.0)
MCV: 85.6 fL (ref 78.0–100.0)
MONOS PCT: 9 % (ref 3–12)
Monocytes Absolute: 0.6 10*3/uL (ref 0.1–1.0)
NEUTROS ABS: 4.3 10*3/uL (ref 1.7–7.7)
NEUTROS PCT: 75 % (ref 43–77)
Platelets: 539 10*3/uL — ABNORMAL HIGH (ref 150–400)
RBC: 3.69 MIL/uL — AB (ref 3.87–5.11)
RDW: 16.6 % — ABNORMAL HIGH (ref 11.5–15.5)
WBC: 5.8 10*3/uL (ref 4.0–10.5)

## 2015-03-29 LAB — COMPREHENSIVE METABOLIC PANEL
ALK PHOS: 58 U/L (ref 38–126)
ALT: 17 U/L (ref 14–54)
ANION GAP: 10 (ref 5–15)
AST: 25 U/L (ref 15–41)
Albumin: 3.1 g/dL — ABNORMAL LOW (ref 3.5–5.0)
BILIRUBIN TOTAL: 0.4 mg/dL (ref 0.3–1.2)
BUN: 11 mg/dL (ref 6–20)
CALCIUM: 9.7 mg/dL (ref 8.9–10.3)
CO2: 32 mmol/L (ref 22–32)
CREATININE: 0.59 mg/dL (ref 0.44–1.00)
Chloride: 95 mmol/L — ABNORMAL LOW (ref 101–111)
GFR calc Af Amer: >60 mL/min (ref >60)
Glucose, Bld: 111 mg/dL — ABNORMAL HIGH (ref 65–99)
Potassium: 4.6 mmol/L (ref 3.5–5.1)
SODIUM: 137 mmol/L (ref 135–145)
TOTAL PROTEIN: 6.9 g/dL (ref 6.5–8.1)

## 2015-03-29 MED ORDER — LACTULOSE 10 GM/15ML PO SOLN
30.0000 g | Freq: Once | ORAL | Status: AC
  Administered 2015-03-29: 30 g via ORAL
  Filled 2015-03-29: qty 45

## 2015-03-29 MED ORDER — POLYETHYLENE GLYCOL 3350 17 G PO PACK
17.0000 g | PACK | Freq: Two times a day (BID) | ORAL | Status: DC
  Filled 2015-03-29: qty 1

## 2015-03-29 NOTE — Progress Notes (Signed)
Per pt, she does not want to take the hospital Symbicort.  Pt states she will call if she wants to take the symbicort.  Pt request that we don't check on her for the Symbicort treatment anymore.  She is aware that if she changes her mind, we will be glad to give her the inhaler treatment ordered.

## 2015-03-29 NOTE — Progress Notes (Signed)
[name] [name] is doing okay. She does feel a little weak. I suspect that she probably is going to need continued physical therapy.  The bone marrow report came out. There is no obvious infiltrative process in the bone marrow. There is no lymphoma. Flow cytometry does not show any type of lymphoproliferative disorder. She had the left axillary lymph node removed 2 days ago. Possibly, there will be a preliminary result today.  Her labs are not yet back today. She does not have a monoclonal spike on SPEP. She has a normal LDH.  Her C reactive protein is quite high.she has a very high sedimentation rate.  She did get iron and Aranesp. Her erythropoietin level is only 34.  Her hemoglobin has been holding pretty stable. I would have to think that chronic disease is a likely probability.  Once we get the lymph node biopsy results back, then we can figure out how we can proceed. It is possible that she could have a negative biopsy and possibly some type of granulomatous disease (i.e. Sarcoid).  She's been afebrile. Her temperature 98.her blood pressure is up a little bit. I still cannot find anything on her physical exam that is specific.  As always, we had a great  Prayer session. Her faith remains very strong.  She is very appreciative of the wonderful care that she is getting up on 6 E.  I will be out of town  Tomorrow and the weekend. If  Any input as needed, just call the on-call physician.   Ramond Dial 13:32

## 2015-03-29 NOTE — Discharge Summary (Addendum)
[facility]  Physician Discharge Summary   Patient ID: [name] [name] MRN: [id number] DOB/AGE: 01-06-1931 81 y.o.  Admit date: 03/23/2015 Discharge date: 03/29/2015  PCP: REED, TIFFANY, DO  DISCHARGE DIAGNOSES:  Principal Problem:   FUO (fever of unknown origin) Active Problems:   Essential hypertension   Paroxysmal atrial fibrillation   Current use of long term anticoagulation   Fever of unknown origin (FUO)   Anemia, chronic disease   Malnutrition of moderate degree   RECOMMENDATIONS FOR OUTPATIENT FOLLOW UP: 1. Pathology on the lymph node biopsy is still pending 2. Oncology to arrange outpatient follow-up.    DISCHARGE CONDITION: fair  Diet recommendation: Low sodium  Filed Weights   03/24/15 2133 03/26/15 2051 03/28/15 2019  Weight: 75 kg (165 lb 5.5 oz) 76.975 kg (169 lb 11.2 oz) 76.522 kg (168 lb 11.2 oz)    INITIAL HISTORY: 81 year old Caucasian female with a past medical history of atrial fibrillation on eliquis, essential hypertension, anemia, presented with fever of unknown origin. She was seen by infectious diseases. She was seen by oncology for lymphadenopathy. She underwent lymph node biopsy and bone marrow biopsy.   Consultants: Infectious diseases, oncology, general surgery  Procedures: Lymph node biopsy. Bone marrow biopsy.  Echocardiogram Study Conclusions - Left ventricle: The cavity size was normal. Wall thickness was normal. Systolic function was normal. The estimated ejection fraction was in the range of 60% to 65%. Doppler parameters are consistent with abnormal left ventricular relaxation (grade 1 diastolic dysfunction).  HOSPITAL COURSE:   FUO (fever of unknown origin)/Hodgkin's Lymphoma (see below) Patient was admitted to the hospital for further evaluation. She had previously undergone workup by infectious disease. Cultures have all been negative. She underwent bone marrow aspiration and biopsy as well as biopsy of an  enlarged lymph node. This was in the axillary area. Pathology report is pending. She hasn't had any further fever in the last 3-4 days. Initial workup included chest x-ray which showed cavitary lesions. Subsequently CT chest w/o contrast obtained and reports several enlarged nodes including left axillary lymph nodes. Hematologist/Oncologist recommended general surgery consult for biopsy of axillary lymph node for better tissue sampling. This was done as discussed earlier. Discussed with Dr. Marin Olp today. He can evaluate the patient in his office once the pathology report is available.  Essential hypertension Stable. Continue current management.  Paroxysmal atrial fibrillation Eliquis was held secondary to biopsy procedure. Okay to resume. Tikosyn, Metoprolol and cardizem to continue. Heart rate is well controlled.  Anemia, chronic disease hgb level stable  Malnutrition of moderate degree Patient was given ensure.  Discussed in detail with patient. She is agreeable for further management as outpatient, especially as she remains stable. Also discussed with the oncologist and with Dr. Megan Salon with infectious disease. Okay for discharge back to her independent living facility.  ADDENDUM Pathology report as below. She will follow up with Oncology as outpatient for further management of her Lymphoma.  "Lymph node for lymphoma, left axillary - CLASSICAL HODGKIN LYMPHOMA. - SEE ONCOLOGY TABLE. Microscopic Comment LYMPHOMA Histologic type: Classical Hodgkin lymphoma, favor mixed cellularity type. Grade (if applicable): Not applicable. Flow cytometry: No monoclonal B-cell population or abnormal T-cell phenotype identified ([id number]). Immunohistochemical stains: CD20, CD79a, LCA, CD3, CD43, CD15 and CD30 performed on block 1A with appropriate controls. Touch preps/imprints: Occasional large atypical lymphoid cells admixed with small lymphocytes, plasma cells, histiocytes and  eosinophils. Comment: The sections show effacement of the architecture by a diffuse and vaguely nodular lymphoproliferative process characterized by a relatively  minor component of large atypical mononuclear and multilobated lymphoid cells with relatively abundant amphophilic cytoplasm, vesicular chromatin, and prominent nucleoli characteristic of Reed-Sternberg cells and variants. This is seen in a polymorphous background containing small lymphocytes, plasma cells, eosinophils and histiocytes. While the architecture is vaguely nodular in many areas, significant collagenous fibrosis or lacunar cells are not appreciated. To further evaluate this process, flow cytometric analysis was performed and failed to show any monoclonal B-cell population or abnormal T-cell phenotype. Immunohistochemical stains were performed and show that the large atypical lymphoid cells are positive for CD30 and CD15 and generally negative for LCA, CD20, CD79a, CD3 and CD43. The small lymphocytes in the background show a mixture of T and B cells although there is predominance of T cells. The overall histologic and immunophenotypic feature are consistent with classical Hodgkin lymphoma, which is best classified as mixed cellularity type. (BNS:ecj 03/29/2015)"   PERTINENT LABS:  The results of significant diagnostics from this hospitalization (including imaging, microbiology, ancillary and laboratory) are listed below for reference.    Microbiology: Recent Results (from the past 240 hour(s))  Clostridium Difficile by PCR     Status: None   Collection Time: 03/21/15  2:37 PM  Result Value Ref Range Status   Toxigenic C Difficile by pcr Not Detected Not Detected Final    Comment: This test is for use only with liquid or soft stools; performance characteristics of other clinical specimen types have not been established.   This assay was performed by Cepheid GeneXpert(R) PCR. The performance characteristics of this assay  have been determined by [employer]. Performance characteristics refer to the analytical performance of the test.   Surgical pcr screen     Status: None   Collection Time: 03/26/15 10:51 PM  Result Value Ref Range Status   MRSA, PCR NEGATIVE NEGATIVE Final   Staphylococcus aureus NEGATIVE NEGATIVE Final    Comment:        The Xpert SA Assay (FDA approved for NASAL specimens in patients over 58 years of age), is one component of a comprehensive surveillance program.  Test performance has been validated by [facility] for patients greater than or equal to 52 year old. It is not intended to diagnose infection nor to guide or monitor treatment.      Labs: Basic Metabolic Panel:  Recent Labs Lab 03/23/15 1710 03/24/15 0450 03/27/15 0732 03/28/15 0504 03/29/15 0700  NA 133* 135 136 134* 137  K 4.1 4.1 4.2 4.3 4.6  CL 96* 98* 96* 95* 95*  CO2 27 28 34* 29 32  GLUCOSE 120* 100* 115* 98 111*  BUN 15 12 12 15 11   CREATININE 0.82 0.68 0.76 0.74 0.59  CALCIUM 9.6 9.0 9.8 8.9 9.7   Liver Function Tests:  Recent Labs Lab 03/23/15 1710 03/27/15 0732 03/28/15 0504 03/29/15 0700  AST 39 29 23 25   ALT 33 27 18 17   ALKPHOS 58 66 53 58  BILITOT 0.3 0.4 0.3 0.4  PROT 6.6 7.5 6.0* 6.9  ALBUMIN 2.7* 3.0* 2.6* 3.1*   CBC:  Recent Labs Lab 03/23/15 1710 03/24/15 0450 03/26/15 0525 03/27/15 0732 03/28/15 0504 03/29/15 0700  WBC 4.7 3.9* 5.5 6.1 6.1 5.8  NEUTROABS 3.2  --   --  4.4 4.3 4.3  HGB 8.0* 7.7* 8.5* 9.6* 8.6* 9.3*  HCT 25.2* 25.0* 28.4* 31.7* 29.2* 31.6*  MCV 82.4 83.3 85.0 85.0 87.2 85.6  PLT 327 336 477* 536* 482* 539*    IMAGING STUDIES Dg Chest 2  View  03/23/2015   CLINICAL DATA:  Fever for 1 day  EXAM: CHEST  2 VIEW  COMPARISON:  February 23, 2015  FINDINGS: The heart size and mediastinal contours are stable. There is a question 2 cm cavitary lesion in the right lung base. There is no focal pneumonia, pulmonary edema or pleural effusion.  Visualized skeletal structures are stable.  IMPRESSION: Question 2 cm cavitary lesion in the right lung base. Further evaluation with a chest CT is recommended.   Electronically Signed   By: Abelardo Diesel M.D.   On: 03/23/2015 20:05   Ct Chest Wo Contrast  03/24/2015   CLINICAL DATA:  Possible cavitary lesion in the right lung base on chest x-ray earlier same date. Fever of unknown origin.  EXAM: CT CHEST WITHOUT CONTRAST  TECHNIQUE: Multidetector CT imaging of the chest was performed following the standard protocol without IV contrast.  COMPARISON:  Two-view chest x-ray yesterday and earlier. CT chest 11/27/2014, 05/08/2014, 05/30/2013.  FINDINGS: Lungs: Since the most recent prior CT, worsening of airspace opacities in the peripheral left upper lobe, some of the opacities now conglomerate and masslike in appearance. Interval improvement in the airspace opacities in the peripheral right lower lobe, associated with bronchiectasis and several mucous filled dilated bronchi, associated with pleuroparenchymal scarring. Stable scar and bronchiectasis in the right middle lobe and lingula. No evidence of a right base cavitary lesion as questioned on yesterday's chest x-ray. A bleb or air cyst in the right lower lobe likely accounts for the chest x-ray finding.  Trachea/bronchi: Central airways patent with mild central bronchial wall thickening. Bronchiectasis in the right lower lobe, right middle lobe and lingula as noted above.  Pleura: Pleural thickening involving the right posterior and lateral hemithorax, some what improved when compared to the most recent prior CT. No pleural thickening on the left. No pleural effusions.  Mediastinum:  No mediastinal masses.  Heart and Vascular: Normal heart size. Moderate 3 vessel coronary atherosclerosis. No pericardial effusion. Ascending thoracic aortic aneurysm with maximum diameter approximately 4.8 cm, unchanged since the most recent CT in April, 2016, only slightly  increased since the October, 2014 examination where it measured 4.5 cm.  Lymphatic: Interval marked progression of mediastinal lymphadenopathy since the April, 2016 CT. Enlarged nodes are present in stations 2R, 3A, 4R, 5 and 7. Index low right paratracheal (4) nodes measure 3.0 x 2.4 cm. Index AP window (station 5) nodes measure 3.5 x 1.7 cm. Index subcarinal (station 7) nodes measure 1.7 x 3.6 cm. Index left upper mediastinal (station 3A) nodes measure 2.9 x 2.4 cm. Right paracardiac lymph node measures approximately 1.5 x 1.3 cm. Enlarged left axillary lymph nodes, an index node measuring 1.6 x 3.2 cm.  Other findings: None.  Visualized lower neck: Thyroid gland unremarkable. Enlarged left supraclavicular lymph nodes.  Visualized upper abdomen: Mildly enlarged retrocrural lymph nodes, increased in size since the most recent prior CT. Small gastrohepatic ligament lymph nodes, new. Visualized upper abdomen otherwise unremarkable.  Musculoskeletal: Osseous demineralization. Multilevel degenerative disc disease and spondylosis. No evidence of osseous metastatic disease.  IMPRESSION: 1. Worsening airspace opacities in the left upper lobe, now conglomerate and masslike, since the most recent prior CT from April, 2016. Opacity has been present in this region dating back to October, 2014. While this might represent an acute pneumonia, adenocarcinoma (previously known as bronchoalveolar cell carcinoma) can have this appearance, particularly given the presence over an approximate 2 year interval. 2. Pathologic lymphadenopathy involving the mediastinum, left axilla, and left  supraclavicular region with enlarging lymph nodes in the visualized upper abdomen since the April, 2016 CT. Metastatic disease or lymphoma/leukemia is suspected. 3. No evidence of cavitary lesion in the right base as questioned on yesterday's chest x-ray. A bleb or air cyst in the right lower lobe likely accounts for the chest x-ray finding. 4. Interval  improvement in the pleuroparenchymal scar in the right lower lobe since the April, 2016 CT. 5. Scar and bronchiectasis is also present in the right middle lobe and lingula, unchanged.   Electronically Signed   By: Evangeline Dakin M.D.   On: 03/24/2015 21:23   Ct Biopsy  03/26/2015   CLINICAL DATA:  Anemia, fever and chills  EXAM: CT GUIDED DEEP ILIAC BONE ASPIRATION AND CORE BIOPSY  TECHNIQUE: The procedure, risks (including but not limited to bleeding, infection, organ damage ), benefits, and alternatives were explained to the patient. Questions regarding the procedure were encouraged and answered. The patient understands and consents to the procedure. Patient was placed supine on the CT gantry and limited axial scans through the pelvis were obtained. Appropriate skin entry site was identified. Skin site was marked, prepped with Betadine, draped in usual sterile fashion, and infiltrated locally with 1% lidocaine.  Intravenous Fentanyl and Versed were administered as conscious sedation during continuous cardiorespiratory monitoring by the radiology RN, with a total moderate sedation time of 6 minutes.  Under CT fluoroscopic guidance an 11-gauge Cook trocar bone needle was advanced into the right iliac bone just lateral to the sacroiliac joint. Once needle tip position was confirmed, coaxial core and aspiration samples were obtained. The final sample was obtained using the guiding needle itself, which was then removed. Post procedure scans show no hematoma or fracture. Patient tolerated procedure well.  COMPLICATIONS: COMPLICATIONS none  IMPRESSION: 1. Technically successful CT guided right iliac bone core and aspiration biopsy.   Electronically Signed   By: Lucrezia Europe M.D.   On: 03/26/2015 11:01    DISCHARGE EXAMINATION: Filed Vitals:   03/28/15 2004 03/28/15 2019 03/29/15 0454 03/29/15 0800  BP: 148/87  164/85 117/79  Pulse: 78  78 75  Temp: 98.9 F (37.2 C)  98 F (36.7 C)   TempSrc:      Resp: 18   18 18   Height:      Weight:  76.522 kg (168 lb 11.2 oz)    SpO2: 95%  94% 94%   General appearance: alert, cooperative, appears stated age and no distress Resp: clear to auscultation bilaterally Cardio: regular rate and rhythm, S1, S2 normal, no murmur, click, rub or gallop GI: soft, non-tender; bowel sounds normal; no masses,  no organomegaly  DISPOSITION: Back to her independent living facility  Discharge Instructions    Call MD for:  extreme fatigue    Complete by:  As directed      Call MD for:  persistant dizziness or light-headedness    Complete by:  As directed      Call MD for:  persistant nausea and vomiting    Complete by:  As directed      Call MD for:  severe uncontrolled pain    Complete by:  As directed      Call MD for:  temperature >100.4    Complete by:  As directed      Diet - low sodium heart healthy    Complete by:  As directed      Discharge instructions    Complete by:  As directed   Dr. Marin Olp  will get in touch with you regarding biopsy report and follow up. Please call Dr. Megan Salon if fever recurs.  You were cared for by a hospitalist during your hospital stay. If you have any questions about your discharge medications or the care you received while you were in the hospital after you are discharged, you can call the unit and asked to speak with the hospitalist on call if the hospitalist that took care of you is not available. Once you are discharged, your primary care physician will handle any further medical issues. Please note that NO REFILLS for any discharge medications will be authorized once you are discharged, as it is imperative that you return to your primary care physician (or establish a relationship with a primary care physician if you do not have one) for your aftercare needs so that they can reassess your need for medications and monitor your lab values. If you do not have a primary care physician, you can call [phone] for a physician referral.      Increase activity slowly    Complete by:  As directed            ALLERGIES:  Allergies  Allergen Reactions  . Benicar [Olmesartan] Diarrhea and Other (See Comments)    Hypotension  . Gluten Meal Diarrhea    Intolerance  . Metronidazole Nausea Only  . Morphine Hives  . Latex Other (See Comments)    Red and irritated     Current Discharge Medication List    CONTINUE these medications which have NOT CHANGED   Details  acetaminophen (TYLENOL) 650 MG CR tablet Take 650-1,300 mg by mouth 2 (two) times daily. 1300 mg every morning and 650 mg at lunch.    albuterol (PROVENTIL HFA;VENTOLIN HFA) 108 (90 BASE) MCG/ACT inhaler Inhale 1-2 puffs into the lungs every 6 (six) hours as needed for wheezing or shortness of breath. Qty: 1 Inhaler, Refills: 3    diltiazem (CARDIZEM CD) 120 MG 24 hr capsule Take 120 mg by mouth every morning.     dofetilide (TIKOSYN) 250 MCG capsule Take 1 capsule (250 mcg total) by mouth 2 (two) times daily. Qty: 60 capsule, Refills: 12    ELIQUIS 5 MG TABS tablet Take 2.5 mg by mouth 2 (two) times daily.    glucosamine-chondroitin 500-400 MG tablet Take 1 tablet by mouth 2 (two) times daily.      metoprolol succinate (TOPROL-XL) 50 MG 24 hr tablet Take 50 mg by mouth at bedtime. Take with or immediately following a meal.    Multiple Vitamins-Minerals (MULTIVITAMIN ADULT) TABS Take 1 tablet by mouth daily.     Polyethyl Glycol-Propyl Glycol (SYSTANE OP) Place 1 drop into both eyes every morning.    Saccharomyces boulardii (FLORASTOR PO) Take 1 tablet by mouth daily.     spironolactone (ALDACTONE) 25 MG tablet Take 12.5 mg by mouth daily as needed (systolic >035).     SYMBICORT 160-4.5 MCG/ACT inhaler USE 2 PUFFS TWICE DAILY. Qty: 10.2 g, Refills: 5    ALPRAZolam (XANAX) 0.5 MG tablet Take 1 tablet (0.5 mg total) by mouth every 6 (six) hours as needed for anxiety or sleep. Qty: 120 tablet, Refills: 0    diphenhydramine-acetaminophen (TYLENOL PM)  25-500 MG TABS Take 2 tablets by mouth at bedtime.    losartan (COZAAR) 100 MG tablet Take 50 mg by mouth at bedtime as needed (Systolic >465).       STOP taking these medications     ciprofloxacin (CIPRO) 500 MG tablet  Follow-up Information    Call REED, TIFFANY, DO.   Specialty:  Geriatric Medicine   Why:  As needed   Contact information:   [address] [place] [address] [phone]       Follow up with Volanda Napoleon, MD.   Specialty:  Oncology   Why:  his office will call for follow up once biopsy report is available and he is back in town.   Contact information:   [address] [place] [address] [phone]       TOTAL DISCHARGE TIME: 46 minutes  [facility] Pager [phone]  03/29/2015, 2:10 PM

## 2015-03-29 NOTE — Care Management Important Message (Signed)
Important Message  Patient Details  Name: [name] [name] MRN: [id number] Date of Birth: [birth year]   Medicare Important Message Given:  Yes-third notification given    Delorse Lek 03/29/2015, 12:02 PM

## 2015-03-29 NOTE — Progress Notes (Signed)
Patient ID: [name] [name], female   DOB: [birth year], 81 y.o.   MRN: [id number]         [facility]    Date of Admission:  03/23/2015     ASSESSMENT: We are still waiting on final results of lymph node biopsy. She is currently afebrile. I agree with discharge home with followup at the [facility] with Dr. Marin Olp arranged in her future.  PLAN: 1. Await final pathology results 2. Discharge home today  Michel Bickers, [place] [facility] [phone] pager   [phone] cell 03/29/2015, 4:58 PM

## 2015-04-02 ENCOUNTER — Encounter: Payer: Self-pay | Admitting: Hematology & Oncology

## 2015-04-02 ENCOUNTER — Telehealth: Payer: Self-pay | Admitting: Hematology & Oncology

## 2015-04-02 ENCOUNTER — Other Ambulatory Visit: Payer: Self-pay | Admitting: Hematology & Oncology

## 2015-04-02 DIAGNOSIS — C8120 Mixed cellularity classical Hodgkin lymphoma, unspecified site: Secondary | ICD-10-CM

## 2015-04-02 HISTORY — DX: Mixed cellularity Hodgkin lymphoma, unspecified site: C81.20

## 2015-04-02 LAB — CHROMOSOME ANALYSIS, BONE MARROW

## 2015-04-02 NOTE — Telephone Encounter (Signed)
Contacted pt regarding new patient appt on 8/31. Pt confirmed appt.

## 2015-04-03 ENCOUNTER — Telehealth: Payer: Self-pay | Admitting: *Deleted

## 2015-04-03 NOTE — Telephone Encounter (Signed)
Thanks Rodena Piety.  I had seen her unfortunate results.

## 2015-04-03 NOTE — Telephone Encounter (Signed)
Patient called and stated that she was diagnosed with [facility] Lymphoma yesterday Stage 3/4 and given to the end of the year to live by Dr. Marin Olp. She stated that she doesn't want to keep going to [place] to be seen for this when she can go to the [facility] and it only takes 8 minutes. Would like to be transferred there. I told her to call Dr. Antonieta Pert office to be transferred there so they can transfer her medical records. She agreed and will give them a call.

## 2015-04-04 ENCOUNTER — Ambulatory Visit: Payer: Medicare Other

## 2015-04-04 ENCOUNTER — Ambulatory Visit: Payer: Medicare Other | Admitting: Hematology & Oncology

## 2015-04-04 ENCOUNTER — Telehealth: Payer: Self-pay | Admitting: Hematology

## 2015-04-04 ENCOUNTER — Other Ambulatory Visit: Payer: Medicare Other

## 2015-04-04 LAB — AFB CULTURE WITH SMEAR (NOT AT ARMC)
ACID FAST SMEAR: NONE SEEN
Special Requests: NORMAL

## 2015-04-04 NOTE — Telephone Encounter (Signed)
New patient appt-s/w patient and gave np appt for 09/02 @ 10:45 arrive w/Dr. Irene Limbo. Patient was seen by Dr. Marin Olp in hospital but resides in [place].  Dx-Low grade nhl

## 2015-04-06 ENCOUNTER — Ambulatory Visit (HOSPITAL_BASED_OUTPATIENT_CLINIC_OR_DEPARTMENT_OTHER): Payer: Medicare Other | Admitting: Hematology

## 2015-04-06 ENCOUNTER — Encounter: Payer: Self-pay | Admitting: Hematology

## 2015-04-06 ENCOUNTER — Other Ambulatory Visit: Payer: Self-pay | Admitting: Hematology

## 2015-04-06 ENCOUNTER — Ambulatory Visit: Admission: RE | Admit: 2015-04-06 | Payer: Medicare Other | Source: Ambulatory Visit | Admitting: Radiation Oncology

## 2015-04-06 ENCOUNTER — Ambulatory Visit (HOSPITAL_BASED_OUTPATIENT_CLINIC_OR_DEPARTMENT_OTHER): Payer: Medicare Other

## 2015-04-06 ENCOUNTER — Other Ambulatory Visit: Payer: Medicare Other

## 2015-04-06 ENCOUNTER — Ambulatory Visit: Payer: Medicare Other | Admitting: Radiation Oncology

## 2015-04-06 ENCOUNTER — Telehealth: Payer: Self-pay | Admitting: Hematology

## 2015-04-06 VITALS — BP 95/57 | HR 81 | Temp 97.9°F | Resp 18 | Ht 66.0 in | Wt 164.3 lb

## 2015-04-06 DIAGNOSIS — D649 Anemia, unspecified: Secondary | ICD-10-CM

## 2015-04-06 DIAGNOSIS — C8120 Mixed cellularity classical Hodgkin lymphoma, unspecified site: Secondary | ICD-10-CM

## 2015-04-06 DIAGNOSIS — J449 Chronic obstructive pulmonary disease, unspecified: Secondary | ICD-10-CM

## 2015-04-06 LAB — COMPREHENSIVE METABOLIC PANEL (CC13)
ALT: 16 U/L (ref 0–55)
AST: 20 U/L (ref 5–34)
Albumin: 3.2 g/dL — ABNORMAL LOW (ref 3.5–5.0)
Alkaline Phosphatase: 79 U/L (ref 40–150)
Anion Gap: 12 meq/L — ABNORMAL HIGH (ref 3–11)
BUN: 17.6 mg/dL (ref 7.0–26.0)
CO2: 26 meq/L (ref 22–29)
Calcium: 10.5 mg/dL — ABNORMAL HIGH (ref 8.4–10.4)
Chloride: 94 meq/L — ABNORMAL LOW (ref 98–109)
Creatinine: 0.8 mg/dL (ref 0.6–1.1)
EGFR: 72 ml/min/1.73 m2 — ABNORMAL LOW
Glucose: 110 mg/dL (ref 70–140)
Potassium: 4 meq/L (ref 3.5–5.1)
Sodium: 132 meq/L — ABNORMAL LOW (ref 136–145)
Total Bilirubin: 0.63 mg/dL (ref 0.20–1.20)
Total Protein: 7.9 g/dL (ref 6.4–8.3)

## 2015-04-06 LAB — CBC & DIFF AND RETIC
BASO%: 0.4 % (ref 0.0–2.0)
BASOS ABS: 0 10*3/uL (ref 0.0–0.1)
EOS ABS: 0 10*3/uL (ref 0.0–0.5)
EOS%: 0.3 % (ref 0.0–7.0)
HEMATOCRIT: 32.8 % — AB (ref 34.8–46.6)
HEMOGLOBIN: 10.4 g/dL — AB (ref 11.6–15.9)
IMMATURE RETIC FRACT: 5.4 % (ref 1.60–10.00)
LYMPH%: 5.7 % — AB (ref 14.0–49.7)
MCH: 27.1 pg (ref 25.1–34.0)
MCHC: 31.7 g/dL (ref 31.5–36.0)
MCV: 85.4 fL (ref 79.5–101.0)
MONO#: 0.7 10*3/uL (ref 0.1–0.9)
MONO%: 10.1 % (ref 0.0–14.0)
NEUT#: 6.1 10*3/uL (ref 1.5–6.5)
NEUT%: 83.5 % — AB (ref 38.4–76.8)
NRBC: 0 % (ref 0–0)
Platelets: 376 10*3/uL (ref 145–400)
RBC: 3.84 10*6/uL (ref 3.70–5.45)
RDW: 20.8 % — AB (ref 11.2–14.5)
Retic %: 1.85 % (ref 0.70–2.10)
Retic Ct Abs: 71.04 10*3/uL (ref 33.70–90.70)
WBC: 7.3 10*3/uL (ref 3.9–10.3)
lymph#: 0.4 10*3/uL — ABNORMAL LOW (ref 0.9–3.3)

## 2015-04-06 LAB — CHCC SATELLITE - SMEAR

## 2015-04-06 LAB — IRON AND TIBC CHCC
%SAT: 4 % — AB (ref 21–57)
IRON: 13 ug/dL — AB (ref 41–142)
TIBC: 279 ug/dL (ref 236–444)
UIBC: 266 ug/dL (ref 120–384)

## 2015-04-06 LAB — FERRITIN CHCC: FERRITIN: 571 ng/mL — AB (ref 9–269)

## 2015-04-06 LAB — LACTATE DEHYDROGENASE (CC13): LDH: 179 U/L (ref 125–245)

## 2015-04-06 MED ORDER — DEXAMETHASONE 2 MG PO TABS: 2.0000 mg | ORAL_TABLET | Freq: Every day | ORAL | Status: AC

## 2015-04-06 NOTE — Progress Notes (Signed)
[name] [name]    HEMATOLOGY/ONCOLOGY CONSULTATION NOTE  Date of Service: 04/06/2015  Patient Care Team: Gayland Curry, DO as PCP - General (Geriatric Medicine) [facility] Paralee Cancel, MD as Consulting Physician (Orthopedic Surgery) Jacolyn Reedy, MD as Consulting Physician (Cardiology) Royal Hawthorn, NP as Nurse Practitioner (Nurse Practitioner) Tanda Rockers, MD as Consulting Physician (Pulmonary Disease) Michel Bickers, MD as Consulting Physician (Infectious Diseases)  CHIEF COMPLAINTS/PURPOSE OF CONSULTATION:  Evaluation and management of newly diagnosed Classical Hodgkin's lymphoma mixed cellularity subtype  HISTORY OF PRESENTING ILLNESS:   [name] [name] is a wonderful 81 y.o. female who has been referred to [place] by Dr. Mariea Clonts, [facility], DO for evaluation and management of newly diagnosed classical Hodgkin's lymphoma, Mixed Cellularity subtype.  She has a history of COPD not on chronic home oxygen, hypertension, celiac disease, previous history of pneumonias, atrial fibrillation (on Eliquis) who was recently admitted to the [facility] on 03/23/2015 with several week history of fevers of unknown origin and chills. She also reported several months of progressive fatigue and weight loss of about 22 pounds over the last 6 months. Patient notes that the fatigue has been very bothersome and she feels like she cannot function at all.  In the hospital patient had extensive workup for fever of unknown origin. She has had previously undergone workups by infectious disease doctors and all the cultures have been negative.   she had a CT scan of her abdomen on 03/19/2015 to workup her fever which showed no definitive source of her fever, multiple borderline enlarged upper abdominal and retroperitoneal lymph nodes and a mildly enlarged right sided juxta pericardiac lymph node.   She subsequently had a CT scan of the chest without contrast on 03/24/2015 that showed worsening air  space opacities in the left upper lobe now masslike since the most recent prior CT in April 2016. Pathologic lymphadenopathy involving the mediastinum, left axilla and left supraclavicular region with enlarging lymph nodes in the visualized upper abdomen. Metastatic disease or leukemia/lymphoma was suspected.  Patient had a bone marrow biopsy that showed no evidence of lymphoma. She also had a biopsy of her axillary lymph node data pathology showed classical Hodgkin's lymphoma mixed cellularity.   Patient was seen by Dr. Marin Olp from oncology while hospitalized but chose to get her care here in [place] as opposed to following up his clinic in [facility]. She was referred to Dr. Learta Codding  but was scheduled with me due to lack of appointment availability.    Patient was accompanied by her daughter. We discussed in detail all the imaging and pathology findings and treatment options. We also discussed treatment limitations based on her history of significant lung disease. I spent a significant period of time trying to understand her   goals of care and wishes regarding the aggressiveness of treatment and answering a lot of her questions.    MEDICAL HISTORY:  Past Medical History  Diagnosis Date  . COPD (chronic obstructive pulmonary disease)     pft's 09/15/2005, FEV1 1.69 (76%) with ration67 and better x 14% p B2, DLC 95%  . Hypertension   . Bronchitis, chronic   . Celiac disease   . Anxiety   . Pneumonia     hx of x3  . Acute blood loss anemia 09/07/13  . Anxiety state, unspecified   . Bronchiectasis 2012  . Cerebrovascular disease, unspecified 05/26/13  . Cerebral atrophy 05/26/13  . Anemia   . Atrial fibrillation   . Aneurysm  of aorta   . Hodgkin lymphoma, mixed cellularity 04/02/2015    SURGICAL HISTORY: Past Surgical History  Procedure Laterality Date  . Nasal sinus surgery  years ago  . Total knee arthroplasty Bilateral [id number]  . Excision morton's neuroma Bilateral 35 years  ago, 2010  . Total hip arthroplasty Right 2009    Dr. Telford Nab  . Cataract extraction Bilateral ~8 years ago  . Total hip arthroplasty Left 09/06/2013    Dr. Salli Quarry  . Video bronchoscopy Bilateral 02/20/2015    Procedure: VIDEO BRONCHOSCOPY WITHOUT FLUORO;  Surgeon: Tanda Rockers, MD;  Location: [facility];  Service: Cardiopulmonary;  Laterality: Bilateral;  . Axillary lymph node biopsy Left 03/27/2015    Procedure: LEFT AXILLARY LYMPH NODE BIOPSY;  Surgeon: Autumn Messing III, MD;  Location: [place];  Service: General;  Laterality: Left;    SOCIAL HISTORY: Social History   Social History  . Marital Status: Widowed    Spouse Name: N/A  . Number of Children: N/A  . 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 Smoking status: Former Smoker -- 0.50 packs/day for 60 years    Types: Cigarettes    Quit date: 01/27/2012  . Smokeless tobacco: Never Used  . Alcohol Use: Yes     Comment: Wine and Gin daily  . Drug Use: No  . Sexual Activity: Not Currently   Other Topics Concern  . Not on file   Social History Narrative   Patient is Widowed. Retired at 81 years old. Lives in apartment,  Independent Living section at [facility] since 05/2013.   Stopped smoking 2012, moderate alcohol intake    Patient has Advanced planning documents: Living Will                   FAMILY HISTORY: Family History  Problem Relation Age of Onset  . Heart disease Father 31    heart attack  . Prostate cancer Paternal Grandfather   . Pulmonary fibrosis Brother     former smoker    ALLERGIES:  is allergic to benicar; gluten meal; metronidazole; morphine; and latex.  MEDICATIONS:  Current Outpatient Prescriptions  Medication Sig Dispense Refill  . bisacodyl (DULCOLAX) 5 MG EC tablet Take 5 mg by mouth daily.    . iron polysaccharides (NIFEREX) 150 MG capsule Take 150 mg by mouth daily.    [name] [name] senna (SENOKOT) 8.6 MG tablet Take  1 tablet by mouth daily.    [name] [name] acetaminophen (TYLENOL) 650 MG CR tablet Take 650-1,300 mg by mouth 2 (two) times daily. 1300 mg every morning and 650 mg at lunch.    . albuterol (PROVENTIL HFA;VENTOLIN HFA) 108 (90 BASE) MCG/ACT inhaler Inhale 1-2 puffs into the lungs every 6 (six) hours as needed for wheezing or shortness of breath. 1 Inhaler 3  . ALPRAZolam (XANAX) 0.25 MG tablet TAKE 1 TABLET EVERY 6 HOURS AS NEEDED FOR ANXIETY. 120 tablet 0  . ALPRAZolam (XANAX) 0.5 MG tablet Take 1 tablet (0.5 mg total) by mouth every 6 (six) hours as needed for anxiety or sleep. (Patient not taking: Reported on 03/24/2015) 120 tablet 0  . dexamethasone (DECADRON) 2 MG tablet Take 1 tablet (2 mg total) by mouth daily. 15 tablet 0  . diltiazem (CARDIZEM CD) 120 MG 24 hr capsule Take 120 mg by mouth every morning.     . diphenhydramine-acetaminophen (TYLENOL PM) 25-500 MG TABS Take 2 tablets by mouth  at bedtime.    . dofetilide (TIKOSYN) 250 MCG capsule Take 1 capsule (250 mcg total) by mouth 2 (two) times daily. 60 capsule 12  . ELIQUIS 5 MG TABS tablet Take 2.5 mg by mouth 2 (two) times daily.    [name] [name] glucosamine-chondroitin 500-400 MG tablet Take 1 tablet by mouth 2 (two) times daily.      [name] [name] losartan (COZAAR) 100 MG tablet Take 50 mg by mouth at bedtime as needed (Systolic >161).     . metoprolol succinate (TOPROL-XL) 50 MG 24 hr tablet Take 50 mg by mouth at bedtime. Take with or immediately following a meal.    . Multiple Vitamins-Minerals (MULTIVITAMIN ADULT) TABS Take 1 tablet by mouth daily.     Vladimir Faster Glycol-Propyl Glycol (SYSTANE OP) Place 1 drop into both eyes every morning.    . Saccharomyces boulardii (FLORASTOR PO) Take 1 tablet by mouth daily.     [name] [name] spironolactone (ALDACTONE) 25 MG tablet Take 12.5 mg by mouth daily as needed (systolic >096).     . SYMBICORT 160-4.5 MCG/ACT inhaler USE 2 PUFFS TWICE DAILY. 10.2 g 5   No current facility-administered medications for this visit.    REVIEW OF  SYSTEMS:    10 Point review of Systems was done is negative except as noted above.  PHYSICAL EXAMINATION: ECOG PERFORMANCE STATUS: 2 - Symptomatic, <50% confined to bed  . Filed Vitals:   04/06/15 1101  Height: 5' 6"  (1.676 m)  Weight: 164 lb 4.8 oz (74.526 kg)   Filed Weights   04/06/15 1101  Weight: 164 lb 4.8 oz (74.526 kg)   .Body mass index is 26.53 kg/(m^2).  GENERAL: Elderly lady sitting in a wheelchair in no overt distress at rest. Appears fatigued.  SKIN: no acute skin rashes , somewhat dry skin . EYES: normal, conjunctiva are pink and non-injected, sclera clear OROPHARYNX:  no exudate, no erythema and lips, buccal mucosa, and tongue normal  NECK: supple, mild JVD, no thyromegaly  LYMPH:  Small barely palpable cervical lymph nodes, left supraclavicular lymph node and left hilar lymph node palpable.  LUNGS: Bilateral fine rales, decreased breath sounds with normal respiratory effort. HEART: S1 and S2 irregular, 2 x 6 systolic murmur will aortic area ABDOMEN: abdomen soft, non-tender, normoactive bowel sounds , no palpable hepatosplenomegaly. Musculoskeletal: Trace pedal edema PSYCH: alert & oriented x 3 with fluent speech NEURO: no focal motor/sensory deficits  LABORATORY DATA:  I have reviewed the data as listed  . CBC Latest Ref Rng 04/06/2015 03/29/2015 03/28/2015  WBC 3.9 - 10.3 10e3/uL 7.3 5.8 6.1  Hemoglobin 11.6 - 15.9 g/dL 10.4(L) 9.3(L) 8.6(L)  Hematocrit 34.8 - 46.6 % 32.8(L) 31.6(L) 29.2(L)  Platelets 145 - 400 10e3/uL 376 539(H) 482(H)   . CBC    Component Value Date/Time   WBC 7.3 04/06/2015 1322   WBC 5.8 03/29/2015 0700   WBC 4.5 03/08/2015   RBC 3.84 04/06/2015 1322   RBC 3.69* 03/29/2015 0700   RBC 3.00* 03/24/2015 0450   HGB 10.4* 04/06/2015 1322   HGB 9.3* 03/29/2015 0700   HCT 32.8* 04/06/2015 1322   HCT 31.6* 03/29/2015 0700   PLT 376 04/06/2015 1322   PLT 539* 03/29/2015 0700   MCV 85.4 04/06/2015 1322   MCV 85.6 03/29/2015 0700    MCH 27.1 04/06/2015 1322   MCH 25.2* 03/29/2015 0700   MCHC 31.7 04/06/2015 1322   MCHC 29.4* 03/29/2015 0700   RDW 20.8* 04/06/2015 1322   RDW 16.6* 03/29/2015 0700  LYMPHSABS 0.4* 04/06/2015 1322   LYMPHSABS 0.9 03/29/2015 0700   MONOABS 0.7 04/06/2015 1322   MONOABS 0.6 03/29/2015 0700   EOSABS 0.0 04/06/2015 1322   EOSABS 0.1 03/29/2015 0700   BASOSABS 0.0 04/06/2015 1322   BASOSABS 0.0 03/29/2015 0700     . CMP Latest Ref Rng 04/06/2015 03/29/2015 03/28/2015  Glucose 70 - 140 mg/dl 110 111(H) 98  BUN 7.0 - 26.0 mg/dL 17.6 11 15   Creatinine 0.6 - 1.1 mg/dL 0.8 0.59 0.74  Sodium 136 - 145 mEq/L 132(L) 137 134(L)  Potassium 3.5 - 5.1 mEq/L 4.0 4.6 4.3  Chloride 101 - 111 mmol/L - 95(L) 95(L)  CO2 22 - 29 mEq/L 26 32 29  Calcium 8.4 - 10.4 mg/dL 10.5(H) 9.7 8.9  Total Protein 6.4 - 8.3 g/dL 7.9 6.9 6.0(L)  Total Bilirubin 0.20 - 1.20 mg/dL 0.63 0.4 0.3  Alkaline Phos 40 - 150 U/L 79 58 53  AST 5 - 34 U/L 20 25 23   ALT 0 - 55 U/L 16 17 18      RADIOGRAPHIC STUDIES: I have personally reviewed the radiological images as listed and agreed with the findings in the report. Dg Chest 2 View  03/23/2015   CLINICAL DATA:  Fever for 1 day  EXAM: CHEST  2 VIEW  COMPARISON:  February 23, 2015  FINDINGS: The heart size and mediastinal contours are stable. There is a question 2 cm cavitary lesion in the right lung base. There is no focal pneumonia, pulmonary edema or pleural effusion. Visualized skeletal structures are stable.  IMPRESSION: Question 2 cm cavitary lesion in the right lung base. Further evaluation with a chest CT is recommended.   Electronically Signed   By: Abelardo Diesel M.D.   On: 03/23/2015 20:05   Ct Chest Wo Contrast  03/24/2015   CLINICAL DATA:  Possible cavitary lesion in the right lung base on chest x-ray earlier same date. Fever of unknown origin.  EXAM: CT CHEST WITHOUT CONTRAST  TECHNIQUE: Multidetector CT imaging of the chest was performed following the standard protocol  without IV contrast.  COMPARISON:  Two-view chest x-ray yesterday and earlier. CT chest 11/27/2014, 05/08/2014, 05/30/2013.  FINDINGS: Lungs: Since the most recent prior CT, worsening of airspace opacities in the peripheral left upper lobe, some of the opacities now conglomerate and masslike in appearance. Interval improvement in the airspace opacities in the peripheral right lower lobe, associated with bronchiectasis and several mucous filled dilated bronchi, associated with pleuroparenchymal scarring. Stable scar and bronchiectasis in the right middle lobe and lingula. No evidence of a right base cavitary lesion as questioned on yesterday's chest x-ray. A bleb or air cyst in the right lower lobe likely accounts for the chest x-ray finding.  Trachea/bronchi: Central airways patent with mild central bronchial wall thickening. Bronchiectasis in the right lower lobe, right middle lobe and lingula as noted above.  Pleura: Pleural thickening involving the right posterior and lateral hemithorax, some what improved when compared to the most recent prior CT. No pleural thickening on the left. No pleural effusions.  Mediastinum:  No mediastinal masses.  Heart and Vascular: Normal heart size. Moderate 3 vessel coronary atherosclerosis. No pericardial effusion. Ascending thoracic aortic aneurysm with maximum diameter approximately 4.8 cm, unchanged since the most recent CT in April, 2016, only slightly increased since the October, 2014 examination where it measured 4.5 cm.  Lymphatic: Interval marked progression of mediastinal lymphadenopathy since the April, 2016 CT. Enlarged nodes are present in stations 2R, 3A, 4R, 5 and  7. Index low right paratracheal (4) nodes measure 3.0 x 2.4 cm. Index AP window (station 5) nodes measure 3.5 x 1.7 cm. Index subcarinal (station 7) nodes measure 1.7 x 3.6 cm. Index left upper mediastinal (station 3A) nodes measure 2.9 x 2.4 cm. Right paracardiac lymph node measures approximately 1.5 x  1.3 cm. Enlarged left axillary lymph nodes, an index node measuring 1.6 x 3.2 cm.  Other findings: None.  Visualized lower neck: Thyroid gland unremarkable. Enlarged left supraclavicular lymph nodes.  Visualized upper abdomen: Mildly enlarged retrocrural lymph nodes, increased in size since the most recent prior CT. Small gastrohepatic ligament lymph nodes, new. Visualized upper abdomen otherwise unremarkable.  Musculoskeletal: Osseous demineralization. Multilevel degenerative disc disease and spondylosis. No evidence of osseous metastatic disease.  IMPRESSION: 1. Worsening airspace opacities in the left upper lobe, now conglomerate and masslike, since the most recent prior CT from April, 2016. Opacity has been present in this region dating back to October, 2014. While this might represent an acute pneumonia, adenocarcinoma (previously known as bronchoalveolar cell carcinoma) can have this appearance, particularly given the presence over an approximate 2 year interval. 2. Pathologic lymphadenopathy involving the mediastinum, left axilla, and left supraclavicular region with enlarging lymph nodes in the visualized upper abdomen since the April, 2016 CT. Metastatic disease or lymphoma/leukemia is suspected. 3. No evidence of cavitary lesion in the right base as questioned on yesterday's chest x-ray. A bleb or air cyst in the right lower lobe likely accounts for the chest x-ray finding. 4. Interval improvement in the pleuroparenchymal scar in the right lower lobe since the April, 2016 CT. 5. Scar and bronchiectasis is also present in the right middle lobe and lingula, unchanged.   Electronically Signed   By: Evangeline Dakin M.D.   On: 03/24/2015 21:23   Ct Abdomen Pelvis W Contrast  03/19/2015   CLINICAL DATA:  81 year old female with fever of unknown origin for several months. Sweats and chills.  EXAM: CT ABDOMEN AND PELVIS WITH CONTRAST  TECHNIQUE: Multidetector CT imaging of the abdomen and pelvis was performed  using the standard protocol following bolus administration of intravenous contrast.  CONTRAST:  138m ISOVUE-300 IOPAMIDOL (ISOVUE-300) INJECTION 61%  COMPARISON:  No priors.  FINDINGS: Lower chest: Bronchial wall thickening with peribronchovascular micro and macronodularity and architectural distortion in the periphery of the right lower lobe, similar to prior examination 11/27/2014 presumably related to chronic infection such is MAI. Enlarged juxta pericardiac lymph node immediately adjacent to the right atrium measuring 15 mm in short axis, slightly enlarged compared to the prior examination.  Hepatobiliary: No cystic or solid hepatic lesions. No intra or extrahepatic biliary ductal dilatation. Gallbladder is normal in appearance.  Pancreas: No pancreatic mass. No pancreatic ductal dilatation. No pancreatic or peripancreatic fluid or inflammatory changes.  Spleen: Unremarkable.  Adrenals/Urinary Tract: Bilateral adrenal glands are normal in appearance. Sub cm low-attenuation lesions in the kidneys bilaterally too small to definitively characterize, but are statistically likely to represent small cysts. 1.8 cm well-defined low-attenuation lesion in the lower pole of the left kidney is compatible with a simple cyst. No hydroureteronephrosis to indicate urinary tract obstruction at this time. The urinary bladder is nearly completely obscured by extensive beam hardening artifact from the patient's bilateral total hip arthroplasties.  Stomach/Bowel: The appearance of the stomach is normal. No pathologic dilatation of small bowel or colon. Numerous colonic diverticulae are noted. In the well visualized portions of the abdomen and pelvis (much of the pelvis is obscured by beam hardening artifact)  there are no surrounding inflammatory changes to suggest an acute diverticulitis at this time. While the proximal aspect of the appendix is normal in appearance, the distal aspect appears mildly dilated and thickened, measuring  up to 10 mm in diameter. No overt surrounding inflammatory changes are noted.  Vascular/Lymphatic: Atherosclerosis throughout the abdominal and pelvic vasculature, without evidence of aneurysm or dissection. Multiple borderline enlarged retroperitoneal and upper abdominal lymph nodes are noted measuring up to 8 mm in short axis, but are nonspecific.  Reproductive: Much of the pelvis is obscured by beam hardening artifacts, accurate evaluation of uterus and ovaries is not possible on today's examination.  Other: No significant volume of ascites.  No pneumoperitoneum.  Musculoskeletal: Chronic appearing burst fracture of L3 with approximately 70% loss of central vertebral body height and 8 mm are retropulsion of fracture fragments impinging upon the central spinal canal. 7 mm of anterolisthesis of L4 upon L5. Status post bilateral total hip arthroplasty. There are no aggressive appearing lytic or blastic lesions noted in the visualized portions of the skeleton.  IMPRESSION: 1. No definite source for fever is confidently identified on today's examination. However, the distal aspect of the appendix appears mildly enlarged and thickened (10 mm in diameter), such that the possibility of chronic appendicitis should be considered. No overt inflammatory changes around the appendix at this time to suggest an acute appendicitis. 2. Multiple borderline enlarged upper abdominal and retroperitoneal lymph nodes, and a mildly enlarged right-sided juxta pericardiac lymph node. These findings are nonspecific, and the lower thoracic lymph node may be reactive related to chronic pulmonary infection, which may also be a source for fevers and chills. 3. Colonic diverticulosis without evidence of acute diverticulitis at this time. 4. Additional incidental findings, as above.   Electronically Signed   By: Vinnie Langton M.D.   On: 03/19/2015 14:54   Ct Biopsy  03/26/2015   CLINICAL DATA:  Anemia, fever and chills  EXAM: CT GUIDED DEEP  ILIAC BONE ASPIRATION AND CORE BIOPSY  TECHNIQUE: The procedure, risks (including but not limited to bleeding, infection, organ damage ), benefits, and alternatives were explained to the patient. Questions regarding the procedure were encouraged and answered. The patient understands and consents to the procedure. Patient was placed supine on the CT gantry and limited axial scans through the pelvis were obtained. Appropriate skin entry site was identified. Skin site was marked, prepped with Betadine, draped in usual sterile fashion, and infiltrated locally with 1% lidocaine.  Intravenous Fentanyl and Versed were administered as conscious sedation during continuous cardiorespiratory monitoring by the radiology RN, with a total moderate sedation time of 6 minutes.  Under CT fluoroscopic guidance an 11-gauge Cook trocar bone needle was advanced into the right iliac bone just lateral to the sacroiliac joint. Once needle tip position was confirmed, coaxial core and aspiration samples were obtained. The final sample was obtained using the guiding needle itself, which was then removed. Post procedure scans show no hematoma or fracture. Patient tolerated procedure well.  COMPLICATIONS: COMPLICATIONS none  IMPRESSION: 1. Technically successful CT guided right iliac bone core and aspiration biopsy.   Electronically Signed   By: Lucrezia Europe M.D.   On: 03/26/2015 11:01   ECHO 03/28/2015: -------------------------------------------------------------------  ------------------------------------------------------------------- LV EF: 60% -  65%  ------------------------------------------------------------------- Indications:   Atrial fibrillation - 427.31.  ------------------------------------------------------------------- History:  PMH: Abdominal aortic aneurysm. Chronic obstructive pulmonary disease. Risk factors: Hypertension.  ------------------------------------------------------------------- Study  Conclusions  - Left ventricle: The cavity size was normal. Wall thickness  was normal. Systolic function was normal. The estimated ejection fraction was in the range of 60% to 65%. Doppler parameters are consistent with abnormal left ventricular relaxation (grade 1 diastolic dysfunction).   PFTS (pending) ordered  PET/CT (pending) ordered.     ASSESSMENT & PLAN:   Ms Caicedo is a 81 year old Caucasian female with multiple medical comorbidities including COPD, diastolic CHF with preserved ejection fraction with new diagnosis of   #1 Stage III-IVB Hodgkin's lymphoma mixed cellularity with significant constitutional symptoms in the form of high grade fevers and chills or drenching night sweats and 22 pound weight loss. Patient appears to have significant fatigue. She has COPD that is not oxygen dependent but bothersome and would likely not be a good candidate for bleomycin.  Sedimentation rate is elevated in the 85-100 range as expected. LDH is within normal limits as expected. #2 normocytic normochromic anemia likely related to her underlying Hodgkin's lymphoma. #3 COPD not oxygen dependent but with associated bronchiectasis and multiple pneumonias in the past. #4 Plan -I discussed with the patient and her daughter in great details regarding the diagnosis, prognosis, treatment options and treatment decision making. -We spent about half an hour going over the NCCN guidelines and I discussed how that her lung function might cause some treatment restrictions with regards to one of the frontline medications called bleomycin. - She certainly has a more than average risk of infections given her bronchiectasis and recurrent pneumonias. -We discussed that A(B)VD is typically the frontline treatment option but that I would recommend doing this without the bleomycin and with appropriate dose adjustments so that she might tolerate this. We would adjust treatments based on tolerance and bone  marrow reserves. -We discussed that this condition is potentially curable if her performance status allows for reasonable treatment. -Other parallel treatment options might include the use of mini-CHOP. -If she wanted to pursue predominantly palliative treatments might consider the use of single agent bendamustine by the response rates and chance of CR would likely be much lower to the treatment might be better tolerated. -Patient will need a PET CT scan for complete staging -Pulmonary function testing to get a baseline though I would not use bleomycin in her case since it could make her oxygen-dependent and seriously affect her quality of life. -Though she has limited risk factors for HIV would get an HIV test with next labs given her pathologic subtype of Hodgkin's lymphoma. -Patient has been significantly bothered by constitutional symptoms related to Hodgkin's lymphoma and was prescribed dexamethasone 2 mg daily to help with these pending definitive treatment. She was recommended to go up to 4 mg of dexamethasone daily if the 2 mg was tolerated and continue this for about 7-10 days spending definitive treatment. -Patient and her daughter showed interest in trying to begin treatment soon given that the Hodgkin's lymphoma constitutional symptoms are significantly affecting her quality of life and causing debilitating fatigue.  -Patient and her daughter want to think about the options and about whether they want to follow up with me or Dr. Blair Promise as previously planned. -The PET/CT scan pulmonary function testing additional labs chemotherapy counseling and tentative plan for chemotherapy was requested to try to move things along.  All of the patients and daughters questions were answered to their apparent satisfaction. The patient knows to call the clinic with any problems, questions or concerns.  I spent 60 minutes counseling the patient face to face. The total time spent in the appointment was 80  minutes and more than  50% was on counseling and direct patient cares.    Sullivan Lone MD [place] AAHIVMS [facility] Hematology/Oncology Physician [facility]  (Office):       [phone] (Work cell):  [phone] (Fax):           [phone]  04/06/2015 11:41 AM

## 2015-04-06 NOTE — Telephone Encounter (Signed)
Gave adn prnted appt sched and avs for pt for sept

## 2015-04-07 LAB — SEDIMENTATION RATE: Sed Rate: 85 mm/hr — ABNORMAL HIGH (ref 0–30)

## 2015-04-10 ENCOUNTER — Ambulatory Visit (HOSPITAL_COMMUNITY)
Admission: RE | Admit: 2015-04-10 | Discharge: 2015-04-10 | Disposition: A | Discharge status: Home / Self Care | Payer: Medicare Other | Source: Ambulatory Visit | Attending: Hematology | Admitting: Hematology

## 2015-04-10 ENCOUNTER — Other Ambulatory Visit: Payer: Self-pay | Admitting: Internal Medicine

## 2015-04-10 ENCOUNTER — Telehealth: Payer: Self-pay | Admitting: Hematology

## 2015-04-10 ENCOUNTER — Other Ambulatory Visit: Payer: Self-pay | Admitting: *Deleted

## 2015-04-10 DIAGNOSIS — C8120 Mixed cellularity classical Hodgkin lymphoma, unspecified site: Secondary | ICD-10-CM | POA: Diagnosis not present

## 2015-04-10 LAB — PULMONARY FUNCTION TEST
DL/VA % pred: 70 %
DL/VA: 3.61 ml/min/mmHg/L
DLCO UNC % PRED: 44 %
DLCO UNC: 12.54 ml/min/mmHg
FEF 25-75 PRE: 0.67 L/s
FEF 25-75 Post: 0.67 L/sec
FEF2575-%Change-Post: 0 %
FEF2575-%PRED-PRE: 45 %
FEF2575-%Pred-Post: 44 %
FEV1-%Change-Post: 0 %
FEV1-%PRED-POST: 60 %
FEV1-%Pred-Pre: 60 %
FEV1-POST: 1.3 L
FEV1-Pre: 1.3 L
FEV1FVC-%CHANGE-POST: 3 %
FEV1FVC-%Pred-Pre: 87 %
FEV6-%CHANGE-POST: 0 %
FEV6-%PRED-POST: 71 %
FEV6-%PRED-PRE: 71 %
FEV6-PRE: 1.98 L
FEV6-Post: 1.97 L
FEV6FVC-%CHANGE-POST: 2 %
FEV6FVC-%PRED-POST: 104 %
FEV6FVC-%PRED-PRE: 101 %
FVC-%CHANGE-POST: -2 %
FVC-%Pred-Post: 68 %
FVC-%Pred-Pre: 70 %
FVC-Post: 1.99 L
FVC-Pre: 2.05 L
POST FEV6/FVC RATIO: 99 %
PRE FEV1/FVC RATIO: 64 %
Post FEV1/FVC ratio: 66 %
Pre FEV6/FVC Ratio: 97 %
RV % PRED: 109 %
RV: 2.84 L
TLC % PRED: 89 %
TLC: 4.94 L

## 2015-04-10 MED ORDER — ALPRAZOLAM 0.25 MG PO TABS: ORAL_TABLET | ORAL | Status: DC

## 2015-04-10 MED ORDER — ALBUTEROL SULFATE (2.5 MG/3ML) 0.083% IN NEBU
2.5000 mg | INHALATION_SOLUTION | Freq: Once | RESPIRATORY_TRACT | Status: AC
  Administered 2015-04-10: 2.5 mg via RESPIRATORY_TRACT

## 2015-04-10 NOTE — Telephone Encounter (Signed)
pt ok and aware of all appts.Marland KitchenMarland KitchenMarland Kitchen

## 2015-04-11 ENCOUNTER — Telehealth: Payer: Self-pay | Admitting: *Deleted

## 2015-04-11 ENCOUNTER — Other Ambulatory Visit: Payer: Self-pay | Admitting: *Deleted

## 2015-04-11 NOTE — Telephone Encounter (Signed)
Patient called stating that she would like to switch providers to MD [facility]. Currently a MD [facility] patient. Message to MD Jamie Kato

## 2015-04-11 NOTE — Progress Notes (Signed)
Spoke to patient and patient daughter regarding request to switch care to Dr. Benay Spice.  Per Dr. Irene Limbo, it would not be ideal to start patient on chemo under his care and then switch to Dr. Benay Spice since he may potentially have a different opinion of plan of care.  Dr. Benay Spice can not see patient until 9/21.  Pt states she would prefer to wait for Dr. Gearldine Shown opinion despite the wait period.  POF sent.  Pt to keep appointment for PET scan and labs this Friday, all other appointments with Dr. Irene Limbo to be cancelled.

## 2015-04-11 NOTE — Progress Notes (Signed)
Addendum  Mrs. [name] was scheduled for her PET CT scan, labs, chemotherapy counseling, clinic visit with me and infusion center appointment for 04/13/2015. Her daughter called me around noon 04/11/2015 mentioning that it might be too many things for her mom in one day and that she would like to move the chemotherapy for early next week. I talked my nurse and had the chemotherapy changed for the following week.  She also mentioned that the dexamethasone has really helped to resolve [name] [name]'s fever and some of her fatigue and there has been decrease in her night sweats as well. We talked about increasing the dexamethasone from 2 mg to 4 mg daily daily times she saw me in clinic and got definitive therapy. We talked about the fact that the do not want exceed 1-2 weeks of steroids for risk of steroid-induced myopathy.  Patient's daughter subsequently called scheduling to change her appointment from mine to an appointment with [name] [name]. She is informed that he might not have availability ~21st of September at the earliest and that he recommended continuing her current appointment with me. Patient's daughter is adamant about switching cares. She inquired if she could start treatment and then switch cares. We informed her that typically people get a second opinion prior to starting a chemotherapy regimen but if she wanted to we could start her on treatment as per my recommendations.  Patient's daughter eventually decided to get a PET/CT scan and hold off on labs, chemotherapy counseling and weight for her appointment with [name] [name]. [name] and his nurse have been informed. Patient and her daughter are aware of the potential adverse effects of disease progression in the interim. She has our clinic number and other contact information if any other questions or concerns arise. Patient and daughter were wished the best of cares and luck.   Will defer further evaluation and management to [name] [name] unless  patient or her daughter want help from [place] in any way.   I shall forward my assessment and opinion to [name] [name].  [name] Lone MD [place] AAHIVMS [facility] Hematology/Oncology Physician [place]  (Office):       [phone] (Work cell):  [phone] (Fax):           [phone]

## 2015-04-11 NOTE — Telephone Encounter (Signed)
Per staff message from desk RN I have moved appt from 9/9 to 9/12. patietn to receive schedule at chemo class

## 2015-04-12 ENCOUNTER — Encounter: Payer: Self-pay | Admitting: *Deleted

## 2015-04-13 ENCOUNTER — Ambulatory Visit: Payer: Medicare Other

## 2015-04-13 ENCOUNTER — Other Ambulatory Visit: Payer: Medicare Other

## 2015-04-13 ENCOUNTER — Encounter (HOSPITAL_COMMUNITY)
Admission: RE | Admit: 2015-04-13 | Discharge: 2015-04-13 | Disposition: A | Discharge status: Home / Self Care | Payer: Medicare Other | Source: Ambulatory Visit | Attending: Hematology | Admitting: Hematology

## 2015-04-13 ENCOUNTER — Ambulatory Visit: Payer: Medicare Other | Admitting: Hematology

## 2015-04-13 DIAGNOSIS — I712 Thoracic aortic aneurysm, without rupture: Secondary | ICD-10-CM | POA: Diagnosis not present

## 2015-04-13 DIAGNOSIS — C8120 Mixed cellularity classical Hodgkin lymphoma, unspecified site: Secondary | ICD-10-CM

## 2015-04-13 LAB — GLUCOSE, CAPILLARY: GLUCOSE-CAPILLARY: 98 mg/dL (ref 65–99)

## 2015-04-13 MED ORDER — FLUDEOXYGLUCOSE F - 18 (FDG) INJECTION
8.0000 | Freq: Once | INTRAVENOUS | Status: DC | PRN
  Administered 2015-04-13: 8 via INTRAVENOUS
  Filled 2015-04-13: qty 8

## 2015-04-16 ENCOUNTER — Ambulatory Visit: Payer: Medicare Other

## 2015-04-16 ENCOUNTER — Encounter: Payer: Self-pay | Admitting: Oncology

## 2015-04-16 NOTE — Progress Notes (Signed)
Contacted pt via phone to introduce myself as Development worker, community and asked pt if she had any financial questions or concerns. Pt states she has dual coverage and did not have any needs at this time.

## 2015-04-19 ENCOUNTER — Ambulatory Visit (HOSPITAL_COMMUNITY): Payer: Medicare Other

## 2015-04-20 ENCOUNTER — Ambulatory Visit: Payer: Medicare Other | Admitting: Hematology

## 2015-04-25 ENCOUNTER — Telehealth: Payer: Self-pay | Admitting: Oncology

## 2015-04-25 ENCOUNTER — Ambulatory Visit (HOSPITAL_BASED_OUTPATIENT_CLINIC_OR_DEPARTMENT_OTHER): Payer: Medicare Other | Admitting: Oncology

## 2015-04-25 ENCOUNTER — Encounter: Payer: Self-pay | Admitting: Oncology

## 2015-04-25 ENCOUNTER — Telehealth: Payer: Self-pay | Admitting: *Deleted

## 2015-04-25 VITALS — BP 111/72 | HR 56 | Temp 98.2°F | Resp 18 | Ht 66.0 in | Wt 166.0 lb

## 2015-04-25 DIAGNOSIS — C8120 Mixed cellularity classical Hodgkin lymphoma, unspecified site: Secondary | ICD-10-CM | POA: Diagnosis not present

## 2015-04-25 NOTE — Progress Notes (Signed)
1600-Received call from [place] in IR asking if Dr. Benay Spice is okay with holding pt's Eliquis x 48 hrs since he is not the prescribing physician for this medication. Dr. Benay Spice in agreement to hold Eliquis x 48 hrs for port placement; Stacy made aware.

## 2015-04-25 NOTE — Telephone Encounter (Signed)
Per staff phone call and POF I have schedueld appts. Scheduler advised of appts.  JMW  

## 2015-04-25 NOTE — Progress Notes (Signed)
[name] New Patient Consult   Referring MD: Gayland Curry, [address] [place], [place] [address]   [name] 81 y.o.  [birth year]    Reason for Referral: Hodgkin's lymphoma   HPI: She reports night sweats for the past several months with associated fever and chills. She reports fever up to 103. She was referred to Dr. Megan Salon. A CT of the chest on 03/24/2015 revealed marked progression of me as to how lymphadenopathy compared to a CT from April 2016. There were enlarged left axillary lymph nodes and worsening airspace opacity in peripheral left upper lobe with masslike appearance. Improvement is noted in airspace opacities in the peripheral right lower lobe with associated bronchiectasis.  She was admitted to the hospital and noted to be anemic. A bone marrow biopsy 03/26/2015 revealed a hypercellular bone marrow with pain and myeloid hyperplasia. No evidence of a lymphoproliferative process.  On 03/27/2015 she underwent a left axillary lymph node biopsy by Dr. Marlou Starks. The pathology returned consistent with classical Hodgkin's lymphoma-mixed cellularity type.  A staging PET scan 04/13/2015 confirmed extensive hypermetabolic activity in the left neck, chest, and upper abdomen. There is also hypermobile airspace disease and left upper lobe suspicious for involvement by lymphoma.  She was referred to Dr. Irene Limbo and he recommended systemic chemotherapy. She requested another opinion. She was started on Decadron and this has resulted in resolution of the fever and sweats. She has been maintained on Decadron at a dose of 1 mg daily since seeing Dr. Irene Limbo on 04/06/2015.    Past Medical History  Diagnosis Date  . COPD (chronic obstructive pulmonary disease)     pft's 09/15/2005, FEV1 1.69 (76%) with ration67 and better x 14% p B2, DLC 95%  . Hypertension   . Bronchitis, chronic   . Celiac disease   . Anxiety   . Pneumonia     hx of x3  . Acute blood loss anemia  09/07/13  . Anxiety state, unspecified   . Bronchiectasis 2012  . Cerebrovascular disease, unspecified 05/26/13  . Cerebral atrophy 05/26/13  . Anemia   . Atrial fibrillation   . Aneurysm of aorta   . Hodgkin lymphoma, mixed cellularity 04/02/2015    Past Surgical History  Procedure Laterality Date  . Nasal sinus surgery  years ago  . Total knee arthroplasty Bilateral [id number]  . Excision morton's neuroma Bilateral 35 years ago, 2010  . Total hip arthroplasty Right 2009    Dr. Telford Nab  . Cataract extraction Bilateral ~8 years ago  . Total hip arthroplasty Left 09/06/2013    Dr. Salli Quarry  . Video bronchoscopy Bilateral 02/20/2015    Procedure: VIDEO BRONCHOSCOPY WITHOUT FLUORO;  Surgeon: Tanda Rockers, MD;  Location: [facility];  Service: Cardiopulmonary;  Laterality: Bilateral;  . Axillary lymph node biopsy Left 03/27/2015    Procedure: LEFT AXILLARY LYMPH NODE BIOPSY;  Surgeon: Autumn Messing III, MD;  Location: [name];  Service: General;  Laterality: Left;    Medications: Reviewed  Allergies:  Allergies  Allergen Reactions  . Benicar [Olmesartan] Diarrhea and Other (See Comments)    Hypotension  . Gluten Meal Diarrhea    Intolerance  . Metronidazole Nausea Only  . Morphine Hives  . Latex Other (See Comments)    Red and irritated    Family history: Her grandfather had prostate cancer.  Social History:   She lives at [place]. She smokes cigarettes 50 years and quit 5 years ago. She drinks wine occasionally.  She has worked as a Pharmacist, hospital, in an Artist, and for [employer].  History  Alcohol Use  . Yes    Comment: Wine and Gin daily    History  Smoking status  . Former Smoker -- 0.50 packs/day for 60 years  . Types: Cigarettes  . Quit date: 01/27/2012  Smokeless tobacco  . Never Used      ROS:   Positives include: Insomnia, 25 pound weight loss, constipation when taking iron, "dizziness "since starting Decadron, fever/night sweats prior to beginning Decadron,  malaise  A complete ROS was otherwise negative.  Physical Exam:  Blood pressure 111/72, pulse 56, temperature 98.2 F (36.8 C), temperature source Oral, resp. rate 18, height 5' 6"  (1.676 m), weight 166 lb (75.297 kg), SpO2 99 %.  HEENT: No thrush, oral cavity without visible mass, neck without mass Lungs: Inspiratory rhonchi at the right posterior base, the lungs are otherwise clear, no respiratory distress Cardiac: Regular rate and rhythm Abdomen: No hepatomegaly, no mass, nontender  Vascular: No leg edema Lymph nodes: No cervical, supraclavicular, axillary, or inguinal nodes. Soft fullness at the surgical site in the left axilla without a discrete palpable lymph node Neurologic: Alert and oriented, the motor exam appears intact in the upper and lower extremities Skin: No rash Musculoskeletal: No spine tenderness   LAB:  CBC  Lab Results  Component Value Date   WBC 7.3 04/06/2015   HGB 10.4* 04/06/2015   HCT 32.8* 04/06/2015   MCV 85.4 04/06/2015   PLT 376 04/06/2015   NEUTROABS 6.1 04/06/2015   reveals slight sedimentation rate on 04/06/1999 16-85  CMP      Component Value Date/Time   NA 132* 04/06/2015 1322   NA 137 03/29/2015 0700   NA 137 03/06/2015   K 4.0 04/06/2015 1322   K 4.6 03/29/2015 0700   CL 95* 03/29/2015 0700   CO2 26 04/06/2015 1322   CO2 32 03/29/2015 0700   GLUCOSE 110 04/06/2015 1322   GLUCOSE 111* 03/29/2015 0700   BUN 17.6 04/06/2015 1322   BUN 11 03/29/2015 0700   BUN 22* 03/06/2015   CREATININE 0.8 04/06/2015 1322   CREATININE 0.59 03/29/2015 0700   CREATININE 0.8 03/06/2015   CALCIUM 10.5* 04/06/2015 1322   CALCIUM 9.7 03/29/2015 0700   PROT 7.9 04/06/2015 1322   PROT 6.9 03/29/2015 0700   ALBUMIN 3.2* 04/06/2015 1322   ALBUMIN 3.1* 03/29/2015 0700   AST 20 04/06/2015 1322   AST 25 03/29/2015 0700   ALT 16 04/06/2015 1322   ALT 17 03/29/2015 0700   ALKPHOS 79 04/06/2015 1322   ALKPHOS 58 03/29/2015 0700   BILITOT 0.63  04/06/2015 1322   BILITOT 0.4 03/29/2015 0700   GFRNONAA >60 03/29/2015 0700   GFRAA >60 03/29/2015 0700    No results found for: CEA  Imaging:  I reviewed the CT chest 03/24/2015 and PET 04/13/2015 images with [name] and her daughter   Assessment/Plan:   1. Hodgkin's lymphoma-mixed cellularity type, clinical stage IVb  Staging PET scan 04/13/2015 consistent with hypermetabolic lymphadenopathy in the neck, chest, and upper abdomen. There is masslike hypermetabolic activity in the left lung, probable lymphoma lesion in the left issue him and right hepatic lobe. 2. Fever/chills/night sweats secondary to #1, improved with Decadron  3.   Anemia secondary to chronic disease  4.    COPD  5.    History of fibrillation     Disposition:   Ms. Menon has been diagnosed with advanced stage  Hodgkin's lymphoma. I discussed the prognosis and treatment options at length with Ms. Farrier and her daughter. I recommend systemic chemotherapy. We discussed some of the potential salvage systemic treatment options including rituximab and other immunotherapies. We discussed "standard "ABVD or AVD chemotherapy. Ms. Rothlisberger has multiple comorbid conditions that may make it difficult for her to tolerate ABVD. She indicated quality of life is important to her. I recommend CVPP therapy. This regimen includes oral treatment with chlorambucil, procarbazine, and prednisone in addition to IV vinblastine. She understands the clinical response and "cure "rates may be lower with this regimen compared to ABVD, but I expect this to be well tolerated.  She has poor peripheral IV access and will be referred for placement of a Port-A-Cath.  I reviewed the potential toxicities associated with the CVPP regimen including the chance for nausea, mucositis, alopecia, rash, allergic reaction, hematologic toxicity, and secondary myelodysplasia/leukemia. We discussed the vesicant property and neuropathy associated with  vinblastine. She agrees to proceed. She will attend a chemotherapy teaching class.  The plan is to begin a first cycle of chemotherapy on 05/02/2015.   Approximately 50 minutes were spent with the patient today. The majority of the time was used for counseling and coordination of care.  [place], [place] 04/25/2015, 1:49 PM

## 2015-04-25 NOTE — Telephone Encounter (Signed)
Gave and printed appt sched and avs for pt for Sept and OCT °

## 2015-04-26 ENCOUNTER — Other Ambulatory Visit: Payer: Medicare Other

## 2015-04-26 ENCOUNTER — Other Ambulatory Visit: Payer: Self-pay | Admitting: Oncology

## 2015-04-26 ENCOUNTER — Other Ambulatory Visit (HOSPITAL_BASED_OUTPATIENT_CLINIC_OR_DEPARTMENT_OTHER): Payer: Medicare Other

## 2015-04-26 ENCOUNTER — Other Ambulatory Visit: Payer: Self-pay | Admitting: *Deleted

## 2015-04-26 DIAGNOSIS — C8120 Mixed cellularity classical Hodgkin lymphoma, unspecified site: Secondary | ICD-10-CM | POA: Diagnosis not present

## 2015-04-26 LAB — CBC WITH DIFFERENTIAL/PLATELET
BASO%: 0.3 % (ref 0.0–2.0)
Basophils Absolute: 0 10*3/uL (ref 0.0–0.1)
EOS ABS: 0 10*3/uL (ref 0.0–0.5)
EOS%: 0 % (ref 0.0–7.0)
HEMATOCRIT: 36.1 % (ref 34.8–46.6)
HEMOGLOBIN: 11.3 g/dL — AB (ref 11.6–15.9)
LYMPH#: 0.5 10*3/uL — AB (ref 0.9–3.3)
LYMPH%: 5.1 % — ABNORMAL LOW (ref 14.0–49.7)
MCH: 27 pg (ref 25.1–34.0)
MCHC: 31.3 g/dL — ABNORMAL LOW (ref 31.5–36.0)
MCV: 86.3 fL (ref 79.5–101.0)
MONO#: 0.6 10*3/uL (ref 0.1–0.9)
MONO%: 5.9 % (ref 0.0–14.0)
NEUT%: 88.7 % — ABNORMAL HIGH (ref 38.4–76.8)
NEUTROS ABS: 8.8 10*3/uL — AB (ref 1.5–6.5)
Platelets: 355 10*3/uL (ref 145–400)
RBC: 4.18 10*6/uL (ref 3.70–5.45)
RDW: 25 % — AB (ref 11.2–14.5)
WBC: 9.9 10*3/uL (ref 3.9–10.3)

## 2015-04-26 LAB — COMPREHENSIVE METABOLIC PANEL (CC13)
ALBUMIN: 3.6 g/dL (ref 3.5–5.0)
ALK PHOS: 64 U/L (ref 40–150)
ALT: 27 U/L (ref 0–55)
AST: 21 U/L (ref 5–34)
Anion Gap: 7 mEq/L (ref 3–11)
BILIRUBIN TOTAL: 0.42 mg/dL (ref 0.20–1.20)
BUN: 36.4 mg/dL — AB (ref 7.0–26.0)
CO2: 29 mEq/L (ref 22–29)
CREATININE: 0.8 mg/dL (ref 0.6–1.1)
Calcium: 9.5 mg/dL (ref 8.4–10.4)
Chloride: 93 mEq/L — ABNORMAL LOW (ref 98–109)
EGFR: 67 mL/min/{1.73_m2} — ABNORMAL LOW (ref >90)
GLUCOSE: 96 mg/dL (ref 70–140)
Potassium: 5.6 mEq/L — ABNORMAL HIGH (ref 3.5–5.1)
SODIUM: 129 meq/L — AB (ref 136–145)
TOTAL PROTEIN: 7.2 g/dL (ref 6.4–8.3)

## 2015-04-26 MED ORDER — CHLORAMBUCIL 2 MG PO TABS: ORAL_TABLET | ORAL | Status: DC

## 2015-04-26 MED ORDER — LIDOCAINE-PRILOCAINE 2.5-2.5 % EX CREA: TOPICAL_CREAM | CUTANEOUS | Status: AC

## 2015-04-26 MED ORDER — PROCARBAZINE HCL 50 MG PO CAPS: ORAL_CAPSULE | ORAL | Status: DC

## 2015-04-26 MED ORDER — PREDNISONE 50 MG PO TABS: ORAL_TABLET | ORAL | Status: DC

## 2015-04-26 MED ORDER — PROCHLORPERAZINE MALEATE 5 MG PO TABS: 5.0000 mg | ORAL_TABLET | Freq: Four times a day (QID) | ORAL | Status: DC | PRN

## 2015-04-27 ENCOUNTER — Other Ambulatory Visit: Payer: Self-pay | Admitting: Physician Assistant

## 2015-04-27 ENCOUNTER — Other Ambulatory Visit: Payer: Self-pay | Admitting: *Deleted

## 2015-04-27 LAB — SEDIMENTATION RATE: Sed Rate: 10 mm/hr (ref 0–30)

## 2015-04-27 LAB — HIV ANTIBODY (ROUTINE TESTING W REFLEX): HIV: NONREACTIVE

## 2015-04-27 MED ORDER — ONDANSETRON HCL 8 MG PO TABS: 8.0000 mg | ORAL_TABLET | Freq: Three times a day (TID) | ORAL | Status: DC | PRN

## 2015-04-29 ENCOUNTER — Other Ambulatory Visit: Payer: Self-pay | Admitting: Oncology

## 2015-04-30 ENCOUNTER — Ambulatory Visit (HOSPITAL_COMMUNITY)
Admission: RE | Admit: 2015-04-30 | Discharge: 2015-04-30 | Disposition: A | Discharge status: Home / Self Care | Payer: Medicare Other | Source: Ambulatory Visit | Attending: Oncology | Admitting: Oncology

## 2015-04-30 ENCOUNTER — Telehealth: Payer: Self-pay | Admitting: *Deleted

## 2015-04-30 ENCOUNTER — Other Ambulatory Visit: Payer: Self-pay | Admitting: Oncology

## 2015-04-30 ENCOUNTER — Encounter: Payer: Self-pay | Admitting: Oncology

## 2015-04-30 ENCOUNTER — Encounter (HOSPITAL_COMMUNITY): Payer: Self-pay

## 2015-04-30 DIAGNOSIS — C8190 Hodgkin lymphoma, unspecified, unspecified site: Secondary | ICD-10-CM | POA: Diagnosis present

## 2015-04-30 DIAGNOSIS — C8198 Hodgkin lymphoma, unspecified, lymph nodes of multiple sites: Secondary | ICD-10-CM | POA: Diagnosis not present

## 2015-04-30 DIAGNOSIS — C8120 Mixed cellularity classical Hodgkin lymphoma, unspecified site: Secondary | ICD-10-CM

## 2015-04-30 DIAGNOSIS — Z7901 Long term (current) use of anticoagulants: Secondary | ICD-10-CM | POA: Insufficient documentation

## 2015-04-30 LAB — CBC
HCT: 36 % (ref 36.0–46.0)
Hemoglobin: 11.1 g/dL — ABNORMAL LOW (ref 12.0–15.0)
MCH: 27.6 pg (ref 26.0–34.0)
MCHC: 30.8 g/dL (ref 30.0–36.0)
MCV: 89.6 fL (ref 78.0–100.0)
PLATELETS: 193 10*3/uL (ref 150–400)
RBC: 4.02 MIL/uL (ref 3.87–5.11)
RDW: 22.6 % — AB (ref 11.5–15.5)
WBC: 8.1 10*3/uL (ref 4.0–10.5)

## 2015-04-30 LAB — PROTIME-INR
INR: 0.93 (ref 0.00–1.49)
Prothrombin Time: 12.7 seconds (ref 11.6–15.2)

## 2015-04-30 LAB — APTT: aPTT: 25 seconds (ref 24–37)

## 2015-04-30 MED ORDER — CEFAZOLIN SODIUM-DEXTROSE 2-3 GM-% IV SOLR
INTRAVENOUS | Status: AC
  Filled 2015-04-30: qty 50

## 2015-04-30 MED ORDER — SODIUM CHLORIDE 0.9 % IV SOLN
INTRAVENOUS | Status: DC
  Administered 2015-04-30: 11:00:00 via INTRAVENOUS

## 2015-04-30 MED ORDER — LIDOCAINE-EPINEPHRINE 2 %-1:100000 IJ SOLN
INTRAMUSCULAR | Status: AC
  Filled 2015-04-30: qty 1

## 2015-04-30 MED ORDER — MIDAZOLAM HCL 2 MG/2ML IJ SOLN
INTRAMUSCULAR | Status: AC
  Filled 2015-04-30: qty 6

## 2015-04-30 MED ORDER — LIDOCAINE HCL 1 % IJ SOLN
INTRAMUSCULAR | Status: AC
  Filled 2015-04-30: qty 20

## 2015-04-30 MED ORDER — CEFAZOLIN SODIUM-DEXTROSE 2-3 GM-% IV SOLR
2.0000 g | Freq: Once | INTRAVENOUS | Status: AC
  Administered 2015-04-30: 2 g via INTRAVENOUS

## 2015-04-30 MED ORDER — FENTANYL CITRATE (PF) 100 MCG/2ML IJ SOLN
INTRAMUSCULAR | Status: AC | PRN
  Administered 2015-04-30: 50 ug via INTRAVENOUS

## 2015-04-30 MED ORDER — FENTANYL CITRATE (PF) 100 MCG/2ML IJ SOLN
INTRAMUSCULAR | Status: AC
  Filled 2015-04-30: qty 4

## 2015-04-30 MED ORDER — MIDAZOLAM HCL 2 MG/2ML IJ SOLN
INTRAMUSCULAR | Status: AC | PRN
  Administered 2015-04-30 (×2): 1 mg via INTRAVENOUS

## 2015-04-30 MED ORDER — HEPARIN SOD (PORK) LOCK FLUSH 100 UNIT/ML IV SOLN
INTRAVENOUS | Status: AC
  Filled 2015-04-30: qty 5

## 2015-04-30 MED ORDER — HEPARIN SOD (PORK) LOCK FLUSH 100 UNIT/ML IV SOLN
INTRAVENOUS | Status: AC | PRN
  Administered 2015-04-30: 500 [IU]

## 2015-04-30 NOTE — Procedures (Signed)
R IJ Port cathter placement with US and fluoroscopy No complication No blood loss. See complete dictation in [facility] PACS.  

## 2015-04-30 NOTE — Telephone Encounter (Signed)
Called pt's daughter, [name]. Informed her that [name] can only be obtained via mail order from [employer]. [name] was sent to [facility]. [name] is ready for pick up at [facility]. She voiced understanding.  [facility] to get [facility] (on hold 13 min.) , will call back this afternoon to follow up on prescription.

## 2015-04-30 NOTE — Progress Notes (Signed)
Patient ID: [name], female   DOB: 07/31/1931, 81 y.o.   MRN: [id number]    Referring Physician(s): Sherrill,Gary B  Chief Complaint:  lymphoma  Subjective:  Pt familiar to IR service form prior BM biopsy on 03/26/15. She has known stage IV B Hodgkin's lymphoma and presents today for port a cath placement for chemotherapy. She denies current fevers, HA,CP, cough, abd/back pain,N/V or abnormal bleeding. She does have some occ dyspnea.   Allergies: Benicar; Gluten meal; Metronidazole; Morphine; and Latex  Medications: Prior to Admission medications   Medication Sig Start Date End Date Taking? Authorizing Provider  acetaminophen (TYLENOL) 650 MG CR tablet Take 650-1,300 mg by mouth 2 (two) times daily. 1300 mg every morning and 650 mg at lunch.   Yes Historical Provider, MD  ALPRAZolam (XANAX) 0.25 MG tablet TAKE 1 TABLET EVERY 6 HOURS AS NEEDED FOR ANXIETY. 04/10/15  Yes Estill Dooms, MD  ALPRAZolam Duanne Moron) 0.5 MG tablet Take 1 tablet (0.5 mg total) by mouth every 6 (six) hours as needed for anxiety or sleep. 02/07/15  Yes Tiffany L Reed, DO  dexamethasone (DECADRON) 2 MG tablet Take 1 tablet (2 mg total) by mouth daily. 04/06/15  Yes Brunetta Genera, MD  diltiazem (CARDIZEM CD) 120 MG 24 hr capsule Take 120 mg by mouth every morning.  05/05/14  Yes Historical Provider, MD  diphenhydramine-acetaminophen (TYLENOL PM) 25-500 MG TABS Take 2 tablets by mouth at bedtime.   Yes Historical Provider, MD  dofetilide (TIKOSYN) 250 MCG capsule Take 1 capsule (250 mcg total) by mouth 2 (two) times daily. 05/10/14  Yes Jacolyn Reedy, MD  glucosamine-chondroitin 500-400 MG tablet Take 1 tablet by mouth 2 (two) times daily.     Yes Historical Provider, MD  losartan (COZAAR) 100 MG tablet Take 50 mg by mouth at bedtime as needed (Systolic >062).    Yes Historical Provider, MD  metoprolol succinate (TOPROL-XL) 50 MG 24 hr tablet Take 50 mg by mouth at bedtime. Take with or immediately following a  meal.   Yes Historical Provider, MD  Multiple Vitamins-Minerals (MULTIVITAMIN ADULT) TABS Take 1 tablet by mouth daily. [place] Women Everyday   Yes Historical Provider, MD  Polyethyl Glycol-Propyl Glycol (SYSTANE OP) Place 1 drop into both eyes every morning.   Yes Historical Provider, MD  Saccharomyces boulardii (FLORASTOR PO) Take 1 tablet by mouth every morning.    Yes Historical Provider, MD  senna (SENOKOT) 8.6 MG tablet Take 1 tablet by mouth daily.   Yes Historical Provider, MD  spironolactone (ALDACTONE) 25 MG tablet Take 12.5 mg by mouth daily as needed (systolic >376--EG).  12/26/14  Yes Historical Provider, MD  SYMBICORT 160-4.5 MCG/ACT inhaler USE 2 PUFFS TWICE DAILY. 03/13/15  Yes Tiffany L Reed, DO  albuterol (PROVENTIL HFA;VENTOLIN HFA) 108 (90 BASE) MCG/ACT inhaler Inhale 1-2 puffs into the lungs every 6 (six) hours as needed for wheezing or shortness of breath. 08/14/14   Tanda Rockers, MD  chlorambucil (LEUKERAN) 2 MG tablet Take 4 tabs (8mg ) daily for 14 day Starting 05/02/15  Give on an empty stomach 1 hour before or 2 hours after meals. 04/26/15   Ladell Pier, MD  ELIQUIS 5 MG TABS tablet Take 2.5 mg by mouth 2 (two) times daily. 12/26/14   Historical Provider, MD  lidocaine-prilocaine (EMLA) cream Place small amount over port area 1-2 hours prior to treatment and cover with plastic wrap.  DO NOT RUB IN 04/26/15   Ladell Pier, MD  ondansetron (ZOFRAN) 8 MG tablet Take 1 tablet (8 mg total) by mouth every 8 (eight) hours as needed for nausea or vomiting. 04/27/15   Ladell Pier, MD  predniSONE (DELTASONE) 50 MG tablet Take 1 tablet by mouth daily for 14 days.  Starting 05/02/15 04/26/15   Ladell Pier, MD  procarbazine (MATULANE) 50 MG capsule Take 3 tabs (150mg ) daily for 14 days starting 05/02/15  Follow low tyramine diet. 04/26/15   Ladell Pier, MD     Vital Signs: BP 129/90 mmHg  Pulse 49  Temp(Src) 98.4 F (36.9 C) (Oral)  Resp 18  Ht 5\' 6"  (1.676 m)  Wt  166 lb (75.297 kg)  BMI 26.81 kg/m2  SpO2 100%  Physical Exam  Constitutional: She is oriented to person, place, and time. She appears well-developed and well-nourished.  Cardiovascular: Regular rhythm.   bradycardic  Pulmonary/Chest: Effort normal and breath sounds normal.  Abdominal: Soft. Bowel sounds are normal. There is no tenderness.  Musculoskeletal: Normal range of motion. She exhibits no edema.  Neurological: She is alert and oriented to person, place, and time.    Imaging: No results found.  Labs:  CBC:  Recent Labs  03/28/15 0504 03/29/15 0700 04/06/15 1322 04/26/15 1144  WBC 6.1 5.8 7.3 9.9  HGB 8.6* 9.3* 10.4* 11.3*  HCT 29.2* 31.6* 32.8* 36.1  PLT 482* 539* 376 355    COAGS:  Recent Labs  03/26/15 0525  INR 1.11    BMP:  Recent Labs  03/24/15 0450 03/27/15 0732 03/28/15 0504 03/29/15 0700 04/06/15 1322 04/26/15 1144  NA 135 136 134* 137 132* 129*  K 4.1 4.2 4.3 4.6 4.0 5.6*  CL 98* 96* 95* 95*  --   --   CO2 28 34* 29 32 26 29  GLUCOSE 100* 115* 98 111* 110 96  BUN 12 12 15 11  17.6 36.4*  CALCIUM 9.0 9.8 8.9 9.7 10.5* 9.5  CREATININE 0.68 0.76 0.74 0.59 0.8 0.8  GFRNONAA >60 >60 >60 >60  --   --   GFRAA >60 >60 >60 >60  --   --     LIVER FUNCTION TESTS:  Recent Labs  03/28/15 0504 03/29/15 0700 04/06/15 1322 04/26/15 1144  BILITOT 0.3 0.4 0.63 0.42  AST 23 25 20 21   ALT 18 17 16 27   ALKPHOS 53 58 79 64  PROT 6.0* 6.9 7.9 7.2  ALBUMIN 2.6* 3.1* 3.2* 3.6    Assessment and Plan: Pt with newly diagnosed stage IVB Hodgkin's lymphoma. Plan is for port a cath placement today for chemotherapy. Risks and benefits discussed with the patient/daughter including, but not limited to bleeding, infection, pneumothorax, or fibrin sheath development and need for additional procedures.All of the patient's questions were answered, patient is agreeable to proceed.Consent signed and in chart.     Signed: D. Rowe Robert 04/30/2015, 11:02  AM   I spent a total of 15 minutes at the the patient's bedside AND on the patient's hospital floor or unit, greater than 50% of which was counseling/coordinating care for port a cath placement

## 2015-04-30 NOTE — Telephone Encounter (Signed)
Voicemail from patient requesting if Chlorambucil and Procarbazine are prior authorized and ready at the [facility].  Going to have port-a-cath placed at 10:20.  Daughter Marc Morgans will pick thewe up and can be reached via mobile number [phone].

## 2015-04-30 NOTE — Discharge Instructions (Signed)
Implanted Port Insertion, Care After °Refer to this sheet in the next few weeks. These instructions provide you with information on caring for yourself after your procedure. Your health care provider may also give you more specific instructions. Your treatment has been planned according to current medical practices, but problems sometimes occur. Call your health care provider if you have any problems or questions after your procedure. °WHAT TO EXPECT AFTER THE PROCEDURE °After your procedure, it is typical to have the following:  °· Discomfort at the port insertion site. Ice packs to the area will help. °· Bruising on the skin over the port. This will subside in 3-4 days. °HOME CARE INSTRUCTIONS °· After your port is placed, you will get a manufacturer's information card. The card has information about your port. Keep this card with you at all times.   °· Know what kind of port you have. There are many types of ports available.   °· Wear a medical alert bracelet in case of an emergency. This can help alert health care workers that you have a port.   °· The port can stay in for as long as your health care provider believes it is necessary.   °· A home health care nurse may give medicines and take care of the port.   °· You or a family member can get special training and directions for giving medicine and taking care of the port at home.   °SEEK MEDICAL CARE IF:  °· Your port does not flush or you are unable to get a blood return.   °· You have a fever or chills. °SEEK IMMEDIATE MEDICAL CARE IF: °· You have new fluid or pus coming from your incision.   °· You notice a bad smell coming from your incision site.   °· You have swelling, pain, or more redness at the incision or port site.   °· You have chest pain or shortness of breath. °Document Released: 05/11/2013 Document Revised: 07/26/2013 Document Reviewed: 05/11/2013 °ExitCare® Patient Information ©2015 [employer]. This information is not intended to replace  advice given to you by your health care provider. Make sure you discuss any questions you have with your health care provider. °Implanted Port Home Guide °An implanted port is a type of central line that is placed under the skin. Central lines are used to provide IV access when treatment or nutrition needs to be given through a person's veins. Implanted ports are used for long-term IV access. An implanted port may be placed because:  °· You need IV medicine that would be irritating to the small veins in your hands or arms.   °· You need long-term IV medicines, such as antibiotics.   °· You need IV nutrition for a long period.   °· You need frequent blood draws for lab tests.   °· You need dialysis.   °Implanted ports are usually placed in the chest area, but they can also be placed in the upper arm, the abdomen, or the leg. An implanted port has two main parts:  °· Reservoir. The reservoir is round and will appear as a small, raised area under your skin. The reservoir is the part where a needle is inserted to give medicines or draw blood.   °· Catheter. The catheter is a thin, flexible tube that extends from the reservoir. The catheter is placed into a large vein. Medicine that is inserted into the reservoir goes into the catheter and then into the vein.   °HOW WILL I CARE FOR MY INCISION SITE? °Do not get the incision site wet. Bathe or   shower as directed by your health care provider.  °HOW IS MY PORT ACCESSED? °Special steps must be taken to access the port:  °· Before the port is accessed, a numbing cream can be placed on the skin. This helps numb the skin over the port site.   °· Your health care provider uses a sterile technique to access the port. °· Your health care provider must put on a mask and sterile gloves. °· The skin over your port is cleaned carefully with an antiseptic and allowed to dry. °· The port is gently pinched between sterile gloves, and a needle is inserted into the port. °· Only  "non-coring" port needles should be used to access the port. Once the port is accessed, a blood return should be checked. This helps ensure that the port is in the vein and is not clogged.   °· If your port needs to remain accessed for a constant infusion, a clear (transparent) bandage will be placed over the needle site. The bandage and needle will need to be changed every week, or as directed by your health care provider.   °· Keep the bandage covering the needle clean and dry. Do not get it wet. Follow your health care provider's instructions on how to take a shower or bath while the port is accessed.   °· If your port does not need to stay accessed, no bandage is needed over the port.   °WHAT IS FLUSHING? °Flushing helps keep the port from getting clogged. Follow your health care provider's instructions on how and when to flush the port. Ports are usually flushed with saline solution or a medicine called heparin. The need for flushing will depend on how the port is used.  °· If the port is used for intermittent medicines or blood draws, the port will need to be flushed:   °· After medicines have been given.   °· After blood has been drawn.   °· As part of routine maintenance.   °· If a constant infusion is running, the port may not need to be flushed.   °HOW LONG WILL MY PORT STAY IMPLANTED? °The port can stay in for as long as your health care provider thinks it is needed. When it is time for the port to come out, surgery will be done to remove it. The procedure is similar to the one performed when the port was put in.  °WHEN SHOULD I SEEK IMMEDIATE MEDICAL CARE? °When you have an implanted port, you should seek immediate medical care if:  °· You notice a bad smell coming from the incision site.   °· You have swelling, redness, or drainage at the incision site.   °· You have more swelling or pain at the port site or the surrounding area.   °· You have a fever that is not controlled with medicine. °Document  Released: 07/21/2005 Document Revised: 05/11/2013 Document Reviewed: 03/28/2013 °ExitCare® Patient Information ©2015 [employer]. This information is not intended to replace advice given to you by your health care provider. Make sure you discuss any questions you have with your health care provider.Conscious Sedation, Adult, Care After °Refer to this sheet in the next few weeks. These instructions provide you with information on caring for yourself after your procedure. Your health care provider may also give you more specific instructions. Your treatment has been planned according to current medical practices, but problems sometimes occur. Call your health care provider if you have any problems or questions after your procedure. °WHAT TO EXPECT AFTER THE PROCEDURE  °After your procedure: °·   You may feel sleepy, clumsy, and have poor balance for several hours. °· Vomiting may occur if you eat too soon after the procedure. °HOME CARE INSTRUCTIONS °· Do not participate in any activities where you could become injured for at least 24 hours. Do not: °¨ Drive. °¨ Swim. °¨ Ride a bicycle. °¨ Operate heavy machinery. °¨ Cook. °¨ Use power tools. °¨ Climb ladders. °¨ Work from a high place. °· Do not make important decisions or sign legal documents until you are improved. °· If you vomit, drink water, juice, or soup when you can drink without vomiting. Make sure you have little or no nausea before eating solid foods. °· Only take over-the-counter or prescription medicines for pain, discomfort, or fever as directed by your health care provider. °· Make sure you and your family fully understand everything about the medicines given to you, including what side effects may occur. °· You should not drink alcohol, take sleeping pills, or take medicines that cause drowsiness for at least 24 hours. °· If you smoke, do not smoke without supervision. °· If you are feeling better, you may resume normal activities 24 hours after you  were sedated. °· Keep all appointments with your health care provider. °SEEK MEDICAL CARE IF: °· Your skin is pale or bluish in color. °· You continue to feel nauseous or vomit. °· Your pain is getting worse and is not helped by medicine. °· You have bleeding or swelling. °· You are still sleepy or feeling clumsy after 24 hours. °SEEK IMMEDIATE MEDICAL CARE IF: °· You develop a rash. °· You have difficulty breathing. °· You develop any type of allergic problem. °· You have a fever. °MAKE SURE YOU: °· Understand these instructions. °· Will watch your condition. °· Will get help right away if you are not doing well or get worse. °Document Released: 05/11/2013 Document Reviewed: 05/11/2013 °ExitCare® Patient Information ©2015 [employer]. This information is not intended to replace advice given to you by your health care provider. Make sure you discuss any questions you have with your health care provider. ° °

## 2015-04-30 NOTE — Progress Notes (Signed)
I faxed matulane req to [facility] phar [phone]

## 2015-05-01 ENCOUNTER — Encounter: Payer: Self-pay | Admitting: Oncology

## 2015-05-01 ENCOUNTER — Telehealth: Payer: Self-pay | Admitting: *Deleted

## 2015-05-01 NOTE — Telephone Encounter (Signed)
Spoke with pt: Per Dr. Benay Spice: Begin treatment 9/28 as scheduled. Pt will start Prednisone, Leukeran and Velban as prescribed. Pt to begin Matulane when it arrives. Will stop Matulane after Day 14 dose as planned (Prednisone and Leukeran is also taken for 14 days). Matulane will be decreased by the number of days it is delayed. Pt voiced understanding and appreciation for clarification. Will need reinforcement of teaching. Has Zofran antiemetic to take as needed. Reviewed EMLA cream instructions. Pt was able to feel port, will apply cream to port only and avoid incision.

## 2015-05-01 NOTE — Progress Notes (Signed)
I refaxed to [facility]. See prev notes

## 2015-05-02 ENCOUNTER — Ambulatory Visit (HOSPITAL_BASED_OUTPATIENT_CLINIC_OR_DEPARTMENT_OTHER): Payer: Medicare Other

## 2015-05-02 ENCOUNTER — Telehealth: Payer: Self-pay

## 2015-05-02 VITALS — BP 126/73 | HR 70 | Temp 97.0°F | Resp 19

## 2015-05-02 DIAGNOSIS — C8120 Mixed cellularity classical Hodgkin lymphoma, unspecified site: Secondary | ICD-10-CM

## 2015-05-02 DIAGNOSIS — Z5111 Encounter for antineoplastic chemotherapy: Secondary | ICD-10-CM

## 2015-05-02 MED ORDER — HEPARIN SOD (PORK) LOCK FLUSH 100 UNIT/ML IV SOLN
500.0000 [IU] | Freq: Once | INTRAVENOUS | Status: AC | PRN
  Administered 2015-05-02: 500 [IU]
  Filled 2015-05-02: qty 5

## 2015-05-02 MED ORDER — SODIUM CHLORIDE 0.9 % IV SOLN
4.0000 mg/m2 | Freq: Once | INTRAVENOUS | Status: AC
  Administered 2015-05-02: 7.5 mg via INTRAVENOUS
  Filled 2015-05-02: qty 7.5

## 2015-05-02 MED ORDER — SODIUM CHLORIDE 0.9 % IJ SOLN
10.0000 mL | INTRAMUSCULAR | Status: DC | PRN
  Administered 2015-05-02: 10 mL
  Filled 2015-05-02: qty 10

## 2015-05-02 MED ORDER — SODIUM CHLORIDE 0.9 % IV SOLN
Freq: Once | INTRAVENOUS | Status: AC
  Administered 2015-05-02: 14:00:00 via INTRAVENOUS

## 2015-05-02 MED ORDER — ONDANSETRON HCL 40 MG/20ML IJ SOLN
Freq: Once | INTRAMUSCULAR | Status: AC
  Administered 2015-05-02: 14:00:00 via INTRAVENOUS
  Filled 2015-05-02: qty 4

## 2015-05-02 NOTE — Telephone Encounter (Signed)
Pt called about tylenol use while on chemo.  Advised patient Tylenol recommendation is there due to concern it will mask a fever.  Advised patient tylenol for arthritis pain is fine.  Advised pt to request flu shot when she is in next week.  Pt voiced understanding.

## 2015-05-02 NOTE — Patient Instructions (Addendum)
[facility] Discharge Instructions for Patients Receiving Chemotherapy  Today you received the following chemotherapy agents Vinblastine. Continue Prednisone every morning for 14 days. Take Leukeran (4 tablets) daily for 14 days. Begin Procarbazine when you receive it. Call the office when you receive this medication.   To help prevent nausea and vomiting after your treatment, we encourage you to take your nausea medication: Zofran. Take one every 8 hours as needed.   If you develop nausea and vomiting that is not controlled by your nausea medication, call the clinic.   BELOW ARE SYMPTOMS THAT SHOULD BE REPORTED IMMEDIATELY:  *FEVER GREATER THAN 100.5 F  *CHILLS WITH OR WITHOUT FEVER  NAUSEA AND VOMITING THAT IS NOT CONTROLLED WITH YOUR NAUSEA MEDICATION  *UNUSUAL SHORTNESS OF BREATH  *UNUSUAL BRUISING OR BLEEDING  TENDERNESS IN MOUTH AND THROAT WITH OR WITHOUT PRESENCE OF ULCERS  *URINARY PROBLEMS  *BOWEL PROBLEMS  UNUSUAL RASH Items with * indicate a potential emergency and should be followed up as soon as possible.  Feel free to call the clinic you have any questions or concerns. The clinic phone number is [phone].  Please show the [place] at check-in to the Emergency Department and triage nurse.  Vinblastine injection What is this medicine? VINBLASTINE (vin BLAS teen) is a chemotherapy drug. It slows the growth of cancer cells. This medicine is used to treat many types of cancer like breast cancer, testicular cancer, Hodgkin's disease, non-Hodgkin's lymphoma, and sarcoma. This medicine may be used for other purposes; ask your health care provider or pharmacist if you have questions. COMMON BRAND NAME(S): Velban What should I tell my health care provider before I take this medicine? They need to know if you have any of these conditions: -blood disorders -dental disease -gout -infection (especially a virus infection such as chickenpox, cold  sores, or herpes) -liver disease -lung disease -nervous system disease -recent or ongoing radiation therapy -an unusual or allergic reaction to vinblastine, other chemotherapy agents, other medicines, foods, dyes, or preservatives -pregnant or trying to get pregnant -breast-feeding How should I use this medicine? This drug is given as an infusion into a vein. It is administered in a hospital or clinic by a specially trained health care professional. If you have pain, swelling, burning or any unusual feeling around the site of your injection, tell your health care professional right away. Talk to your pediatrician regarding the use of this medicine in children. While this drug may be prescribed for selected conditions, precautions do apply. Overdosage: If you think you have taken too much of this medicine contact a poison control center or emergency room at once. NOTE: This medicine is only for you. Do not share this medicine with others. What if I miss a dose? It is important not to miss your dose. Call your doctor or health care professional if you are unable to keep an appointment. What may interact with this medicine? Do not take this medicine with any of the following medications: -erythromycin -itraconazole -mibefradil -voriconazole This medicine may also interact with the following medications: -cyclosporine -fluconazole -ketoconazole -medicines for seizures like phenytoin -medicines to increase blood counts like filgrastim, pegfilgrastim, sargramostim -vaccines -verapamil Talk to your doctor or health care professional before taking any of these medicines: -acetaminophen -aspirin -ibuprofen -ketoprofen -naproxen This list may not describe all possible interactions. Give your health care provider a list of all the medicines, herbs, non-prescription drugs, or dietary supplements you use. Also tell them if you smoke, drink alcohol,  or use illegal drugs. Some items may interact  with your medicine. What should I watch for while using this medicine? Your condition will be monitored carefully while you are receiving this medicine. You will need important blood work done while you are taking this medicine. This drug may make you feel generally unwell. This is not uncommon, as chemotherapy can affect healthy cells as well as cancer cells. Report any side effects. Continue your course of treatment even though you feel ill unless your doctor tells you to stop. In some cases, you may be given additional medicines to help with side effects. Follow all directions for their use. Call your doctor or health care professional for advice if you get a fever, chills or sore throat, or other symptoms of a cold or flu. Do not treat yourself. This drug decreases your body's ability to fight infections. Try to avoid being around people who are sick. This medicine may increase your risk to bruise or bleed. Call your doctor or health care professional if you notice any unusual bleeding. Be careful brushing and flossing your teeth or using a toothpick because you may get an infection or bleed more easily. If you have any dental work done, tell your dentist you are receiving this medicine. Avoid taking products that contain aspirin, acetaminophen, ibuprofen, naproxen, or ketoprofen unless instructed by your doctor. These medicines may hide a fever. Do not become pregnant while taking this medicine. Women should inform their doctor if they wish to become pregnant or think they might be pregnant. There is a potential for serious side effects to an unborn child. Talk to your health care professional or pharmacist for more information. Do not breast-feed an infant while taking this medicine. Men may have a lower sperm count while taking this medicine. Talk to your doctor if you plan to father a child. What side effects may I notice from receiving this medicine? Side effects that you should report to your  doctor or health care professional as soon as possible: -allergic reactions like skin rash, itching or hives, swelling of the face, lips, or tongue -low blood counts - This drug may decrease the number of white blood cells, red blood cells and platelets. You may be at increased risk for infections and bleeding. -signs of infection - fever or chills, cough, sore throat, pain or difficulty passing urine -signs of decreased platelets or bleeding - bruising, pinpoint red spots on the skin, black, tarry stools, nosebleeds -signs of decreased red blood cells - unusually weak or tired, fainting spells, lightheadedness -breathing problems -changes in hearing -change in the amount of urine -chest pain -high blood pressure -mouth sores -nausea and vomiting -pain, swelling, redness or irritation at the injection site -pain, tingling, numbness in the hands or feet -problems with balance, dizziness -seizures Side effects that usually do not require medical attention (report to your doctor or health care professional if they continue or are bothersome): -constipation -hair loss -jaw pain -loss of appetite -sensitivity to light -stomach pain -tumor pain This list may not describe all possible side effects. Call your doctor for medical advice about side effects. You may report side effects to FDA at 1-800-FDA-1088. Where should I keep my medicine? This drug is given in a hospital or clinic and will not be stored at home. NOTE: This sheet is a summary. It may not cover all possible information. If you have questions about this medicine, talk to your doctor, pharmacist, or health care provider.  2015, Elsevier/Gold Standard. (  2008-04-17 17:15:59)  

## 2015-05-07 ENCOUNTER — Telehealth: Payer: Self-pay | Admitting: *Deleted

## 2015-05-07 NOTE — Telephone Encounter (Signed)
MAY PT. EAT BREAKFAST BEFORE HER LAB APPOINTMENT? INFORMED PT. IT IS OK TO EAT BREAKFAST BEFORE HER LAB APPOINTMENT. WILL PT.'S LAB BE DRAWN FROM HER ARM? INFORMED PT. SHE IS SCHEDULED TO HAVE HER LAB DRAWN FROM HER ARM.

## 2015-05-08 ENCOUNTER — Telehealth: Payer: Self-pay | Admitting: Pharmacist

## 2015-05-08 ENCOUNTER — Other Ambulatory Visit: Payer: Self-pay | Admitting: *Deleted

## 2015-05-08 ENCOUNTER — Encounter: Payer: Self-pay | Admitting: Pharmacist

## 2015-05-08 DIAGNOSIS — Z5111 Encounter for antineoplastic chemotherapy: Secondary | ICD-10-CM | POA: Insufficient documentation

## 2015-05-08 DIAGNOSIS — C8120 Mixed cellularity classical Hodgkin lymphoma, unspecified site: Secondary | ICD-10-CM

## 2015-05-08 NOTE — Telephone Encounter (Signed)
Called pt to see if [facility] had called to set up delivery for Procarbazine Rx. Pt stated she had not received a call yet from [facility] (Phone: [phone]). I called [facility] yesterday on 10/3 around 4:30pm and was told procarbazine Rx was ready to be shipped and patient would be called on 10/3.   This has not happened. I will call [facility] again to see what the issue is. I will also give patient the number for [facility] specialty so she can follow up as well.   Pt is currently on Day 7 of chlorambucil. Procarbazine should be taken in combination with chlorambucil Days 1-14 for current regimen. Once pt receives she will only take procarbazine this cycle through Day14.  Thank you,  Montel Clock, PharmD, [facility]

## 2015-05-09 ENCOUNTER — Telehealth: Payer: Self-pay | Admitting: Oncology

## 2015-05-09 ENCOUNTER — Ambulatory Visit (HOSPITAL_BASED_OUTPATIENT_CLINIC_OR_DEPARTMENT_OTHER): Payer: Medicare Other | Admitting: Oncology

## 2015-05-09 ENCOUNTER — Telehealth: Payer: Self-pay | Admitting: *Deleted

## 2015-05-09 ENCOUNTER — Other Ambulatory Visit: Payer: Self-pay | Admitting: *Deleted

## 2015-05-09 ENCOUNTER — Other Ambulatory Visit (HOSPITAL_BASED_OUTPATIENT_CLINIC_OR_DEPARTMENT_OTHER): Payer: Medicare Other

## 2015-05-09 ENCOUNTER — Ambulatory Visit (HOSPITAL_BASED_OUTPATIENT_CLINIC_OR_DEPARTMENT_OTHER): Payer: Medicare Other

## 2015-05-09 VITALS — BP 149/72 | HR 62 | Temp 97.8°F | Resp 17 | Ht 66.0 in | Wt 169.4 lb

## 2015-05-09 DIAGNOSIS — D638 Anemia in other chronic diseases classified elsewhere: Secondary | ICD-10-CM

## 2015-05-09 DIAGNOSIS — Z5111 Encounter for antineoplastic chemotherapy: Secondary | ICD-10-CM

## 2015-05-09 DIAGNOSIS — C8128 Mixed cellularity classical Hodgkin lymphoma, lymph nodes of multiple sites: Secondary | ICD-10-CM | POA: Diagnosis not present

## 2015-05-09 DIAGNOSIS — C8120 Mixed cellularity classical Hodgkin lymphoma, unspecified site: Secondary | ICD-10-CM

## 2015-05-09 LAB — CBC WITH DIFFERENTIAL/PLATELET
BASO%: 0 % (ref 0.0–2.0)
BASOS ABS: 0 10*3/uL (ref 0.0–0.1)
EOS%: 0 % (ref 0.0–7.0)
Eosinophils Absolute: 0 10*3/uL (ref 0.0–0.5)
HCT: 34.2 % — ABNORMAL LOW (ref 34.8–46.6)
HEMOGLOBIN: 10.7 g/dL — AB (ref 11.6–15.9)
LYMPH#: 0.5 10*3/uL — AB (ref 0.9–3.3)
LYMPH%: 7 % — AB (ref 14.0–49.7)
MCH: 27.7 pg (ref 25.1–34.0)
MCHC: 31.3 g/dL — ABNORMAL LOW (ref 31.5–36.0)
MCV: 88.6 fL (ref 79.5–101.0)
MONO#: 0.1 10*3/uL (ref 0.1–0.9)
MONO%: 1.5 % (ref 0.0–14.0)
NEUT#: 6.2 10*3/uL (ref 1.5–6.5)
NEUT%: 91.5 % — ABNORMAL HIGH (ref 38.4–76.8)
Platelets: 306 10*3/uL (ref 145–400)
RBC: 3.86 10*6/uL (ref 3.70–5.45)
RDW: 22.2 % — ABNORMAL HIGH (ref 11.2–14.5)
WBC: 6.7 10*3/uL (ref 3.9–10.3)

## 2015-05-09 LAB — COMPREHENSIVE METABOLIC PANEL (CC13)
ALBUMIN: 3.4 g/dL — AB (ref 3.5–5.0)
ALT: 26 U/L (ref 0–55)
AST: 20 U/L (ref 5–34)
Alkaline Phosphatase: 57 U/L (ref 40–150)
Anion Gap: 6 mEq/L (ref 3–11)
BUN: 27.6 mg/dL — AB (ref 7.0–26.0)
CHLORIDE: 92 meq/L — AB (ref 98–109)
CO2: 32 meq/L — AB (ref 22–29)
Calcium: 9.5 mg/dL (ref 8.4–10.4)
Creatinine: 0.8 mg/dL (ref 0.6–1.1)
EGFR: 74 mL/min/{1.73_m2} — AB (ref >90)
GLUCOSE: 90 mg/dL (ref 70–140)
POTASSIUM: 4.7 meq/L (ref 3.5–5.1)
SODIUM: 130 meq/L — AB (ref 136–145)
Total Bilirubin: 0.33 mg/dL (ref 0.20–1.20)
Total Protein: 6.9 g/dL (ref 6.4–8.3)

## 2015-05-09 MED ORDER — HEPARIN SOD (PORK) LOCK FLUSH 100 UNIT/ML IV SOLN
500.0000 [IU] | Freq: Once | INTRAVENOUS | Status: AC | PRN
  Administered 2015-05-09: 500 [IU]
  Filled 2015-05-09: qty 5

## 2015-05-09 MED ORDER — SODIUM CHLORIDE 0.9 % IJ SOLN
10.0000 mL | INTRAMUSCULAR | Status: DC | PRN
  Administered 2015-05-09: 10 mL
  Filled 2015-05-09: qty 10

## 2015-05-09 MED ORDER — VINBLASTINE SULFATE CHEMO INJECTION 1 MG/ML
4.0000 mg/m2 | Freq: Once | INTRAVENOUS | Status: AC
  Administered 2015-05-09: 7.5 mg via INTRAVENOUS
  Filled 2015-05-09: qty 7.5

## 2015-05-09 MED ORDER — ALPRAZOLAM 0.5 MG PO TABS: 0.5000 mg | ORAL_TABLET | Freq: Four times a day (QID) | ORAL | Status: DC | PRN

## 2015-05-09 MED ORDER — SODIUM CHLORIDE 0.9 % IV SOLN
Freq: Once | INTRAVENOUS | Status: AC
  Administered 2015-05-09: 14:00:00 via INTRAVENOUS
  Filled 2015-05-09: qty 4

## 2015-05-09 MED ORDER — SODIUM CHLORIDE 0.9 % IV SOLN
Freq: Once | INTRAVENOUS | Status: AC
  Administered 2015-05-09: 13:00:00 via INTRAVENOUS

## 2015-05-09 NOTE — Progress Notes (Signed)
  [facility] OFFICE PROGRESS NOTE   Diagnosis: Hodgkin's lymphoma  INTERVAL HISTORY:   Ms. [name] [name] returns as scheduled. She began treatment with chlorambucil, prednisone, and vinblastine on 05/02/2015. No nausea or mouth sores. She complains of constipation. She began MiraLAX this week. She continues to have a low-grade fever, but no longer has chills. She complains of insomnia and "tremors "since beginning prednisone. She reports anxiety with the prednisone, complicated by the recent deaths of 2 family members.  Objective:  Vital signs in last 24 hours:  Blood pressure 149/72, pulse 62, temperature 97.8 F (36.6 C), temperature source Oral, resp. rate 17, height 5\' 6"  (1.676 m), weight 169 lb 6.4 oz (76.839 kg), SpO2 100 %.    HEENT: No thrush or ulcers Resp: Lungs clear bilaterally Cardio: Regular rate and rhythm GI: No hepatosplenomegaly, nontender Vascular: No leg edema   Portacath/PICC-without erythema, ecchymosis superior to the Port-A-Cath  Lab Results:  Lab Results  Component Value Date   WBC 6.7 05/09/2015   HGB 10.7* 05/09/2015   HCT 34.2* 05/09/2015   MCV 88.6 05/09/2015   PLT 306 05/09/2015   NEUTROABS 6.2 05/09/2015     Medications: I have reviewed the patient's current medications.  Assessment/Plan: 1. Hodgkin's lymphoma-mixed cellularity type, clinical stage IVb  Staging PET scan 04/13/2015 consistent with hypermetabolic lymphadenopathy in the neck, chest, and upper abdomen. There is masslike hypermetabolic activity in the left lung, probable lymphoma lesion in the left issue him and right hepatic lobe.  Cycle 1 CVPP initiated 05/02/2015 (procarbazine delayed until 05/10/2015 secondary to late pharmacy delivery   2. Fever/chills/night sweats secondary to #1, improved with Decadron  3. Anemia secondary to chronic disease  4. COPD  5. History of fibrillation    Disposition:  [name] [name] appears to be tolerating the  chemotherapy well. The plan is to proceed with day 8 vinblastine today. She will complete the course of oral chemotherapy on 05/15/2015. She will return for a nadir CBC on 05/21/2015 and an office visit prior to the next cycle of chemotherapy 05/30/2015.  We will maintain prednisone at the current dose for now. She will increase the Xanax to 0.5 mg every 6 hours as needed.  Betsy Coder, MD  05/09/2015  11:29 AM

## 2015-05-09 NOTE — Telephone Encounter (Signed)
Per staff message and POF I have scheduled appts. Advised scheduler of appts. JMW  

## 2015-05-09 NOTE — Telephone Encounter (Signed)
thanks

## 2015-05-09 NOTE — Telephone Encounter (Signed)
lvm for pt regarding to OCT appt..... °

## 2015-05-09 NOTE — Patient Instructions (Signed)
[facility] Discharge Instructions for Patients Receiving Chemotherapy  Today you received the following chemotherapy agents vinblastine  To help prevent nausea and vomiting after your treatment, we encourage you to take your nausea medication    If you develop nausea and vomiting that is not controlled by your nausea medication, call the clinic.   BELOW ARE SYMPTOMS THAT SHOULD BE REPORTED IMMEDIATELY:  *FEVER GREATER THAN 100.5 F  *CHILLS WITH OR WITHOUT FEVER  NAUSEA AND VOMITING THAT IS NOT CONTROLLED WITH YOUR NAUSEA MEDICATION  *UNUSUAL SHORTNESS OF BREATH  *UNUSUAL BRUISING OR BLEEDING  TENDERNESS IN MOUTH AND THROAT WITH OR WITHOUT PRESENCE OF ULCERS  *URINARY PROBLEMS  *BOWEL PROBLEMS  UNUSUAL RASH Items with * indicate a potential emergency and should be followed up as soon as possible.  Feel free to call the clinic you have any questions or concerns. The clinic phone number is [phone].  Please show the [place] at check-in to the Emergency Department and triage nurse.

## 2015-05-09 NOTE — Telephone Encounter (Signed)
Oral Chemotherapy Rx Encounter  Pt seen in infusion room for Day 8 Vinblastine. Pt seen with her daughter. Pt is doing well. She has been frustrated with the process of specialty pharmacy with [facility] as it has been 1 week since she started chlorambucil (which was filled by [facility]). But the procarbazine required specialty pharmacy services through [facility].   Procarbazine has been approved and will be delivered on Thursday 05/10/15. Ms. Mckain will start procarbazine on Thursday 10/6 and continue with chlorambucil until Tuesday 10/11 (day 14). She will then have 2 weeks off and restart both medications on Day 1 of next cycle.  All questions answered. Pt is storing chlorambucil in the refrigerator. Counseled pt on both prescriptions. Dosing, administration, safe handling, side effects including but not limited to: fatigue, weight loss, nausea, vomiting, diarrhea, rash, myelosuppression.  Follow up with patient in one week to evaluate adherence and toxicity.   Thank you,  Montel Clock, PharmD, [facility]

## 2015-05-11 ENCOUNTER — Telehealth: Payer: Self-pay | Admitting: Medical Oncology

## 2015-05-11 NOTE — Telephone Encounter (Signed)
Reports sore throat and tongue looks a little white. She got some chloraseptic lonzenges.

## 2015-05-11 NOTE — Telephone Encounter (Signed)
Per Dr Benay Spice I told pt to watch for white patches in mouth . I called her back and she is having terrible time with constipation. Her last normal BM was 5 days ago.She had a small stool today . She denies abdominal pain. Per Dr Benay Spice I instructed her to take 1/2 bottle of mag citrate tonight and repeat 1/2 bottle in am. I also instructd pt to call back if this is not effective .

## 2015-05-15 ENCOUNTER — Telehealth: Payer: Self-pay | Admitting: *Deleted

## 2015-05-15 NOTE — Telephone Encounter (Signed)
Pt left message and would like to know when she can start her prednisone, chlorambucil, and procarbazine. Message routed to Dr. Benay Spice.

## 2015-05-15 NOTE — Telephone Encounter (Signed)
She should have finished first cycle, please check dates and call her tomorrow Should not start again until next visit

## 2015-05-15 NOTE — Telephone Encounter (Signed)
Pt verbalizes today is the last day of her cycle of these medications, and knows when to f/u and keep next appts

## 2015-05-16 ENCOUNTER — Encounter: Payer: Self-pay | Admitting: Pharmacist

## 2015-05-16 NOTE — Progress Notes (Signed)
Oral Chemotherapy Follow-Up   Original Start date of oral chemotherapy: _9/28/16 (chlorambucil) and 05/10/15 (procarbazine)__  Pt is doing well today  Called patient today to follow up regarding patient's oral chemotherapy medication. Cycle 1 completed yesterday 10/11. Pt will now be off these two medications until cycle 2 on 05/30/15. We will make sure pt has refills sent to [facility] long for chlorambucil before this date. Procarbazine is delivered from [facility]. Pt will have ~ 1 week supply left so we have some buffer to account for shipping and delivery.    Pt reports __0__ tablets/doses missed in the last week/month.    Pt reports the following side effects: _Fatigue. Pt is resting and in bed today. This could potentially be from the chemo. Pt states she feels this way from time to time___  Other Issues: __none___   Will follow up and call patient again in ___2 weeks with start of next cycle______   Thank you,  Montel Clock, PharmD, [facility]

## 2015-05-26 ENCOUNTER — Other Ambulatory Visit: Payer: Self-pay | Admitting: Oncology

## 2015-05-28 ENCOUNTER — Other Ambulatory Visit: Payer: Self-pay | Admitting: Oncology

## 2015-05-28 DIAGNOSIS — C8120 Mixed cellularity classical Hodgkin lymphoma, unspecified site: Secondary | ICD-10-CM

## 2015-05-28 MED ORDER — CHLORAMBUCIL 2 MG PO TABS: ORAL_TABLET | ORAL | Status: AC

## 2015-05-28 MED ORDER — PROCARBAZINE HCL 50 MG PO CAPS: ORAL_CAPSULE | ORAL | Status: AC

## 2015-05-28 MED ORDER — PROCARBAZINE HCL 50 MG PO CAPS: ORAL_CAPSULE | ORAL | Status: DC

## 2015-05-30 ENCOUNTER — Other Ambulatory Visit (HOSPITAL_BASED_OUTPATIENT_CLINIC_OR_DEPARTMENT_OTHER): Payer: Medicare Other

## 2015-05-30 ENCOUNTER — Telehealth: Payer: Self-pay | Admitting: Oncology

## 2015-05-30 ENCOUNTER — Ambulatory Visit (HOSPITAL_BASED_OUTPATIENT_CLINIC_OR_DEPARTMENT_OTHER): Payer: Medicare Other | Admitting: Oncology

## 2015-05-30 ENCOUNTER — Telehealth: Payer: Self-pay | Admitting: *Deleted

## 2015-05-30 ENCOUNTER — Ambulatory Visit (HOSPITAL_BASED_OUTPATIENT_CLINIC_OR_DEPARTMENT_OTHER): Payer: Medicare Other

## 2015-05-30 VITALS — BP 106/66 | HR 81 | Temp 98.6°F | Resp 17 | Ht 66.0 in | Wt 174.4 lb

## 2015-05-30 DIAGNOSIS — C8120 Mixed cellularity classical Hodgkin lymphoma, unspecified site: Secondary | ICD-10-CM

## 2015-05-30 DIAGNOSIS — D630 Anemia in neoplastic disease: Secondary | ICD-10-CM

## 2015-05-30 DIAGNOSIS — C8128 Mixed cellularity classical Hodgkin lymphoma, lymph nodes of multiple sites: Secondary | ICD-10-CM

## 2015-05-30 DIAGNOSIS — Z5111 Encounter for antineoplastic chemotherapy: Secondary | ICD-10-CM | POA: Diagnosis not present

## 2015-05-30 DIAGNOSIS — Z23 Encounter for immunization: Secondary | ICD-10-CM | POA: Diagnosis not present

## 2015-05-30 LAB — CBC WITH DIFFERENTIAL/PLATELET
BASO%: 0.5 % (ref 0.0–2.0)
BASOS ABS: 0 10*3/uL (ref 0.0–0.1)
EOS ABS: 0.1 10*3/uL (ref 0.0–0.5)
EOS%: 2 % (ref 0.0–7.0)
HCT: 31.6 % — ABNORMAL LOW (ref 34.8–46.6)
HGB: 9.9 g/dL — ABNORMAL LOW (ref 11.6–15.9)
LYMPH%: 10.1 % — AB (ref 14.0–49.7)
MCH: 28.9 pg (ref 25.1–34.0)
MCHC: 31.3 g/dL — AB (ref 31.5–36.0)
MCV: 92.1 fL (ref 79.5–101.0)
MONO#: 0.8 10*3/uL (ref 0.1–0.9)
MONO%: 14 % (ref 0.0–14.0)
NEUT#: 4.4 10*3/uL (ref 1.5–6.5)
NEUT%: 73.4 % (ref 38.4–76.8)
Platelets: 157 10*3/uL (ref 145–400)
RBC: 3.43 10*6/uL — AB (ref 3.70–5.45)
RDW: 24.4 % — ABNORMAL HIGH (ref 11.2–14.5)
WBC: 5.9 10*3/uL (ref 3.9–10.3)
lymph#: 0.6 10*3/uL — ABNORMAL LOW (ref 0.9–3.3)

## 2015-05-30 LAB — COMPREHENSIVE METABOLIC PANEL (CC13)
ALK PHOS: 60 U/L (ref 40–150)
ALT: 22 U/L (ref 0–55)
AST: 25 U/L (ref 5–34)
Albumin: 3.1 g/dL — ABNORMAL LOW (ref 3.5–5.0)
Anion Gap: 8 mEq/L (ref 3–11)
BUN: 17.9 mg/dL (ref 7.0–26.0)
CO2: 28 meq/L (ref 22–29)
Calcium: 9.3 mg/dL (ref 8.4–10.4)
Chloride: 102 mEq/L (ref 98–109)
Creatinine: 0.8 mg/dL (ref 0.6–1.1)
EGFR: 71 mL/min/{1.73_m2} — ABNORMAL LOW (ref >90)
GLUCOSE: 91 mg/dL (ref 70–140)
POTASSIUM: 3.6 meq/L (ref 3.5–5.1)
SODIUM: 138 meq/L (ref 136–145)
Total Bilirubin: <0.3 mg/dL (ref 0.20–1.20)
Total Protein: 6.6 g/dL (ref 6.4–8.3)

## 2015-05-30 LAB — SEDIMENTATION RATE: SED RATE: 55 mm/h — AB (ref 0–30)

## 2015-05-30 MED ORDER — SODIUM CHLORIDE 0.9 % IJ SOLN
10.0000 mL | INTRAMUSCULAR | Status: DC | PRN
  Administered 2015-05-30: 10 mL
  Filled 2015-05-30: qty 10

## 2015-05-30 MED ORDER — HEPARIN SOD (PORK) LOCK FLUSH 100 UNIT/ML IV SOLN
500.0000 [IU] | Freq: Once | INTRAVENOUS | Status: AC | PRN
  Administered 2015-05-30: 500 [IU]
  Filled 2015-05-30: qty 5

## 2015-05-30 MED ORDER — SODIUM CHLORIDE 0.9 % IV SOLN
Freq: Once | INTRAVENOUS | Status: AC
  Administered 2015-05-30: 11:00:00 via INTRAVENOUS
  Filled 2015-05-30: qty 4

## 2015-05-30 MED ORDER — SODIUM CHLORIDE 0.9 % IV SOLN
Freq: Once | INTRAVENOUS | Status: AC
  Administered 2015-05-30: 10:00:00 via INTRAVENOUS

## 2015-05-30 MED ORDER — VINBLASTINE SULFATE CHEMO INJECTION 1 MG/ML
4.0000 mg/m2 | Freq: Once | INTRAVENOUS | Status: AC
  Administered 2015-05-30: 7.5 mg via INTRAVENOUS
  Filled 2015-05-30: qty 7.5

## 2015-05-30 MED ORDER — INFLUENZA VAC SPLIT QUAD 0.5 ML IM SUSY
0.5000 mL | PREFILLED_SYRINGE | Freq: Once | INTRAMUSCULAR | Status: AC
  Administered 2015-05-30: 0.5 mL via INTRAMUSCULAR
  Filled 2015-05-30: qty 0.5

## 2015-05-30 NOTE — Patient Instructions (Signed)
[facility] Discharge Instructions for Patients Receiving Chemotherapy  Today you received the following chemotherapy agents vinblastine  To help prevent nausea and vomiting after your treatment, we encourage you to take your nausea medication    If you develop nausea and vomiting that is not controlled by your nausea medication, call the clinic.   BELOW ARE SYMPTOMS THAT SHOULD BE REPORTED IMMEDIATELY:  *FEVER GREATER THAN 100.5 F  *CHILLS WITH OR WITHOUT FEVER  NAUSEA AND VOMITING THAT IS NOT CONTROLLED WITH YOUR NAUSEA MEDICATION  *UNUSUAL SHORTNESS OF BREATH  *UNUSUAL BRUISING OR BLEEDING  TENDERNESS IN MOUTH AND THROAT WITH OR WITHOUT PRESENCE OF ULCERS  *URINARY PROBLEMS  *BOWEL PROBLEMS  UNUSUAL RASH Items with * indicate a potential emergency and should be followed up as soon as possible.  Feel free to call the clinic you have any questions or concerns. The clinic phone number is [phone].  Please show the [place] at check-in to the Emergency Department and triage nurse.

## 2015-05-30 NOTE — Telephone Encounter (Signed)
sent MW email to sch pt trmt per pof-pt to get updated sch b4 leaving**

## 2015-05-30 NOTE — Telephone Encounter (Signed)
cld & spoke to pt and gave appt time *date for 11/2

## 2015-05-30 NOTE — Telephone Encounter (Signed)
per pof to sch pt appt-gave pt copy of avs °

## 2015-05-30 NOTE — Progress Notes (Signed)
  [name] OFFICE PROGRESS NOTE   Diagnosis: Hodgkin's lymphoma  INTERVAL HISTORY:   Ms. [name] [name] began cycle 1 chemotherapy 05/02/2015. No nausea/vomiting. Small "sores "at the outer lips, but no mouth ulcers. She had an episode of night sweats swelling this week, but in general the fever and sweats have resolved. She continues to report malaise. No change in baseline neuropathy symptoms.  Objective:  Vital signs in last 24 hours:  Blood pressure 106/66, pulse 81, temperature 98.6 F (37 C), temperature source Oral, resp. rate 17, height 5\' 6"  (1.676 m), weight 174 lb 6.4 oz (79.107 kg), SpO2 94 %.    HEENT: No thrush or ulcers Lymphatics: No cervical, supraclavicular, or left axillary nodes Resp: Lungs clear bilaterally Cardio: Regular rate and rhythm GI: No hepatosplenomegaly, nontender Vascular: No leg edema   Portacath/PICC-without erythema  Lab Results:  Lab Results  Component Value Date   WBC 5.9 05/30/2015   HGB 9.9* 05/30/2015   HCT 31.6* 05/30/2015   MCV 92.1 05/30/2015   PLT 157 05/30/2015   NEUTROABS 4.4 05/30/2015     Medications: I have reviewed the patient's current medications.  Assessment/Plan: 1. Hodgkin's lymphoma-mixed cellularity type, clinical stage IVb  Staging PET scan 04/13/2015 consistent with hypermetabolic lymphadenopathy in the neck, chest, and upper abdomen. There is masslike hypermetabolic activity in the left lung, probable lymphoma lesion in the left issue him and right hepatic lobe.  Cycle 1 CVPP chemotherapy beginning 05/02/2015 2. Fever/chills/night sweats secondary to #1, improved with Decadron  3. Anemia secondary to chronic disease  4. COPD  5. History of atrial fibrillation   Disposition:  [name] [name] tolerated the first cycle of chemotherapy well. There has been clinical improvement. She is no longer taking Decadron. The plan is to proceed with cycle 2 chemotherapy today. She will return for  day 8 chemotherapy in 1 week and a nadir CBC on 06/15/2015.  [name] [name] will be scheduled for an office visit and cycle 3 chemotherapy in 4 weeks. She will be referred for a restaging CT evaluation after cycle 3.  Betsy Coder, MD  05/30/2015  2:42 PM

## 2015-05-30 NOTE — Telephone Encounter (Signed)
Per staff message and POF I have scheduled appts. Advised scheduler of appts. JMW  

## 2015-05-31 ENCOUNTER — Telehealth: Payer: Self-pay

## 2015-06-01 ENCOUNTER — Telehealth: Payer: Self-pay | Admitting: Oncology

## 2015-06-01 ENCOUNTER — Telehealth: Payer: Self-pay | Admitting: *Deleted

## 2015-06-01 NOTE — Telephone Encounter (Signed)
Per pof pt passed....all appts cx

## 2015-06-01 NOTE — Telephone Encounter (Signed)
Received message from pt's daughter Gerri Spore that pt "passed away this morning in her apartment, thank Dr. Benay Spice for all he did"  Dr. Benay Spice notified.

## 2015-06-04 ENCOUNTER — Other Ambulatory Visit: Payer: Self-pay | Admitting: Oncology

## 2015-06-05 NOTE — Telephone Encounter (Signed)
Hadn't seen patient since 8/16) She will sign the death certificate, gave ok to release the body. Marland Kitchen

## 2015-06-05 NOTE — Telephone Encounter (Signed)
Call from [facility], they found patient in her apartment un-respondent, would Dr. Mariea Clonts sign the death certificate? Need order to release the body. Checked with Dr. Mariea Clonts (she hadn't seen patient since

## 2015-06-05 DEATH — deceased

## 2015-06-06 ENCOUNTER — Other Ambulatory Visit: Payer: Medicare Other

## 2015-06-06 ENCOUNTER — Ambulatory Visit: Payer: Medicare Other

## 2015-06-15 ENCOUNTER — Other Ambulatory Visit: Payer: Medicare Other

## 2015-06-20 ENCOUNTER — Encounter: Payer: Self-pay | Admitting: Hematology & Oncology

## 2015-06-27 ENCOUNTER — Ambulatory Visit: Payer: Medicare Other

## 2015-06-27 ENCOUNTER — Other Ambulatory Visit: Payer: Medicare Other

## 2015-07-04 ENCOUNTER — Other Ambulatory Visit: Payer: Medicare Other

## 2015-07-04 ENCOUNTER — Ambulatory Visit: Payer: Medicare Other | Admitting: Oncology

## 2015-07-04 ENCOUNTER — Ambulatory Visit: Payer: Medicare Other

## 2015-07-31 ENCOUNTER — Other Ambulatory Visit: Payer: Self-pay | Admitting: Nurse Practitioner

## 2015-12-04 IMAGING — CT CT CHEST W/O CM
2 of 3 series · 14 of 36 positions shown, 17 images · non-contrast
Comparison: Two-view chest x-ray yesterday and earlier. CT chest
11/27/2014, 05/08/2014, 05/30/2013.

CLINICAL DATA: Possible cavitary lesion in the right lung base on
chest x-ray earlier same date. Fever of unknown origin.

EXAM:
CT CHEST WITHOUT CONTRAST
TECHNIQUE: Multidetector CT imaging of the chest was performed following the
standard protocol without IV contrast.

[Series 2: thorax 5.0 i31f 1 · axial · 0.60mm/px · z∈[-615,-370]mm · 11 of 59 slices shown, 14 images]
[im 5/59  mediastinal]
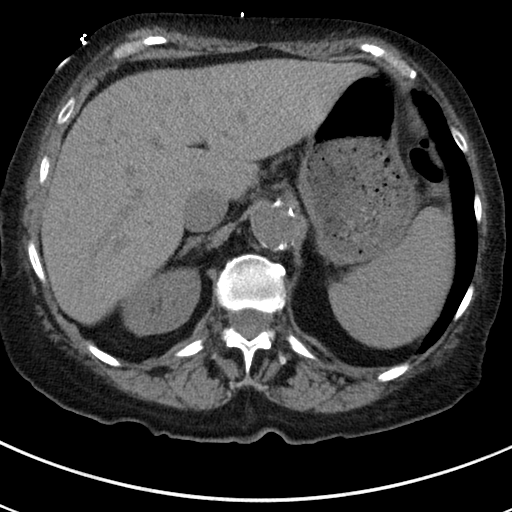
[im 5/59  lung]
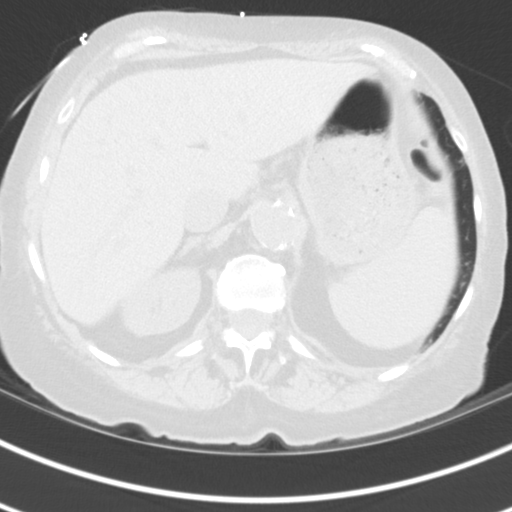
[im 9/59  lung]
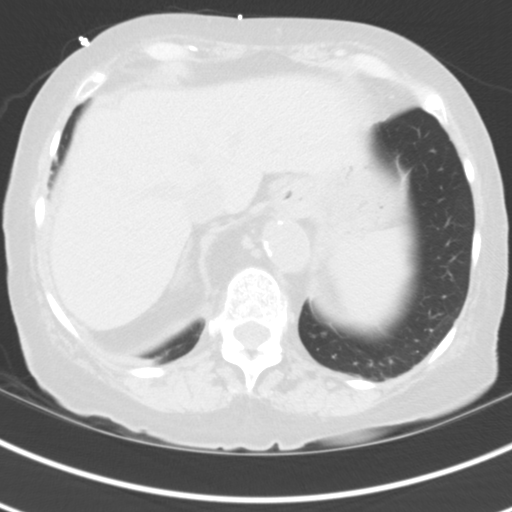
[im 13/59  lung]
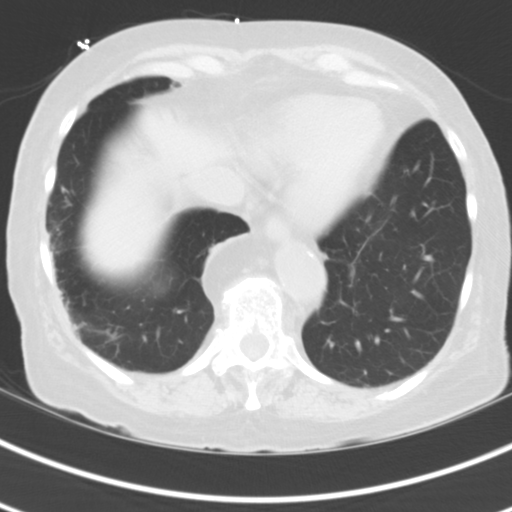
[im 20/59  lung]
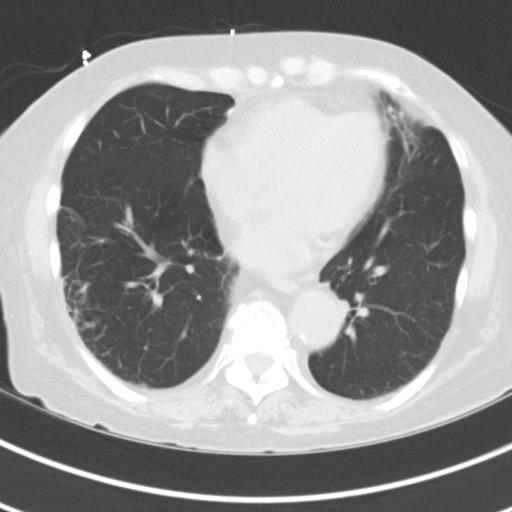
[im 24/59  mediastinal]
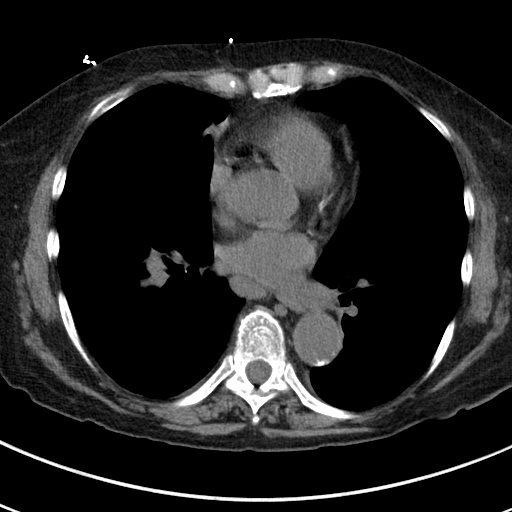
[im 24/59  lung]
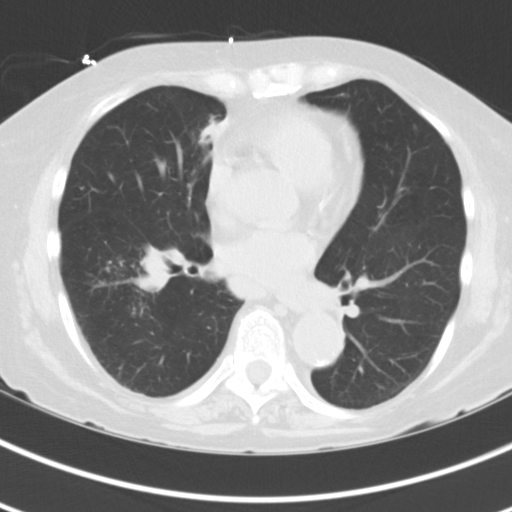
[im 31/59  lung]
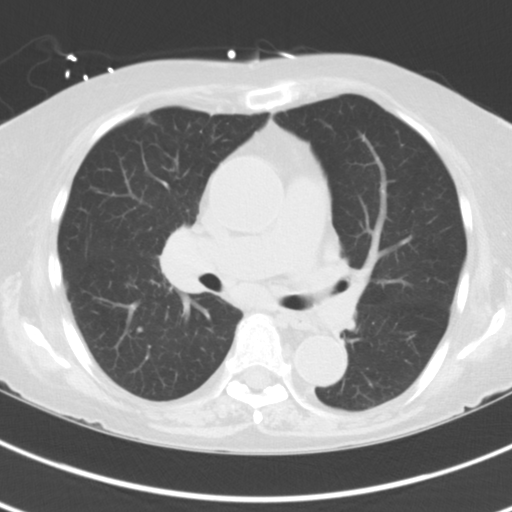
[im 35/59  lung]
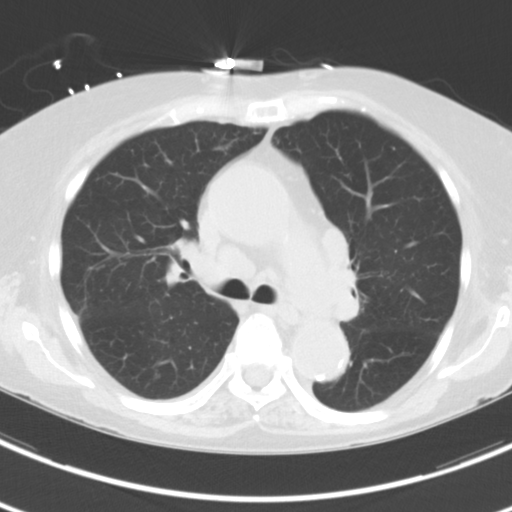
[im 39/59  lung]
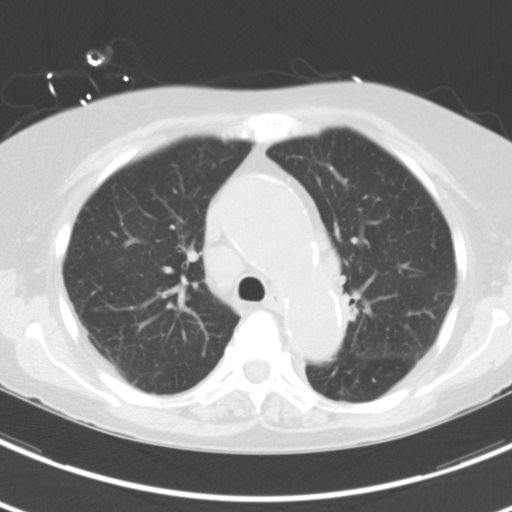
[im 46/59  mediastinal]
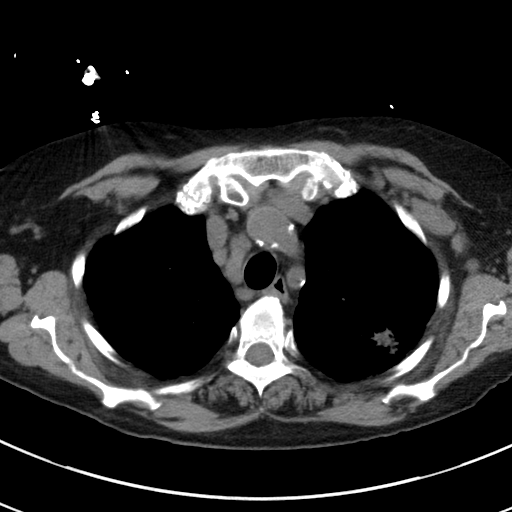
[im 46/59  lung]
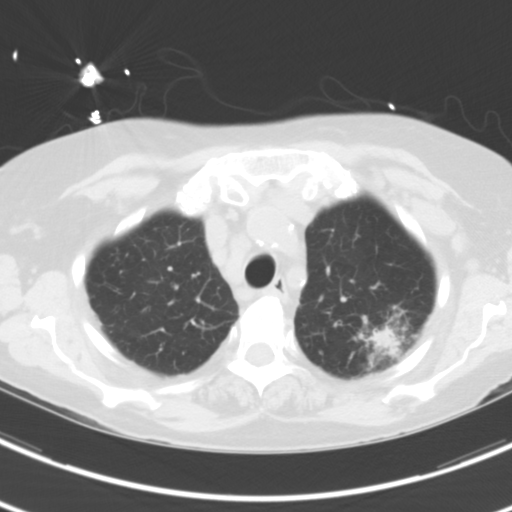
[im 50/59  lung]
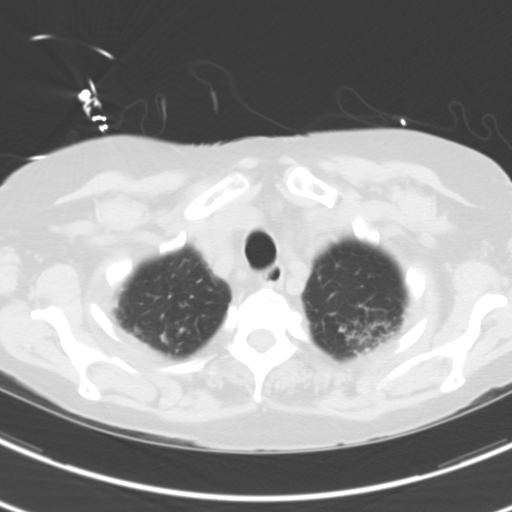
[im 54/59  lung]
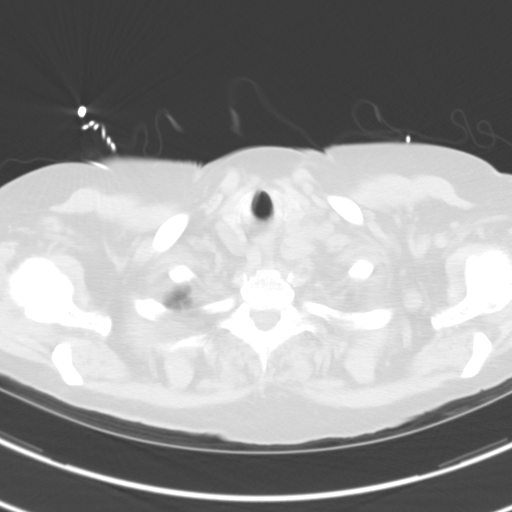

[Series 5: coronal · coronal · 0.59mm/px · 3 of 83 slices shown]
[im 17/83  lung]
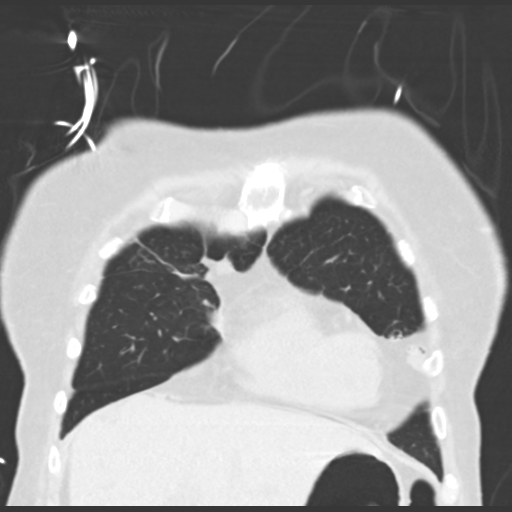
[im 33/83  lung]
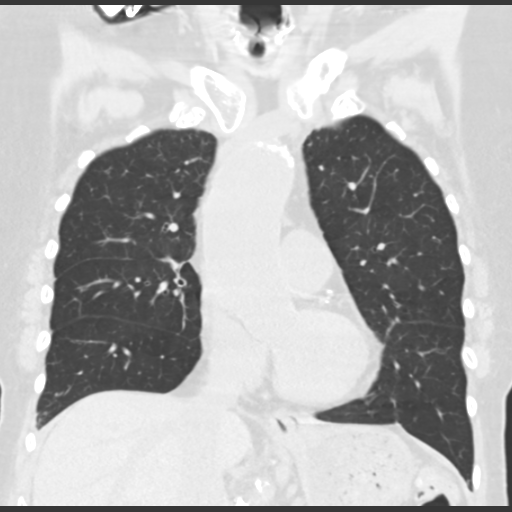
[im 50/83  lung]
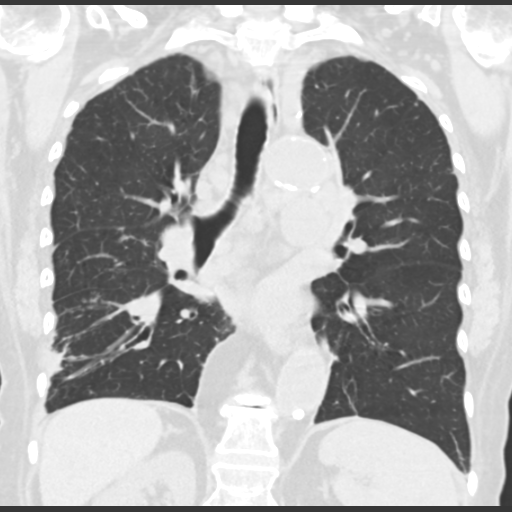

[14 of 36 positions shown; findings below may reference images not displayed]

FINDINGS: Lungs: Since the most recent prior CT, worsening of airspace
opacities in the peripheral left upper lobe, some of the opacities
now conglomerate and masslike in appearance. Interval improvement in
the airspace opacities in the peripheral right lower lobe,
associated with bronchiectasis and several mucous filled dilated
bronchi, associated with pleuroparenchymal scarring. Stable scar and
bronchiectasis in the right middle lobe and lingula. No evidence of
a right base cavitary lesion as questioned on yesterday's chest
x-ray. A bleb or air cyst in the right lower lobe likely accounts
for the chest x-ray finding.

Trachea/bronchi: Central airways patent with mild central bronchial
wall thickening. Bronchiectasis in the right lower lobe, right
middle lobe and lingula as noted above.

Pleura: Pleural thickening involving the right posterior and lateral
hemithorax, some what improved when compared to the most recent
prior CT. No pleural thickening on the left. No pleural effusions.

Mediastinum:  No mediastinal masses.

Heart and Vascular: Normal heart size. Moderate 3 vessel coronary
atherosclerosis. No pericardial effusion. Ascending thoracic aortic
aneurysm with maximum diameter approximately 4.8 cm, unchanged since
the most recent CT in November 2014, only slightly increased since the
May 2013 examination where it measured 4.5 cm.

Lymphatic: Interval marked progression of mediastinal
lymphadenopathy since the November 2014 CT. Enlarged nodes are present
in stations 2R, 3A, 4R, 5 and 7. Index low right paratracheal (4)
nodes measure 3.0 x 2.4 cm. Index AP window (station 5) nodes
measure 3.5 x 1.7 cm. Index subcarinal (station 7) nodes measure
x 3.6 cm. Index left upper mediastinal (station 3A) nodes measure
2.9 x 2.4 cm. Right paracardiac lymph node measures approximately
1.5 x 1.3 cm. Enlarged left axillary lymph nodes, an index node
measuring 1.6 x 3.2 cm.

Other findings: None.

Visualized lower neck: Thyroid gland unremarkable. Enlarged left
supraclavicular lymph nodes.

Visualized upper abdomen: Mildly enlarged retrocrural lymph nodes,
increased in size since the most recent prior CT. Small
gastrohepatic ligament lymph nodes, new. Visualized upper abdomen
otherwise unremarkable.

Musculoskeletal: Osseous demineralization. Multilevel degenerative
disc disease and spondylosis. No evidence of osseous metastatic
disease.
IMPRESSION: 1. Worsening airspace opacities in the left upper lobe, now
conglomerate and masslike, since the most recent prior CT from
November 2014. Opacity has been present in this region dating back to
May 2013. While this might represent an acute pneumonia,
adenocarcinoma (previously known as bronchoalveolar cell carcinoma)
can have this appearance, particularly given the presence over an
approximate 2 year interval.
2. Pathologic lymphadenopathy involving the mediastinum, left
axilla, and left supraclavicular region with enlarging lymph nodes
in the visualized upper abdomen since the November 2014 CT. Metastatic
disease or lymphoma/leukemia is suspected.
3. No evidence of cavitary lesion in the right base as questioned on
yesterday's chest x-ray. A bleb or air cyst in the right lower lobe
likely accounts for the chest x-ray finding.
4. Interval improvement in the pleuroparenchymal scar in the right
lower lobe since the November 2014 CT.
5. Scar and bronchiectasis is also present in the right middle lobe
and lingula, unchanged.

## 2015-12-06 IMAGING — CT CT BIOPSY
1 of 2 series · 15 of 32 positions shown, 19 images · non-contrast
Comparison: none

CLINICAL DATA: Anemia, fever and chills

EXAM:
CT GUIDED DEEP ILIAC BONE ASPIRATION AND CORE BIOPSY
TECHNIQUE: The procedure, risks (including but not limited to bleeding,
infection, organ damage ), benefits, and alternatives were explained
to the patient. Questions regarding the procedure were encouraged
and answered. The patient understands and consents to the procedure.
Patient was placed supine on the CT gantry and limited axial scans
through the pelvis were obtained. Appropriate skin entry site was
identified. Skin site was marked, prepped with Betadine, draped in
usual sterile fashion, and infiltrated locally with 1% lidocaine.

[Series 2: i-spiral 5.0 b40f · axial · 0.89mm/px · z∈[+955,+1081]mm · 15 of 40 slices shown, 19 images]
[im 2/40  soft-tissue]
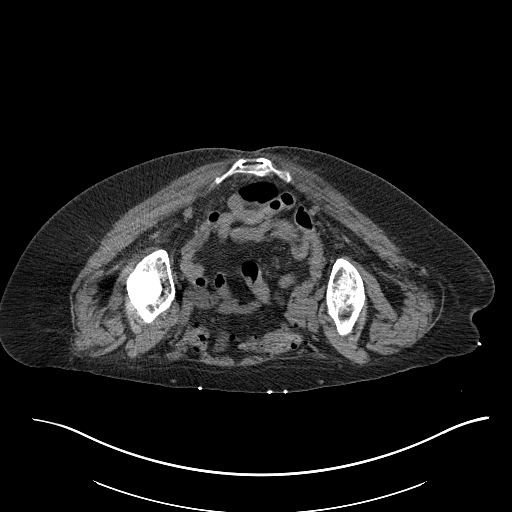
[im 2/40  bone]
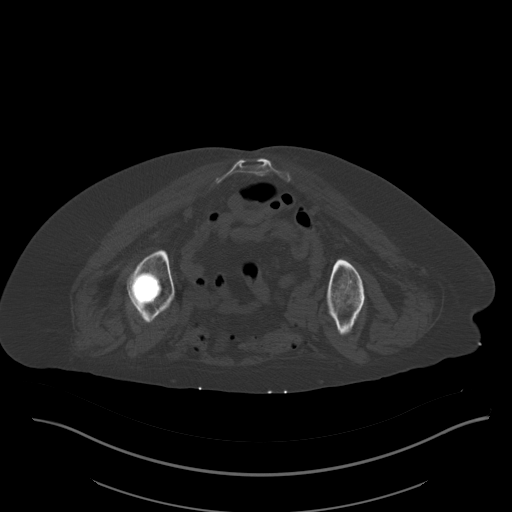
[im 5/40  soft-tissue]
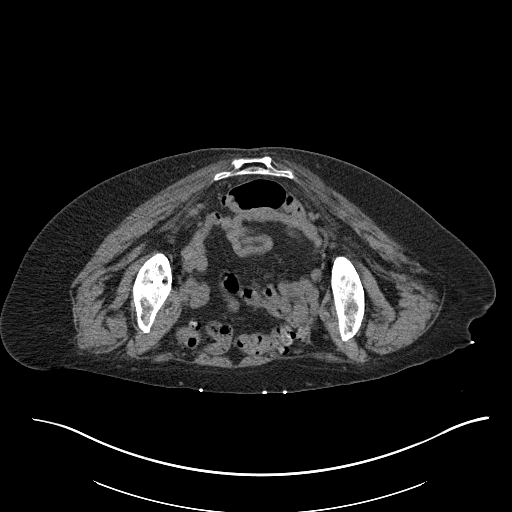
[im 9/40  soft-tissue]
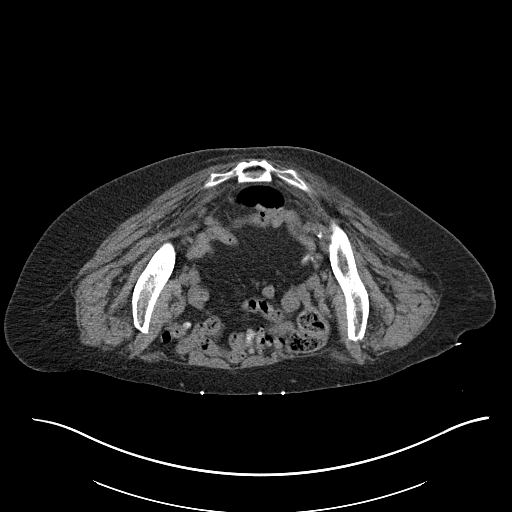
[im 12/40  soft-tissue]
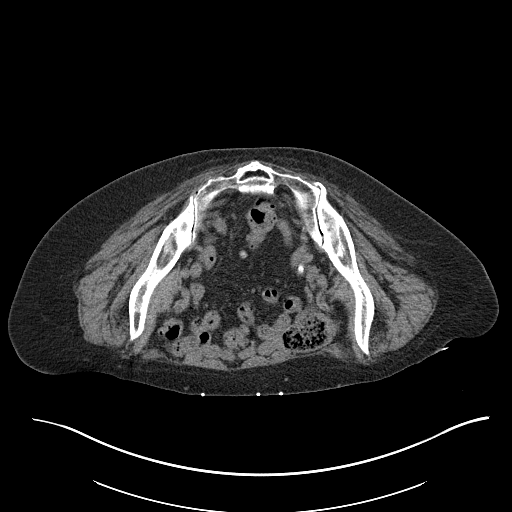
[im 14/40  soft-tissue]
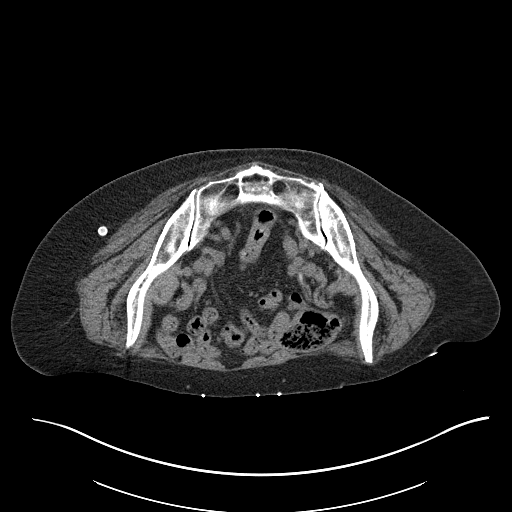
[im 17/40  soft-tissue]
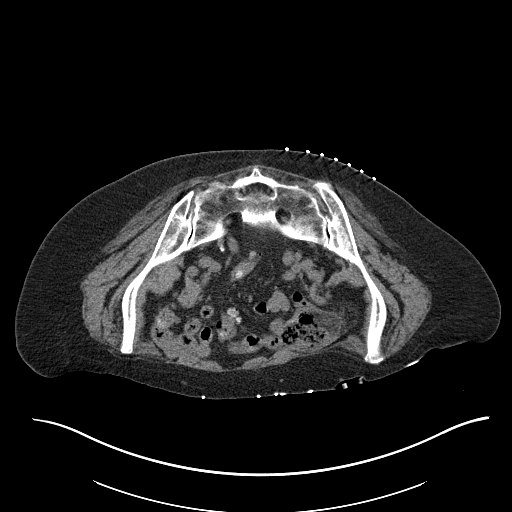
[im 20/40  soft-tissue]
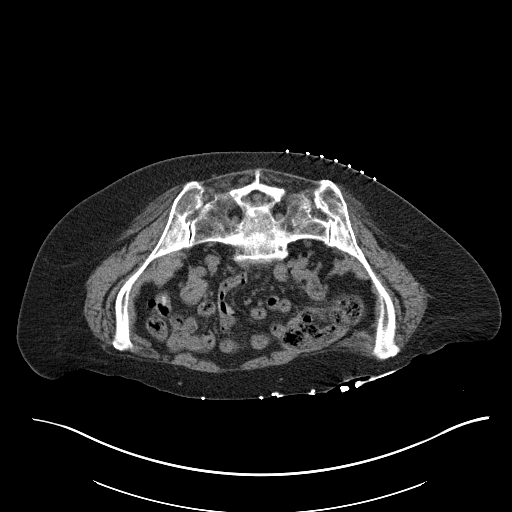
[im 23/40  soft-tissue]
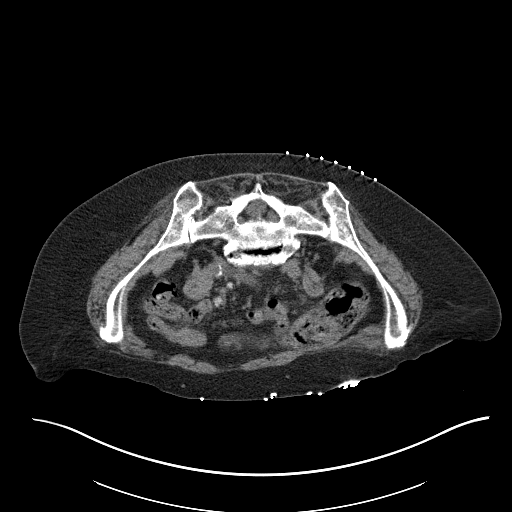
[im 27/40  soft-tissue]
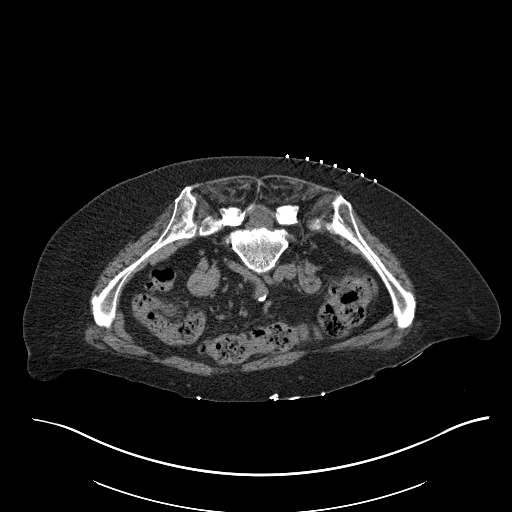
[im 27/40  bone]
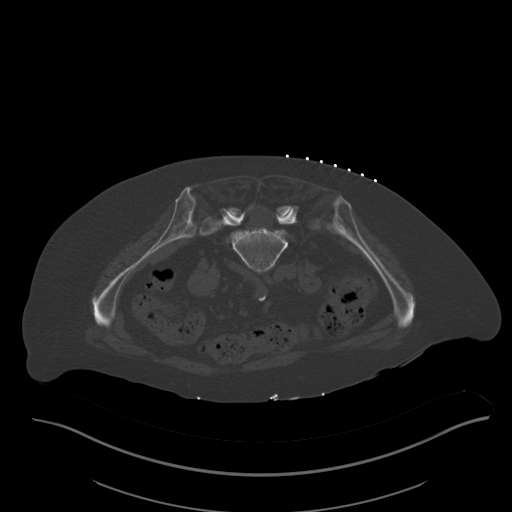
[im 28/40  soft-tissue]
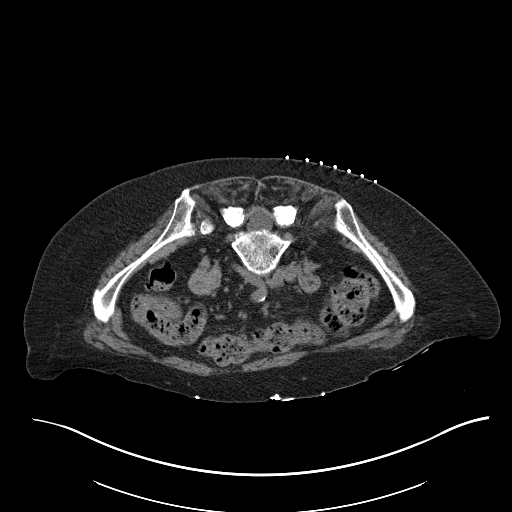
[im 31/40  soft-tissue]
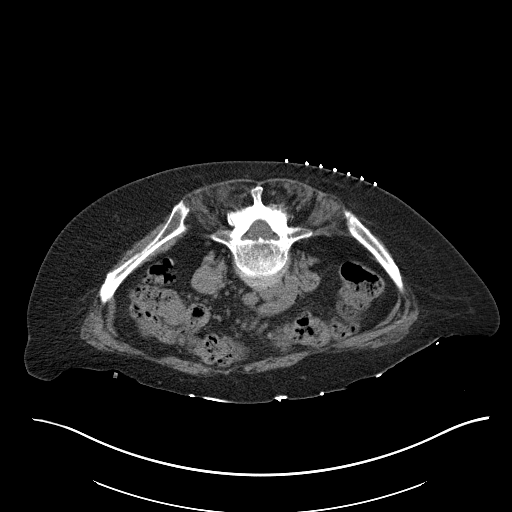
[im 33/40  lung]
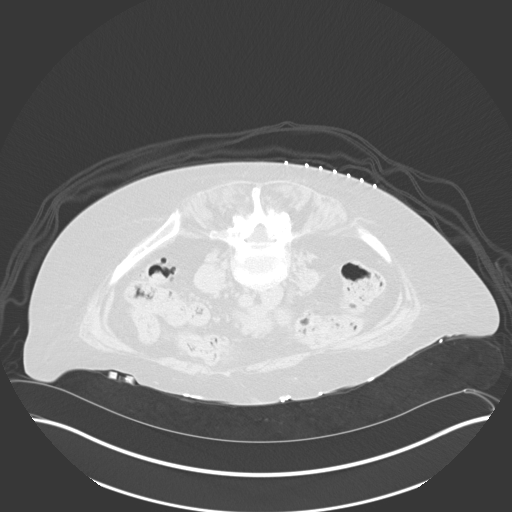
[im 35/40  soft-tissue]
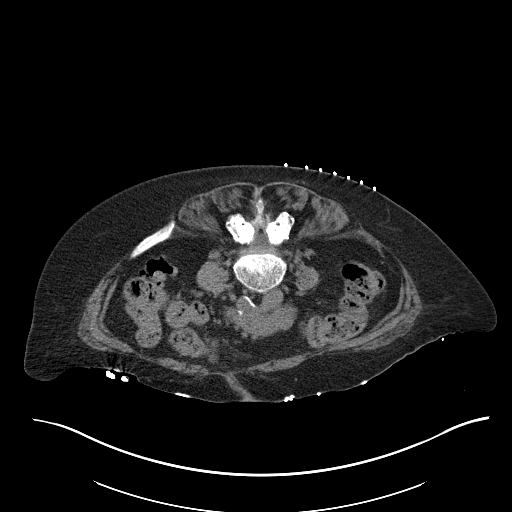
[im 35/40  lung]
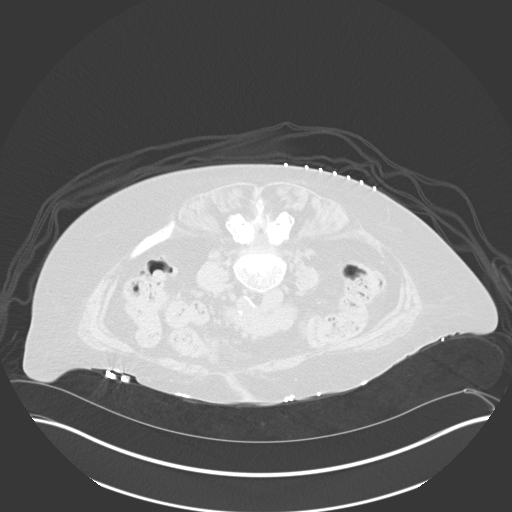
[im 36/40  lung]
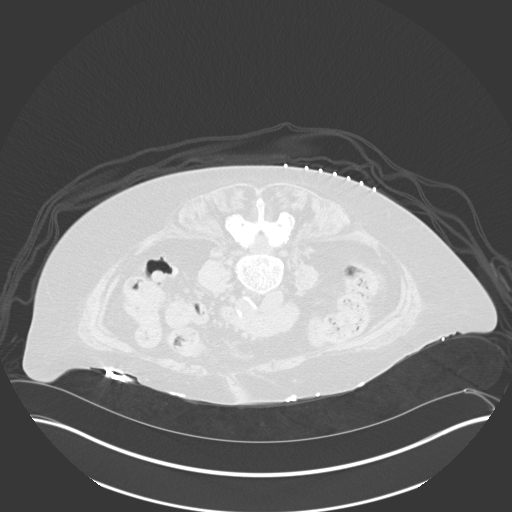
[im 38/40  soft-tissue]
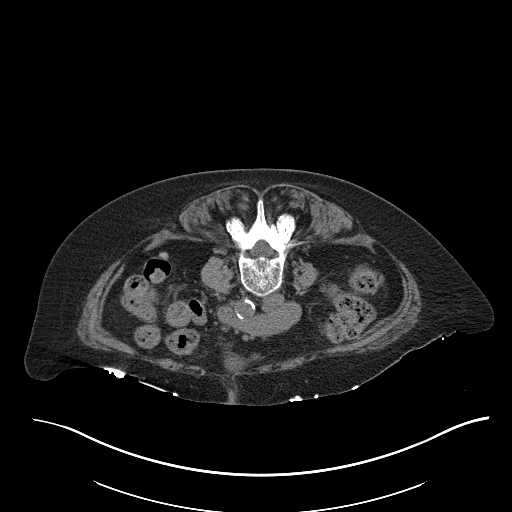
[im 38/40  lung]
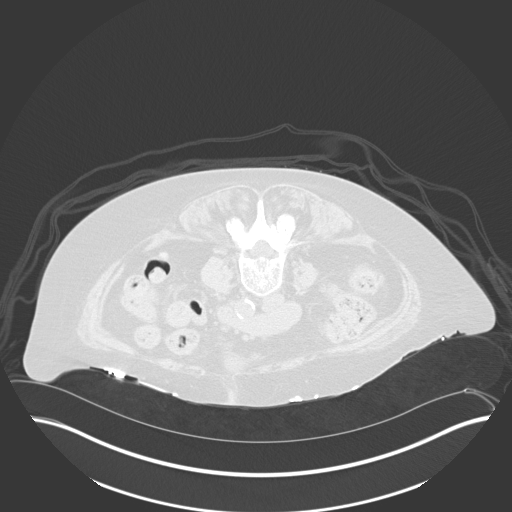

[15 of 32 positions shown; findings below may reference images not displayed]

Intravenous Fentanyl and Versed were administered as conscious
sedation during continuous cardiorespiratory monitoring by the
radiology RN, with a total moderate sedation time of 6 minutes.

Under CT fluoroscopic guidance an 11-gauge Cook trocar bone needle
was advanced into the right iliac bone just lateral to the
sacroiliac joint. Once needle tip position was confirmed, coaxial
core and aspiration samples were obtained. The final sample was
obtained using the guiding needle itself, which was then removed.
Post procedure scans show no hematoma or fracture. Patient tolerated
procedure well.

COMPLICATIONS:
COMPLICATIONS
none
IMPRESSION: 1. Technically successful CT guided right iliac bone core and
aspiration biopsy.

## 2023-01-21 ENCOUNTER — Other Ambulatory Visit: Payer: Self-pay
